# Patient Record
Sex: Male | Born: 1945
Health system: Southern US, Community
[De-identification: ages and names within clinical notes are randomized; demographics above are authoritative.]

## PROBLEM LIST (undated history)

## (undated) DIAGNOSIS — E119 Type 2 diabetes mellitus without complications: Secondary | ICD-10-CM

## (undated) DIAGNOSIS — I10 Essential (primary) hypertension: Secondary | ICD-10-CM

## (undated) DIAGNOSIS — E785 Hyperlipidemia, unspecified: Secondary | ICD-10-CM

## (undated) DIAGNOSIS — Z1211 Encounter for screening for malignant neoplasm of colon: Secondary | ICD-10-CM

## (undated) DIAGNOSIS — F329 Major depressive disorder, single episode, unspecified: Secondary | ICD-10-CM

## (undated) DIAGNOSIS — F32A Depression, unspecified: Secondary | ICD-10-CM

## (undated) DIAGNOSIS — E781 Pure hyperglyceridemia: Secondary | ICD-10-CM

## (undated) DIAGNOSIS — I4891 Unspecified atrial fibrillation: Secondary | ICD-10-CM

## (undated) DIAGNOSIS — Z8489 Family history of other specified conditions: Secondary | ICD-10-CM

## (undated) DIAGNOSIS — U071 COVID-19: Secondary | ICD-10-CM

## (undated) DIAGNOSIS — F419 Anxiety disorder, unspecified: Secondary | ICD-10-CM

## (undated) HISTORY — DX: Essential (primary) hypertension: I10

## (undated) HISTORY — DX: Major depressive disorder, single episode, unspecified: F32.9

## (undated) HISTORY — DX: Depression, unspecified: F32.A

## (undated) HISTORY — PX: APPENDECTOMY: SHX54

## (undated) HISTORY — DX: Type 2 diabetes mellitus without complications: E11.9

## (undated) HISTORY — DX: Pure hyperglyceridemia: E78.1

## (undated) HISTORY — DX: Anxiety disorder, unspecified: F41.9

---
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
## 2001-02-10 ENCOUNTER — Emergency Department (HOSPITAL_COMMUNITY): Admission: EM | Admit: 2001-02-10 | Discharge: 2001-02-10 | Payer: Self-pay | Admitting: Emergency Medicine

## 2004-01-11 ENCOUNTER — Ambulatory Visit (HOSPITAL_COMMUNITY): Admission: RE | Admit: 2004-01-11 | Discharge: 2004-01-11 | Payer: Self-pay | Admitting: Family Medicine

## 2004-01-14 HISTORY — PX: COLONOSCOPY: SHX174

## 2004-02-12 ENCOUNTER — Ambulatory Visit (HOSPITAL_COMMUNITY): Admission: RE | Admit: 2004-02-12 | Discharge: 2004-02-12 | Payer: Self-pay | Admitting: Internal Medicine

## 2004-02-15 ENCOUNTER — Ambulatory Visit (HOSPITAL_COMMUNITY): Admission: RE | Admit: 2004-02-15 | Discharge: 2004-02-15 | Payer: Self-pay | Admitting: Internal Medicine

## 2004-03-16 ENCOUNTER — Ambulatory Visit: Payer: Self-pay | Admitting: Gastroenterology

## 2013-07-09 ENCOUNTER — Other Ambulatory Visit: Payer: Self-pay

## 2013-07-09 ENCOUNTER — Telehealth: Payer: Self-pay

## 2013-07-09 DIAGNOSIS — Z1211 Encounter for screening for malignant neoplasm of colon: Secondary | ICD-10-CM

## 2013-07-09 NOTE — Telephone Encounter (Signed)
Pt will need an OV prior to colonoscopy due to his medications. I called and informed his wife and she will tell him.  His appt is scheduled for 07/25/2013 at 10:00 AM with Neil Crouch, PA.   I called and LMOM for Dorise Hiss at the [facility]  [phone]  X [phone]) that pt will require an office visit first.

## 2013-07-25 ENCOUNTER — Encounter: Payer: Self-pay | Admitting: Gastroenterology

## 2013-07-25 ENCOUNTER — Ambulatory Visit (INDEPENDENT_AMBULATORY_CARE_PROVIDER_SITE_OTHER): Payer: Medicare Other | Admitting: Gastroenterology

## 2013-07-25 VITALS — BP 147/71 | HR 67 | Temp 95.4°F | Ht 71.0 in | Wt 189.8 lb

## 2013-07-25 DIAGNOSIS — Z1211 Encounter for screening for malignant neoplasm of colon: Secondary | ICD-10-CM

## 2013-07-25 NOTE — Progress Notes (Signed)
Primary Care Physician:  Dr. Blane Ohara (through the [facility]) Primary Gastroenterologist:  Dr. Gala Romney   Chief Complaint  Patient presents with  . Colonoscopy    HPI:   Darrell Lang presents today to schedule routine screening colonoscopy. Last procedure in 2005 by Dr. Gala Romney without any polyps noted. Internal hemorrhoids appreciated. Patient has no lower or upper GI concerns. Denies abdominal pain, change in bowel habits, rectal bleeding, melena, dysphagia. Ready to proceed with routine screening colonoscopy. He is on multiple medications for history of anxiety and depression.   Past Medical History  Diagnosis Date  . Hypertension   . Hypertriglyceridemia   . Diabetes   . Anxiety and depression     Past Surgical History  Procedure Laterality Date  . Colonoscopy  Sept 2005    Dr. Gala Romney: internal hemorrhoids, otherwise normal rectum. Normal colon and normal TI  . Appendectomy      Current Outpatient Prescriptions  Medication Sig Dispense Refill  . aspirin 81 MG tablet Take 81 mg by mouth daily.      . benztropine (COGENTIN) 1 MG tablet Take 1 mg by mouth at bedtime.      . citalopram (CELEXA) 20 MG tablet Take 20 mg by mouth daily.      . clonazePAM (KLONOPIN) 0.5 MG tablet Take 0.5 mg by mouth daily. Pt takes 1/2 of a 0.5 mg in the evening      . cloNIDine (CATAPRES) 0.1 MG tablet Take 0.1 mg by mouth daily.      . haloperidol (HALDOL) 2 MG tablet Take 2 mg by mouth every 8 (eight) hours as needed for agitation. Just takes one tablet at bedtime      . hydrochlorothiazide (HYDRODIURIL) 25 MG tablet Take 25 mg by mouth daily.      Marland Kitchen losartan (COZAAR) 100 MG tablet Take 100 mg by mouth daily.      . metFORMIN (GLUMETZA) 1000 MG (MOD) 24 hr tablet Take 1,000 mg by mouth 2 (two) times daily with a meal.      . Multiple Vitamin (MULTIVITAMIN) tablet Take 1 tablet by mouth daily.      Marland Kitchen omeprazole (PRILOSEC) 20 MG capsule Take 20 mg by mouth daily.      . QUEtiapine (SEROQUEL)  200 MG tablet Take 200 mg by mouth at bedtime. Pt takes 1/2 tablet at bedtime      . simvastatin (ZOCOR) 80 MG tablet Take 80 mg by mouth daily. PT takes 1/2 tablet at bedtime       No current facility-administered medications for this visit.    Allergies as of 07/25/2013  . (No Known Allergies)    Family History  Problem Relation Age of Onset  . Colon cancer Neg Hx     History   Social History  . Marital Status: Married    Spouse Name: N/A    Number of Children: N/A  . Years of Education: N/A   Occupational History  . Not on file.   Social History Main Topics  . Smoking status: Former Research scientist (life sciences)  . Smokeless tobacco: Not on file     Comment: Quit smoking in 1987  . Alcohol Use: No     Comment: history of ETOH abuse in remote past, none currently.   . Drug Use: No  . Sexual Activity: Not on file   Other Topics Concern  . Not on file   Social History Narrative  . No narrative on file  Review of Systems: Gen: Denies any fever, chills, fatigue, weight loss, lack of appetite.  CV: Denies chest pain, heart palpitations, peripheral edema, syncope.  Resp: Denies shortness of breath at rest or with exertion. Denies wheezing or cough.  GI: see HPI GU : Denies urinary burning, urinary frequency, urinary hesitancy MS: elbow pain Derm: Denies rash, itching, dry skin Psych: +depression/anxiety Heme: Denies bruising, bleeding, and enlarged lymph nodes.  Physical Exam: BP 147/71  Pulse 67  Temp(Src) 95.4 F (35.2 C) (Oral)  Ht 5\' 11"  (1.803 m)  Wt 189 lb 12.8 oz (86.093 kg)  BMI 26.48 kg/m2 General:   Alert and oriented. Pleasant and cooperative. Well-nourished and well-developed.  Head:  Normocephalic and atraumatic. Eyes:  Without icterus, sclera clear and conjunctiva pink.  Ears:  Normal auditory acuity. Nose:  No deformity, discharge,  or lesions. Mouth:  No deformity or lesions, oral mucosa pink.  Neck:  Supple, without mass or thyromegaly. Lungs:  Clear to  auscultation bilaterally. No wheezes, rales, or rhonchi. No distress.  Heart:  S1, S2 present without murmurs appreciated.  Abdomen:  +BS, soft, non-tender and non-distended. No guarding or rebound. ?hepatomegaly. Difficult to tell due to larger AP diameter. Recent US of abdomen at outside facility: will request.  Rectal:  Deferred  Msk:  Symmetrical without gross deformities. Normal posture. Extremities:  Without clubbing or edema. Neurologic:  Alert and  oriented x4;  grossly normal neurologically. Skin:  Intact without significant lesions or rashes. Cervical Nodes:  No significant cervical adenopathy. Psych:  Alert and cooperative. Normal mood and affect.

## 2013-07-25 NOTE — Patient Instructions (Signed)
We have set you up for a colonoscopy with Dr. Rourk in the near future.  Further recommendations to follow.    

## 2013-07-28 ENCOUNTER — Other Ambulatory Visit: Payer: Self-pay | Admitting: Internal Medicine

## 2013-07-28 ENCOUNTER — Ambulatory Visit: Admit: 2013-07-28 | Payer: Self-pay | Admitting: Internal Medicine

## 2013-07-28 DIAGNOSIS — Z1211 Encounter for screening for malignant neoplasm of colon: Secondary | ICD-10-CM

## 2013-07-28 HISTORY — DX: Encounter for screening for malignant neoplasm of colon: Z12.11

## 2013-07-28 SURGERY — COLONOSCOPY
Anesthesia: Moderate Sedation

## 2013-07-28 NOTE — Assessment & Plan Note (Signed)
68 year old male presenting for routine screening colonoscopy; last in 2005 without evidence of polyps. No FH of colon cancer or concerning lower GI symptoms. Due to several psychotropic medications, it is thought best that he be seen for an office visit prior versus phone triage for procedure. He will likely do well with Phenergan prior to procedure. Due to age, will use 12.5 mg IV on call. As of note, ?hepatomegaly on exam, but it is difficult to assess due to body habitus. Will retrieve outside [place] of abdomen for our records.   Proceed with TCS with Dr. Gala Romney in near future: the risks, benefits, and alternatives have been discussed with the patient in detail. The patient states understanding and desires to proceed. Phenergan 12.5 mg IV on call Retrieve outside [place] of abdomen

## 2013-07-29 NOTE — Progress Notes (Signed)
NO PCP

## 2013-08-04 NOTE — Telephone Encounter (Signed)
Pt was seen in the office on 07/25/2013 for his ov. He is scheduled for his colonoscopy on 08/06/2012 with Dr. Gala Romney. I called Dorise Hiss at the [facility] and informed her of the date of his procedure as she had requested.

## 2013-08-06 ENCOUNTER — Ambulatory Visit (HOSPITAL_COMMUNITY)
Admission: RE | Admit: 2013-08-06 | Discharge: 2013-08-06 | Disposition: A | Discharge status: Home / Self Care | Payer: Non-veteran care | Source: Ambulatory Visit | Attending: Internal Medicine | Admitting: Internal Medicine

## 2013-08-06 ENCOUNTER — Encounter (HOSPITAL_COMMUNITY)
Admission: RE | Disposition: A | Discharge status: Home / Self Care | Payer: Self-pay | Source: Ambulatory Visit | Attending: Internal Medicine

## 2013-08-06 ENCOUNTER — Encounter (HOSPITAL_COMMUNITY): Payer: Self-pay | Admitting: *Deleted

## 2013-08-06 DIAGNOSIS — I10 Essential (primary) hypertension: Secondary | ICD-10-CM | POA: Diagnosis not present

## 2013-08-06 DIAGNOSIS — Z7982 Long term (current) use of aspirin: Secondary | ICD-10-CM | POA: Insufficient documentation

## 2013-08-06 DIAGNOSIS — F3289 Other specified depressive episodes: Secondary | ICD-10-CM | POA: Diagnosis not present

## 2013-08-06 DIAGNOSIS — F411 Generalized anxiety disorder: Secondary | ICD-10-CM | POA: Insufficient documentation

## 2013-08-06 DIAGNOSIS — E781 Pure hyperglyceridemia: Secondary | ICD-10-CM | POA: Diagnosis not present

## 2013-08-06 DIAGNOSIS — Z87891 Personal history of nicotine dependence: Secondary | ICD-10-CM | POA: Insufficient documentation

## 2013-08-06 DIAGNOSIS — Z79899 Other long term (current) drug therapy: Secondary | ICD-10-CM | POA: Insufficient documentation

## 2013-08-06 DIAGNOSIS — Z1211 Encounter for screening for malignant neoplasm of colon: Secondary | ICD-10-CM | POA: Insufficient documentation

## 2013-08-06 DIAGNOSIS — E119 Type 2 diabetes mellitus without complications: Secondary | ICD-10-CM | POA: Diagnosis not present

## 2013-08-06 DIAGNOSIS — F329 Major depressive disorder, single episode, unspecified: Secondary | ICD-10-CM | POA: Diagnosis not present

## 2013-08-06 HISTORY — PX: COLONOSCOPY: SHX5424

## 2013-08-06 LAB — GLUCOSE, CAPILLARY: GLUCOSE-CAPILLARY: 125 mg/dL — AB (ref 70–99)

## 2013-08-06 SURGERY — COLONOSCOPY
Anesthesia: Moderate Sedation

## 2013-08-06 MED ORDER — PROMETHAZINE HCL 25 MG/ML IJ SOLN
12.5000 mg | Freq: Once | INTRAMUSCULAR | Status: AC
  Administered 2013-08-06: 12.5 mg via INTRAVENOUS

## 2013-08-06 MED ORDER — PROMETHAZINE HCL 25 MG/ML IJ SOLN
INTRAMUSCULAR | Status: AC
  Filled 2013-08-06: qty 1

## 2013-08-06 MED ORDER — MEPERIDINE HCL 100 MG/ML IJ SOLN
INTRAMUSCULAR | Status: DC | PRN
  Administered 2013-08-06: 50 mg via INTRAVENOUS
  Administered 2013-08-06 (×2): 25 mg via INTRAVENOUS

## 2013-08-06 MED ORDER — MIDAZOLAM HCL 5 MG/5ML IJ SOLN
INTRAMUSCULAR | Status: AC
  Filled 2013-08-06: qty 10

## 2013-08-06 MED ORDER — MIDAZOLAM HCL 5 MG/5ML IJ SOLN
INTRAMUSCULAR | Status: DC | PRN
  Administered 2013-08-06 (×2): 2 mg via INTRAVENOUS
  Administered 2013-08-06: 1 mg via INTRAVENOUS

## 2013-08-06 MED ORDER — SODIUM CHLORIDE 0.9 % IV SOLN
INTRAVENOUS | Status: DC
  Administered 2013-08-06: 11:00:00 via INTRAVENOUS

## 2013-08-06 MED ORDER — STERILE WATER FOR IRRIGATION IR SOLN
Status: DC | PRN
  Administered 2013-08-06: 11:00:00

## 2013-08-06 MED ORDER — ONDANSETRON HCL 4 MG/2ML IJ SOLN
INTRAMUSCULAR | Status: DC | PRN
  Administered 2013-08-06: 4 mg via INTRAVENOUS

## 2013-08-06 MED ORDER — MEPERIDINE HCL 100 MG/ML IJ SOLN
INTRAMUSCULAR | Status: AC
  Filled 2013-08-06: qty 2

## 2013-08-06 MED ORDER — ONDANSETRON HCL 4 MG/2ML IJ SOLN
INTRAMUSCULAR | Status: DC
Start: 2013-08-06 — End: 2013-08-06
  Filled 2013-08-06: qty 2

## 2013-08-06 MED ORDER — SODIUM CHLORIDE 0.9 % IJ SOLN
INTRAMUSCULAR | Status: AC
  Filled 2013-08-06: qty 10

## 2013-08-06 NOTE — Discharge Instructions (Signed)
°  Colonoscopy Discharge Instructions  Read the instructions outlined below and refer to this sheet in the next few weeks. These discharge instructions provide you with general information on caring for yourself after you leave the hospital. Your doctor may also give you specific instructions. While your treatment has been planned according to the most current medical practices available, unavoidable complications occasionally occur. If you have any problems or questions after discharge, call Dr. Gala Romney at [phone]. ACTIVITY  You may resume your regular activity, but move at a slower pace for the next 24 hours.   Take frequent rest periods for the next 24 hours.   Walking will help get rid of the air and reduce the bloated feeling in your belly (abdomen).   No driving for 24 hours (because of the medicine (anesthesia) used during the test).    Do not sign any important legal documents or operate any machinery for 24 hours (because of the anesthesia used during the test).  NUTRITION  Drink plenty of fluids.   You may resume your normal diet as instructed by your doctor.   Begin with a light meal and progress to your normal diet. Heavy or fried foods are harder to digest and may make you feel sick to your stomach (nauseated).   Avoid alcoholic beverages for 24 hours or as instructed.  MEDICATIONS  You may resume your normal medications unless your doctor tells you otherwise.  WHAT YOU CAN EXPECT TODAY  Some feelings of bloating in the abdomen.   Passage of more gas than usual.   Spotting of blood in your stool or on the toilet paper.  IF YOU HAD POLYPS REMOVED DURING THE COLONOSCOPY:  No aspirin products for 7 days or as instructed.   No alcohol for 7 days or as instructed.   Eat a soft diet for the next 24 hours.  FINDING OUT THE RESULTS OF YOUR TEST Not all test results are available during your visit. If your test results are not back during the visit, make an appointment  with your caregiver to find out the results. Do not assume everything is normal if you have not heard from your caregiver or the medical facility. It is important for you to follow up on all of your test results.  SEEK IMMEDIATE MEDICAL ATTENTION IF:  You have more than a spotting of blood in your stool.   Your belly is swollen (abdominal distention).   You are nauseated or vomiting.   You have a temperature over 101.   You have abdominal pain or discomfort that is severe or gets worse throughout the day.     Get repeat screening colonoscopy in 10 years

## 2013-08-06 NOTE — Interval H&P Note (Signed)
History and Physical Interval Note:  08/06/2013 10:52 AM  Darrell Lang  has presented today for surgery, with the diagnosis of SCREENING COLONOSCOPY  The various methods of treatment have been discussed with the patient and family. After consideration of risks, benefits and other options for treatment, the patient has consented to  Procedure(s) with comments: COLONOSCOPY (N/A) - 10:15 as a surgical intervention .  The patient's history has been reviewed, patient examined, no change in status, stable for surgery.  I have reviewed the patient's chart and labs.  Questions were answered to the patient's satisfaction.     No change. Colonoscopy per plan.The risks, benefits, limitations, alternatives and imponderables have been reviewed with the patient. Questions have been answered. All parties are agreeable.   Manus Rudd

## 2013-08-06 NOTE — H&P (View-Only) (Signed)
Primary Care Physician:  Dr. Blane Ohara (through the [facility]) Primary Gastroenterologist:  Dr. Gala Romney   Chief Complaint  Patient presents with  . Colonoscopy    HPI:   Darrell Lang presents today to schedule routine screening colonoscopy. Last procedure in 2005 by Dr. Gala Romney without any polyps noted. Internal hemorrhoids appreciated. Patient has no lower or upper GI concerns. Denies abdominal pain, change in bowel habits, rectal bleeding, melena, dysphagia. Ready to proceed with routine screening colonoscopy. He is on multiple medications for history of anxiety and depression.   Past Medical History  Diagnosis Date  . Hypertension   . Hypertriglyceridemia   . Diabetes   . Anxiety and depression     Past Surgical History  Procedure Laterality Date  . Colonoscopy  Sept 2005    Dr. Gala Romney: internal hemorrhoids, otherwise normal rectum. Normal colon and normal TI  . Appendectomy      Current Outpatient Prescriptions  Medication Sig Dispense Refill  . aspirin 81 MG tablet Take 81 mg by mouth daily.      . benztropine (COGENTIN) 1 MG tablet Take 1 mg by mouth at bedtime.      . citalopram (CELEXA) 20 MG tablet Take 20 mg by mouth daily.      . clonazePAM (KLONOPIN) 0.5 MG tablet Take 0.5 mg by mouth daily. Pt takes 1/2 of a 0.5 mg in the evening      . cloNIDine (CATAPRES) 0.1 MG tablet Take 0.1 mg by mouth daily.      . haloperidol (HALDOL) 2 MG tablet Take 2 mg by mouth every 8 (eight) hours as needed for agitation. Just takes one tablet at bedtime      . hydrochlorothiazide (HYDRODIURIL) 25 MG tablet Take 25 mg by mouth daily.      Marland Kitchen losartan (COZAAR) 100 MG tablet Take 100 mg by mouth daily.      . metFORMIN (GLUMETZA) 1000 MG (MOD) 24 hr tablet Take 1,000 mg by mouth 2 (two) times daily with a meal.      . Multiple Vitamin (MULTIVITAMIN) tablet Take 1 tablet by mouth daily.      Marland Kitchen omeprazole (PRILOSEC) 20 MG capsule Take 20 mg by mouth daily.      . QUEtiapine (SEROQUEL)  200 MG tablet Take 200 mg by mouth at bedtime. Pt takes 1/2 tablet at bedtime      . simvastatin (ZOCOR) 80 MG tablet Take 80 mg by mouth daily. PT takes 1/2 tablet at bedtime       No current facility-administered medications for this visit.    Allergies as of 07/25/2013  . (No Known Allergies)    Family History  Problem Relation Age of Onset  . Colon cancer Neg Hx     History   Social History  . Marital Status: Married    Spouse Name: N/A    Number of Children: N/A  . Years of Education: N/A   Occupational History  . Not on file.   Social History Main Topics  . Smoking status: Former Research scientist (life sciences)  . Smokeless tobacco: Not on file     Comment: Quit smoking in 1987  . Alcohol Use: No     Comment: history of ETOH abuse in remote past, none currently.   . Drug Use: No  . Sexual Activity: Not on file   Other Topics Concern  . Not on file   Social History Narrative  . No narrative on file  Review of Systems: Gen: Denies any fever, chills, fatigue, weight loss, lack of appetite.  CV: Denies chest pain, heart palpitations, peripheral edema, syncope.  Resp: Denies shortness of breath at rest or with exertion. Denies wheezing or cough.  GI: see HPI GU : Denies urinary burning, urinary frequency, urinary hesitancy MS: elbow pain Derm: Denies rash, itching, dry skin Psych: +depression/anxiety Heme: Denies bruising, bleeding, and enlarged lymph nodes.  Physical Exam: BP 147/71  Pulse 67  Temp(Src) 95.4 F (35.2 C) (Oral)  Ht 5\' 11"  (1.803 m)  Wt 189 lb 12.8 oz (86.093 kg)  BMI 26.48 kg/m2 General:   Alert and oriented. Pleasant and cooperative. Well-nourished and well-developed.  Head:  Normocephalic and atraumatic. Eyes:  Without icterus, sclera clear and conjunctiva pink.  Ears:  Normal auditory acuity. Nose:  No deformity, discharge,  or lesions. Mouth:  No deformity or lesions, oral mucosa pink.  Neck:  Supple, without mass or thyromegaly. Lungs:  Clear to  auscultation bilaterally. No wheezes, rales, or rhonchi. No distress.  Heart:  S1, S2 present without murmurs appreciated.  Abdomen:  +BS, soft, non-tender and non-distended. No guarding or rebound. ?hepatomegaly. Difficult to tell due to larger AP diameter. Recent US of abdomen at outside facility: will request.  Rectal:  Deferred  Msk:  Symmetrical without gross deformities. Normal posture. Extremities:  Without clubbing or edema. Neurologic:  Alert and  oriented x4;  grossly normal neurologically. Skin:  Intact without significant lesions or rashes. Cervical Nodes:  No significant cervical adenopathy. Psych:  Alert and cooperative. Normal mood and affect.

## 2013-08-06 NOTE — Op Note (Signed)
[facility] [address] [place], [address]   COLONOSCOPY PROCEDURE REPORT  PATIENT: Darrell, Lang  MR#:         [id number] BIRTHDATE: August 31, 1945 , 20  yrs. old GENDER: Male ENDOSCOPIST: R.  Garfield Cornea, MD Quentin Ore REFERRED BY:     [facility] PROCEDURE DATE:  08/06/2013 PROCEDURE:     Screening colonoscopy  INDICATIONS: Average risk colorectal cancer screening examination  INFORMED CONSENT:  The risks, benefits, alternatives and imponderables including but not limited to bleeding, perforation as well as the possibility of a missed lesion have been reviewed.  The potential for biopsy, lesion removal, etc. have also been discussed.  Questions have been answered.  All parties agreeable. Please see the history and physical in the medical record for more information.  MEDICATIONS: Versed 5 mg IV and Demerol 100 mg IV in divided doses. Phenergan 12.5 mg IV. Zofran 4 mg IV.  DESCRIPTION OF PROCEDURE:  After a digital rectal exam was performed, the EC-[id number] ([id number])  colonoscope was advanced from the anus through the rectum and colon to the area of the cecum, ileocecal valve and appendiceal orifice.  The cecum was deeply intubated.  These structures were well-seen and photographed for the record.  From the level of the cecum and ileocecal valve, the scope was slowly and cautiously withdrawn.  The mucosal surfaces were carefully surveyed utilizing scope tip deflection to facilitate fold flattening as needed.  The scope was pulled down into the rectum where a thorough examination including retroflexion was performed.    FINDINGS:  Adequate preparation. Normal rectum (internal hemorrhoids); normal appearing colonic mucosa.  THERAPEUTIC / DIAGNOSTIC MANEUVERS PERFORMED:  None  COMPLICATIONS: None  CECAL WITHDRAWAL TIME:  7 minutes  IMPRESSION:  Normal colonoscopy  RECOMMENDATIONS: Repeat colonoscopy for screening  purposes in 10 years   _______________________________ eSigned:  R. Garfield Cornea, MD FACP [facility] 08/06/2013 11:39 AM   CC:

## 2013-08-12 ENCOUNTER — Encounter (HOSPITAL_COMMUNITY): Payer: Self-pay | Admitting: Internal Medicine

## 2013-08-26 NOTE — Progress Notes (Signed)
I received outside imaging for AAA dated July 21, 2013. However, this is only for AAA surveillance and not evaluation of liver. Can we set up an [place] of abdomen due to ? Hepatomegaly?

## 2013-08-27 ENCOUNTER — Other Ambulatory Visit: Payer: Self-pay | Admitting: Gastroenterology

## 2013-08-27 DIAGNOSIS — R160 Hepatomegaly, not elsewhere classified: Secondary | ICD-10-CM

## 2013-08-27 NOTE — Progress Notes (Signed)
I have him scheduled for Abd U/S Tuesday April 21 at 8:00 am NPO after midnight, but he wants to talk to you Darrell Lang before having it done

## 2013-08-27 NOTE — Progress Notes (Signed)
I called an cancelled U/S for now I have to have the [place] approve it before he can have it

## 2013-08-28 NOTE — Progress Notes (Signed)
Darrell Lang from the [facility] called and stated that the Abdominal U/S would NOT be approved because it could be preformed at the [place]

## 2013-09-02 ENCOUNTER — Other Ambulatory Visit (HOSPITAL_COMMUNITY): Payer: PRIVATE HEALTH INSURANCE

## 2013-09-18 NOTE — Progress Notes (Signed)
Noted  

## 2013-09-18 NOTE — Progress Notes (Signed)
If we can, let's have him proceed with [place] of abdomen at the [place].

## 2019-01-13 ENCOUNTER — Ambulatory Visit
Admission: EM | Admit: 2019-01-13 | Discharge: 2019-01-13 | Disposition: A | Discharge status: Home / Self Care | Payer: Non-veteran care

## 2019-01-13 ENCOUNTER — Other Ambulatory Visit: Payer: Self-pay

## 2019-01-13 DIAGNOSIS — L600 Ingrowing nail: Secondary | ICD-10-CM

## 2019-01-13 MED ORDER — DOXYCYCLINE HYCLATE 100 MG PO CAPS: 100.0000 mg | ORAL_CAPSULE | Freq: Two times a day (BID) | ORAL | 0 refills | Status: DC

## 2019-01-13 NOTE — ED Triage Notes (Signed)
Pt has toe pain in left great toe, pt has hammer toe  And after rubbing shoe has become infected

## 2019-01-13 NOTE — ED Provider Notes (Signed)
Darrell Lang   [id number] 01/13/19 Arrival Time: [id number]  CC: Left great toe  SUBJECTIVE: History from: patient. Darrell Lang is a 73 y.o. male hx significant for anxiety and depression, DM, HTN, and hypertriglyceridemia, complains of left great toe pain and redness that began 1 week ago.  Denies a precipitating event or specific injury.  Localizes the pain to the the left great toe.  Has tried soaking foot in epsom salt, with minimal relief.  Symptoms are made worse to the touch.  Denies similar symptoms in the past.  Complains of associated redness.  Denies fever, chills, nausea, vomiting, chest pain, SOB, swelling, numbness/ tingling.    Patient hx significant for DM.  Checked sugar today and was 200.    ROS: As per HPI.  All other pertinent ROS negative.     Past Medical History:  Diagnosis Date  . Anxiety and depression   . Diabetes ([place])   . Hypertension   . Hypertriglyceridemia    Past Surgical History:  Procedure Laterality Date  . APPENDECTOMY    . COLONOSCOPY  Sept 2005   Dr. Gala Romney: internal hemorrhoids, otherwise normal rectum. Normal colon and normal TI  . COLONOSCOPY N/A 08/06/2013   Procedure: COLONOSCOPY;  Surgeon: Daneil Dolin, MD;  Location: [facility];  Service: Endoscopy;  Laterality: N/A;  10:15   No Known Allergies No current facility-administered medications on file prior to encounter.    Current Outpatient Medications on File Prior to Encounter  Medication Sig Dispense Refill  . risperiDONE (RISPERDAL) 1 MG tablet Take 1 mg by mouth at bedtime.    Marland Kitchen aspirin 81 MG tablet Take 81 mg by mouth daily.    . benztropine (COGENTIN) 1 MG tablet Take 1 mg by mouth at bedtime.    . citalopram (CELEXA) 20 MG tablet Take 20 mg by mouth daily.    . clonazePAM (KLONOPIN) 0.5 MG tablet Take 0.5 mg by mouth daily. Pt takes 1/2 of a 0.5 mg in the evening    . cloNIDine (CATAPRES) 0.1 MG tablet Take 0.1 mg by mouth daily.    . hydrochlorothiazide  (HYDRODIURIL) 25 MG tablet Take 25 mg by mouth daily.    Marland Kitchen losartan (COZAAR) 100 MG tablet Take 100 mg by mouth daily.    . metFORMIN (GLUMETZA) 1000 MG (MOD) 24 hr tablet Take 1,000 mg by mouth 2 (two) times daily with a meal.    . Multiple Vitamin (MULTIVITAMIN) tablet Take 1 tablet by mouth daily.    Marland Kitchen omeprazole (PRILOSEC) 20 MG capsule Take 20 mg by mouth daily.    . QUEtiapine (SEROQUEL) 200 MG tablet Take 200 mg by mouth at bedtime. Pt takes 1/2 tablet at bedtime    . simvastatin (ZOCOR) 80 MG tablet Take 80 mg by mouth daily. PT takes 1/2 tablet at bedtime    . [DISCONTINUED] haloperidol (HALDOL) 2 MG tablet Take 2 mg by mouth every 8 (eight) hours as needed for agitation. Just takes one tablet at bedtime     Social History   Socioeconomic History  . Marital status: Married    Spouse name: Not on file  . Number of children: Not on file  . Years of education: Not on file  . Highest education level: Not on file  Occupational History  . Not on file  Social Needs  . Financial resource strain: Not on file  . Food insecurity    Worry: Not on file    Inability: Not on file  .  Transportation needs    Medical: Not on file    Non-medical: Not on file  Tobacco Use  . Smoking status: Former Smoker    Packs/day: 0.00    Years: 26.00    Pack years: 0.00    Types: Cigarettes  . Tobacco comment: Quit smoking in 1986  Substance and Sexual Activity  . Alcohol use: No    Comment: history of ETOH abuse in remote past, none currently.   . Drug use: No  . Sexual activity: Not on file  Lifestyle  . Physical activity    Days per week: Not on file    Minutes per session: Not on file  . Stress: Not on file  Relationships  . Social Herbalist on phone: Not on file    Gets together: Not on file    Attends religious service: Not on file    Active member of club or organization: Not on file    Attends meetings of clubs or organizations: Not on file    Relationship status: Not  on file  . Intimate partner violence    Fear of current or ex partner: Not on file    Emotionally abused: Not on file    Physically abused: Not on file    Forced sexual activity: Not on file  Other Topics Concern  . Not on file  Social History Narrative  . Not on file   Family History  Problem Relation Age of Onset  . Colon cancer Neg Hx     OBJECTIVE:  Vitals:   01/13/19 1722  BP: (!) 156/83  Pulse: 81  Resp: 20  Temp: 98 F (36.7 C)  SpO2: 94%    General appearance: ALERT; in no acute distress.  Head: NCAT Lungs: Normal respiratory effort CV: Posterior tibialis pulse 2+ and intact; cap refill <2 seconds Musculoskeletal: Left foot Inspection: Ingrown toenail present; erythematous over the medial and lateral aspect of the nailfold; no abscess present; no discharge or bleeding Palpation: TTP over distal left great toe ROM: FROM active and passive Strength: 5/5 dorsiflexion, 5/5 plantar flexion Skin: warm and dry Neurologic: Ambulates without difficulty; Sensation intact about the lower extremities Psychological: alert and cooperative; normal mood and affect  ASSESSMENT & PLAN:  1. Ingrown toenail of left foot with infection     Meds ordered this encounter  Medications  . doxycycline (VIBRAMYCIN) 100 MG capsule    Sig: Take 1 capsule (100 mg total) by mouth 2 (two) times daily.    Dispense:  20 capsule    Refill:  0    Order Specific Question:   Supervising Provider    Answer:   Raylene Everts [id number]   Will treat for possible infection Soak foot in warm, soapy water for 10-20 minutes three times a day for one to two weeks, pushing lateral nail fold away from nail plate You may also try placing a cotton wedging or dental floss underneath the lateral nail plate to separate the nail plate form the lateral nail fold Follow up with podiatry for further evaluation and management.  You may need to have your toenail removed.   Return or go to the ED if you have  any new or worsening symptoms such as fever, chills, nausea, vomiting, increased swelling, redness, pain, symptoms do not improve with antibiotics, etc...  Reviewed expectations re: course of current medical issues. Questions answered. Outlined signs and symptoms indicating need for more acute intervention. Patient verbalized understanding. After Visit Summary  given.    Lestine Box, PA-C 01/13/19 1945

## 2019-01-13 NOTE — Discharge Instructions (Addendum)
Will treat for possible infection Soak foot in warm, soapy water for 10-20 minutes three times a day for one to two weeks, pushing lateral nail fold away from nail plate You may also try placing a cotton wedging or dental floss underneath the lateral nail plate to separate the nail plate form the lateral nail fold Follow up with podiatry for further evaluation and management.  You may need to have your toenail removed.   Return or go to the ED if you have any new or worsening symptoms such as fever, chills, nausea, vomiting, increased swelling, redness, pain, symptoms do not improve with antibiotics, etc..Marland Kitchen

## 2019-02-06 ENCOUNTER — Other Ambulatory Visit: Payer: Self-pay | Admitting: *Deleted

## 2019-02-06 DIAGNOSIS — Z20822 Contact with and (suspected) exposure to covid-19: Secondary | ICD-10-CM

## 2019-02-07 LAB — NOVEL CORONAVIRUS, NAA: SARS-CoV-2, NAA: DETECTED — AB

## 2019-02-08 ENCOUNTER — Inpatient Hospital Stay (HOSPITAL_COMMUNITY): Payer: No Typology Code available for payment source

## 2019-02-08 ENCOUNTER — Other Ambulatory Visit: Payer: Self-pay

## 2019-02-08 ENCOUNTER — Inpatient Hospital Stay (HOSPITAL_COMMUNITY)
Admission: EM | Admit: 2019-02-08 | Discharge: 2019-02-13 | DRG: 871 | Disposition: A | Discharge status: Home / Self Care | Payer: No Typology Code available for payment source | Attending: Internal Medicine | Admitting: Internal Medicine

## 2019-02-08 ENCOUNTER — Emergency Department (HOSPITAL_COMMUNITY): Payer: No Typology Code available for payment source

## 2019-02-08 ENCOUNTER — Encounter (HOSPITAL_COMMUNITY): Payer: Self-pay | Admitting: Emergency Medicine

## 2019-02-08 DIAGNOSIS — J9601 Acute respiratory failure with hypoxia: Secondary | ICD-10-CM | POA: Diagnosis present

## 2019-02-08 DIAGNOSIS — T380X5A Adverse effect of glucocorticoids and synthetic analogues, initial encounter: Secondary | ICD-10-CM | POA: Diagnosis not present

## 2019-02-08 DIAGNOSIS — F329 Major depressive disorder, single episode, unspecified: Secondary | ICD-10-CM | POA: Diagnosis present

## 2019-02-08 DIAGNOSIS — Z7982 Long term (current) use of aspirin: Secondary | ICD-10-CM

## 2019-02-08 DIAGNOSIS — R0602 Shortness of breath: Secondary | ICD-10-CM

## 2019-02-08 DIAGNOSIS — D696 Thrombocytopenia, unspecified: Secondary | ICD-10-CM | POA: Diagnosis present

## 2019-02-08 DIAGNOSIS — E1165 Type 2 diabetes mellitus with hyperglycemia: Secondary | ICD-10-CM | POA: Diagnosis not present

## 2019-02-08 DIAGNOSIS — J1289 Other viral pneumonia: Secondary | ICD-10-CM | POA: Diagnosis present

## 2019-02-08 DIAGNOSIS — Z823 Family history of stroke: Secondary | ICD-10-CM | POA: Diagnosis not present

## 2019-02-08 DIAGNOSIS — U071 COVID-19: Secondary | ICD-10-CM | POA: Diagnosis present

## 2019-02-08 DIAGNOSIS — Z23 Encounter for immunization: Secondary | ICD-10-CM | POA: Diagnosis not present

## 2019-02-08 DIAGNOSIS — I10 Essential (primary) hypertension: Secondary | ICD-10-CM | POA: Diagnosis present

## 2019-02-08 DIAGNOSIS — Z87891 Personal history of nicotine dependence: Secondary | ICD-10-CM

## 2019-02-08 DIAGNOSIS — F419 Anxiety disorder, unspecified: Secondary | ICD-10-CM | POA: Diagnosis present

## 2019-02-08 DIAGNOSIS — G92 Toxic encephalopathy: Secondary | ICD-10-CM | POA: Diagnosis not present

## 2019-02-08 DIAGNOSIS — E781 Pure hyperglyceridemia: Secondary | ICD-10-CM | POA: Diagnosis present

## 2019-02-08 DIAGNOSIS — K219 Gastro-esophageal reflux disease without esophagitis: Secondary | ICD-10-CM | POA: Diagnosis present

## 2019-02-08 DIAGNOSIS — E119 Type 2 diabetes mellitus without complications: Secondary | ICD-10-CM

## 2019-02-08 DIAGNOSIS — Z7984 Long term (current) use of oral hypoglycemic drugs: Secondary | ICD-10-CM

## 2019-02-08 DIAGNOSIS — H9190 Unspecified hearing loss, unspecified ear: Secondary | ICD-10-CM | POA: Diagnosis present

## 2019-02-08 DIAGNOSIS — I4891 Unspecified atrial fibrillation: Secondary | ICD-10-CM | POA: Diagnosis present

## 2019-02-08 DIAGNOSIS — A4189 Other specified sepsis: Secondary | ICD-10-CM | POA: Diagnosis present

## 2019-02-08 DIAGNOSIS — E871 Hypo-osmolality and hyponatremia: Secondary | ICD-10-CM | POA: Diagnosis present

## 2019-02-08 DIAGNOSIS — E785 Hyperlipidemia, unspecified: Secondary | ICD-10-CM | POA: Diagnosis present

## 2019-02-08 HISTORY — DX: Unspecified atrial fibrillation: I48.91

## 2019-02-08 LAB — COMPREHENSIVE METABOLIC PANEL
ALT: 25 U/L (ref 0–44)
AST: 36 U/L (ref 15–41)
Albumin: 4 g/dL (ref 3.5–5.0)
Alkaline Phosphatase: 60 U/L (ref 38–126)
Anion gap: 12 (ref 5–15)
BUN: 12 mg/dL (ref 8–23)
CO2: 22 mmol/L (ref 22–32)
Calcium: 8.4 mg/dL — ABNORMAL LOW (ref 8.9–10.3)
Chloride: 99 mmol/L (ref 98–111)
Creatinine, Ser: 1.07 mg/dL (ref 0.61–1.24)
GFR calc Af Amer: >60 mL/min (ref >60)
GFR calc non Af Amer: >60 mL/min (ref >60)
Glucose, Bld: 139 mg/dL — ABNORMAL HIGH (ref 70–99)
Potassium: 3.9 mmol/L (ref 3.5–5.1)
Sodium: 133 mmol/L — ABNORMAL LOW (ref 135–145)
Total Bilirubin: 0.8 mg/dL (ref 0.3–1.2)
Total Protein: 7.5 g/dL (ref 6.5–8.1)

## 2019-02-08 LAB — CBC WITH DIFFERENTIAL/PLATELET
Abs Immature Granulocytes: 0.01 10*3/uL (ref 0.00–0.07)
Basophils Absolute: 0 10*3/uL (ref 0.0–0.1)
Basophils Relative: 0 %
Eosinophils Absolute: 0 10*3/uL (ref 0.0–0.5)
Eosinophils Relative: 0 %
HCT: 43.4 % (ref 39.0–52.0)
Hemoglobin: 14 g/dL (ref 13.0–17.0)
Immature Granulocytes: 0 %
Lymphocytes Relative: 17 %
Lymphs Abs: 0.8 10*3/uL (ref 0.7–4.0)
MCH: 28.5 pg (ref 26.0–34.0)
MCHC: 32.3 g/dL (ref 30.0–36.0)
MCV: 88.2 fL (ref 80.0–100.0)
Monocytes Absolute: 0.6 10*3/uL (ref 0.1–1.0)
Monocytes Relative: 12 %
Neutro Abs: 3.2 10*3/uL (ref 1.7–7.7)
Neutrophils Relative %: 71 %
Platelets: 145 10*3/uL — ABNORMAL LOW (ref 150–400)
RBC: 4.92 MIL/uL (ref 4.22–5.81)
RDW: 13.6 % (ref 11.5–15.5)
WBC: 4.5 10*3/uL (ref 4.0–10.5)
nRBC: 0 % (ref 0.0–0.2)

## 2019-02-08 LAB — CBG MONITORING, ED: Glucose-Capillary: 175 mg/dL — ABNORMAL HIGH (ref 70–99)

## 2019-02-08 LAB — TROPONIN I (HIGH SENSITIVITY)
Troponin I (High Sensitivity): 19 ng/L — ABNORMAL HIGH (ref <18)
Troponin I (High Sensitivity): 17 ng/L (ref <18)

## 2019-02-08 LAB — BRAIN NATRIURETIC PEPTIDE: B Natriuretic Peptide: 87 pg/mL (ref 0.0–100.0)

## 2019-02-08 LAB — D-DIMER, QUANTITATIVE: D-Dimer, Quant: 0.63 ug/mL-FEU — ABNORMAL HIGH (ref 0.00–0.50)

## 2019-02-08 MED ORDER — HEPARIN (PORCINE) 25000 UT/250ML-% IV SOLN
1300.0000 [IU]/h | INTRAVENOUS | Status: DC
  Administered 2019-02-09: 01:00:00 1300 [IU]/h via INTRAVENOUS
  Filled 2019-02-08: qty 250

## 2019-02-08 MED ORDER — METHYLPREDNISOLONE SODIUM SUCC 125 MG IJ SOLR
125.0000 mg | Freq: Once | INTRAMUSCULAR | Status: AC
  Administered 2019-02-08: 21:00:00 125 mg via INTRAVENOUS
  Filled 2019-02-08: qty 2

## 2019-02-08 MED ORDER — INSULIN ASPART 100 UNIT/ML ~~LOC~~ SOLN
0.0000 [IU] | SUBCUTANEOUS | Status: DC
  Administered 2019-02-08: 2 [IU] via SUBCUTANEOUS
  Administered 2019-02-09: 12:00:00 5 [IU] via SUBCUTANEOUS
  Administered 2019-02-09 (×2): 2 [IU] via SUBCUTANEOUS
  Filled 2019-02-08: qty 1

## 2019-02-08 MED ORDER — IOHEXOL 350 MG/ML SOLN
75.0000 mL | Freq: Once | INTRAVENOUS | Status: AC | PRN
  Administered 2019-02-08: 75 mL via INTRAVENOUS

## 2019-02-08 MED ORDER — HEPARIN BOLUS VIA INFUSION
4000.0000 [IU] | Freq: Once | INTRAVENOUS | Status: AC
  Administered 2019-02-09: 4000 [IU] via INTRAVENOUS

## 2019-02-08 MED ORDER — DEXTROSE 5 % IV SOLN
5.0000 mg/h | INTRAVENOUS | Status: DC
  Administered 2019-02-08: 5 mg/h via INTRAVENOUS
  Filled 2019-02-08: qty 100

## 2019-02-08 NOTE — ED Triage Notes (Signed)
Pt arrives to the ED c/o of increased SHOB. Pt recently dx with COVID earlier this week. Pt has no other complaints at this time.

## 2019-02-08 NOTE — H&P (Addendum)
TRH H&P    Patient Demographics:    Darrell Lang, is a 73 y.o. male  MRN: [id number]  DOB - 09-Dec-1945  Admit Date - 02/08/2019  Referring MD/NP/PA: Denton Meek    Outpatient Primary MD for the patient is System, Pcp Not In  Patient coming from:  home  Chief complaint-  dyspnea   HPI:    Darrell Lang  is a 73 y.o. male,  w hypertension, hyperlipidemia, Dm2, who presents w c/o fever since early last week. Pt notes slight dry cough. Pt has had increase in dyspnea.  Pt presented w his wife due to Covid-97.  Pt denies alteration in sense of taste or smell.  No diarrhea. Pt denies cp, palp, n/v, abd pain, constipation, brbpr, black stool, dysuria.  In ED,  T 98.9, P 105, R 12, R 23, Bp 154/67  Pox 95% on RA Wt 85.3kg  CTA chest IMPRESSION: 1. No pulmonary embolus. 2. Multifocal peripheral and basilar predominant ground-glass opacities in both lungs, pattern consistent with COVID-19 pneumonia. 3. Mild central bronchial thickening with retained mucus in the trachea and mainstem bronchi. 4. Questionable mild pulmonary edema at the bases septal thickening. 5. Mildly enlarged bilateral hilar lymph nodes are likely reactive, but nonspecific. Adenopathy in the setting of COVID-19 is unusual, and may suggest superimposed bacterial infection.  Wbc 4.5, Hgb 14.0, Plt 145 Na 133, K 3.9, Bun 12, Creatinine 1.07 Ast 36, Alt 25 Trop 19 D dimer 0.63 LDH 232 procalcitonin  <0.1  BNP 87  Pt will be admitted for Covid -19 infection.    Review of systems:    In addition to the HPI above,   No Headache, No changes with Vision or hearing, No problems swallowing food or Liquids, No Chest pain,  No Abdominal pain, No Nausea or Vomiting, bowel movements are regular, No Blood in stool or Urine, No dysuria, No new skin rashes or bruises, No new joints pains-aches,  No new weakness, tingling, numbness in  any extremity, No recent weight gain or loss, No polyuria, polydypsia or polyphagia, No significant Mental Stressors.  All other systems reviewed and are negative.    Past History of the following :    Past Medical History:  Diagnosis Date   Anxiety and depression    Diabetes ([place])    Hypertension    Hypertriglyceridemia       Past Surgical History:  Procedure Laterality Date   APPENDECTOMY     COLONOSCOPY  Sept 2005   Dr. Gala Romney: internal hemorrhoids, otherwise normal rectum. Normal colon and normal TI   COLONOSCOPY N/A 08/06/2013   Procedure: COLONOSCOPY;  Surgeon: Daneil Dolin, MD;  Location: [facility];  Service: Endoscopy;  Laterality: N/A;  10:15      Social History:      Social History   Tobacco Use   Smoking status: Former Smoker    Packs/day: 0.00    Years: 26.00    Pack years: 0.00    Types: Cigarettes   Smokeless tobacco: Never Used   Tobacco comment: Quit  smoking in 1986  Substance Use Topics   Alcohol use: No    Comment: history of ETOH abuse in remote past, none currently.        Family History :     Family History  Problem Relation Age of Onset   Stroke Father    Colon cancer Neg Hx        Home Medications:   Prior to Admission medications   Medication Sig Start Date End Date Taking? Authorizing Provider  aspirin 81 MG tablet Take 81 mg by mouth daily.   Yes [provider]  benztropine (COGENTIN) 1 MG tablet Take 1 mg by mouth at bedtime.   Yes [provider]  citalopram (CELEXA) 20 MG tablet Take 20 mg by mouth daily.   Yes [provider]  clonazePAM (KLONOPIN) 0.5 MG tablet Take 0.5 mg by mouth 2 (two) times daily.    Yes [provider]  cloNIDine (CATAPRES) 0.1 MG tablet Take 0.1 mg by mouth daily.   Yes [provider]  Cyanocobalamin (B-12 PO) Take 1 tablet by mouth daily.   Yes [provider]  hydrochlorothiazide (HYDRODIURIL) 25 MG tablet Take 25 mg by  mouth daily.   Yes [provider]  losartan (COZAAR) 100 MG tablet Take 100 mg by mouth daily.   Yes [provider]  metFORMIN (GLUCOPHAGE) 1000 MG tablet Take 1,000 mg by mouth 2 (two) times daily with a meal.   Yes [provider]  Multiple Vitamin (MULTIVITAMIN) tablet Take 1 tablet by mouth daily.   Yes [provider]  mupirocin ointment (BACTROBAN) 2 % Apply 1 application topically every evening. Applied to affected toe 01/30/19  Yes [provider]  omeprazole (PRILOSEC) 20 MG capsule Take 20 mg by mouth daily.   Yes [provider]  QUEtiapine (SEROQUEL) 200 MG tablet Take 100 mg by mouth at bedtime.    Yes [provider]  risperiDONE (RISPERDAL) 1 MG tablet Take 1 mg by mouth at bedtime.   Yes [provider]  simvastatin (ZOCOR) 80 MG tablet Take 40 mg by mouth at bedtime.    Yes [provider]  doxycycline (VIBRAMYCIN) 100 MG capsule Take 1 capsule (100 mg total) by mouth 2 (two) times daily. Patient not taking: Reported on 02/08/2019 01/13/19   [place], [place], PA-C  haloperidol (HALDOL) 2 MG tablet Take 2 mg by mouth every 8 (eight) hours as needed for agitation. Just takes one tablet at bedtime  01/13/19  [provider]     Allergies:    No Known Allergies   Physical Exam:   Vitals  Blood pressure (!) 144/64, pulse (!) 103, temperature 98.2 F (36.8 C), temperature source Oral, resp. rate (!) 24, height _0  (1.803 m), weight 85.3 kg, SpO2 94 %.  1.  General: axoxo3  2. Psychiatric: euthymic  3. Neurologic: cn2-12 intact, reflexes 2+ symmetric, diffuse with no clonus, motor 5/5 in all 4 ext  4. HEENMT:  Anicteric, pupils 1.58m symmetric, direct, consensual, near intact Neck: no jvd  5. Respiratory : Slight crackles right lung base, no wheezing  6. Cardiovascular : Irr, irr, s1, s2, no m/g/r  7. Gastrointestinal:  Abd: soft, nt, nd, +bs  8. Skin:  Ext: no  c/c/e  9.Musculoskeletal:  Good ROM    Data Review:    CBC Recent Labs  Lab 02/08/19 2053  WBC 4.5  HGB 14.0  HCT 43.4  PLT 145*  MCV 88.2  MCH 28.5  MCHC 32.3  RDW 13.6  LYMPHSABS 0.8  MONOABS 0.6  EOSABS 0.0  BASOSABS 0.0   ------------------------------------------------------------------------------------------------------------------  Results for orders placed or performed during the hospital encounter of 02/08/19 (from the past 48 hour(s))  Culture, blood (Routine X 2) w Reflex to ID Panel     Status: None (Preliminary result)   Collection Time: 02/08/19  8:27 PM   Specimen: Right Antecubital; Blood  Result Value Ref Range   Specimen Description RIGHT ANTECUBITAL    Special Requests      BOTTLES DRAWN AEROBIC AND ANAEROBIC Blood Culture adequate volume Performed at [facility], [address] [place], [place] [address]    Culture PENDING    Report Status PENDING   CBC with Differential/Platelet     Status: Abnormal   Collection Time: 02/08/19  8:53 PM  Result Value Ref Range   WBC 4.5 4.0 - 10.5 K/uL   RBC 4.92 4.22 - 5.81 MIL/uL   Hemoglobin 14.0 13.0 - 17.0 g/dL   HCT 43.4 39.0 - 52.0 %   MCV 88.2 80.0 - 100.0 fL   MCH 28.5 26.0 - 34.0 pg   MCHC 32.3 30.0 - 36.0 g/dL   RDW 13.6 11.5 - 15.5 %   Platelets 145 (L) 150 - 400 K/uL   nRBC 0.0 0.0 - 0.2 %   Neutrophils Relative % 71 %   Neutro Abs 3.2 1.7 - 7.7 K/uL   Lymphocytes Relative 17 %   Lymphs Abs 0.8 0.7 - 4.0 K/uL   Monocytes Relative 12 %   Monocytes Absolute 0.6 0.1 - 1.0 K/uL   Eosinophils Relative 0 %   Eosinophils Absolute 0.0 0.0 - 0.5 K/uL   Basophils Relative 0 %   Basophils Absolute 0.0 0.0 - 0.1 K/uL   Immature Granulocytes 0 %   Abs Immature Granulocytes 0.01 0.00 - 0.07 K/uL    Comment: Performed at [facility], [address] [place], [place] [address]  Comprehensive metabolic panel     Status: Abnormal   Collection Time: 02/08/19  8:53 PM  Result Value Ref Range   Sodium  133 (L) 135 - 145 mmol/L   Potassium 3.9 3.5 - 5.1 mmol/L   Chloride 99 98 - 111 mmol/L   CO2 22 22 - 32 mmol/L   Glucose, Bld 139 (H) 70 - 99 mg/dL   BUN 12 8 - 23 mg/dL   Creatinine, Ser 1.07 0.61 - 1.24 mg/dL   Calcium 8.4 (L) 8.9 - 10.3 mg/dL   Total Protein 7.5 6.5 - 8.1 g/dL   Albumin 4.0 3.5 - 5.0 g/dL   AST 36 15 - 41 U/L   ALT 25 0 - 44 U/L   Alkaline Phosphatase 60 38 - 126 U/L   Total Bilirubin 0.8 0.3 - 1.2 mg/dL   GFR calc non Af Amer >60 >60 mL/min   GFR calc Af Amer >60 >60 mL/min   Anion gap 12 5 - 15    Comment: Performed at [facility], [address], [place], [place] [address]  Troponin I (High Sensitivity)     Status: Abnormal   Collection Time: 02/08/19  8:53 PM  Result Value Ref Range   Troponin I (High Sensitivity) 19 (H) <18 ng/L    Comment: (NOTE) Elevated high sensitivity troponin I (hsTnI) values and significant  changes across serial measurements may suggest ACS but many other  chronic and acute conditions are known to elevate hsTnI results.  Refer to the "Links" section for chest pain  algorithms and additional  guidance. Performed at [facility], [address] [place], [place] [address]   D-dimer, quantitative (not at [facility])     Status: Abnormal   Collection Time: 02/08/19  8:53 PM  Result Value Ref Range   D-Dimer, Quant 0.63 (H) 0.00 - 0.50 ug/mL-FEU    Comment: (NOTE) At the manufacturer cut-off of 0.50 ug/mL FEU, this assay has been documented to exclude PE with a sensitivity and negative predictive value of 97 to 99%.  At this time, this assay has not been approved by the FDA to exclude DVT/VTE. Results should be correlated with clinical presentation. Performed at [facility], [address] [place], [place] [address]   Lactate dehydrogenase     Status: Abnormal   Collection Time: 02/08/19  8:53 PM  Result Value Ref Range   LDH 232 (H) 98 - 192 U/L    Comment: Performed at [facility], [address] [place], [place] [address]   Procalcitonin     Status: None   Collection Time: 02/08/19  8:53 PM  Result Value Ref Range   Procalcitonin <0.10 ng/mL    Comment:        Interpretation: PCT (Procalcitonin) <= 0.5 ng/mL: Systemic infection (sepsis) is not likely. Local bacterial infection is possible. (NOTE)       Sepsis PCT Algorithm           Lower Respiratory Tract                                      Infection PCT Algorithm    ----------------------------     ----------------------------         PCT < 0.25 ng/mL                PCT < 0.10 ng/mL         Strongly encourage             Strongly discourage   discontinuation of antibiotics    initiation of antibiotics    ----------------------------     -----------------------------       PCT 0.25 - 0.50 ng/mL            PCT 0.10 - 0.25 ng/mL               OR       >80% decrease in PCT            Discourage initiation of                                            antibiotics      Encourage discontinuation           of antibiotics    ----------------------------     -----------------------------         PCT >= 0.50 ng/mL              PCT 0.26 - 0.50 ng/mL               AND        <80% decrease in PCT             Encourage initiation of  antibiotics       Encourage continuation           of antibiotics    ----------------------------     -----------------------------        PCT >= 0.50 ng/mL                  PCT > 0.50 ng/mL               AND         increase in PCT                  Strongly encourage                                      initiation of antibiotics    Strongly encourage escalation           of antibiotics                                     -----------------------------                                           PCT <= 0.25 ng/mL                                                 OR                                        > 80% decrease in PCT                                     Discontinue / Do not  initiate                                             antibiotics Performed at [facility], [address] [place], [place] [address]   Brain natriuretic peptide     Status: None   Collection Time: 02/08/19  8:59 PM  Result Value Ref Range   B Natriuretic Peptide 87.0 0.0 - 100.0 pg/mL    Comment: Performed at [facility], [address], [place], [place] [address]  C-reactive protein     Status: Abnormal   Collection Time: 02/08/19  8:59 PM  Result Value Ref Range   CRP 6.9 (H) <1.0 mg/dL    Comment: Performed at [facility], [address], [place], [place] [address]  Ferritin     Status: None   Collection Time: 02/08/19  8:59 PM  Result Value Ref Range   Ferritin 283 24 - 336 ng/mL    Comment: Performed at [facility], [address], [place], [place] [address]  Troponin I (High Sensitivity)     Status: None   Collection Time: 02/08/19 10:51 PM  Result Value Ref Range  Troponin I (High Sensitivity) 17 <18 ng/L    Comment: (NOTE) Elevated high sensitivity troponin I (hsTnI) values and significant  changes across serial measurements may suggest ACS but many other  chronic and acute conditions are known to elevate hsTnI results.  Refer to the "Links" section for chest pain algorithms and additional  guidance. Performed at [facility], [address], [place], [place] [address]   CBG monitoring, ED     Status: Abnormal   Collection Time: 02/08/19 11:36 PM  Result Value Ref Range   Glucose-Capillary 175 (H) 70 - 99 mg/dL    Chemistries  Recent Labs  Lab 02/08/19 2053  NA 133*  K 3.9  CL 99  CO2 22  GLUCOSE 139*  BUN 12  CREATININE 1.07  CALCIUM 8.4*  AST 36  ALT 25  ALKPHOS 60  BILITOT 0.8   ------------------------------------------------------------------------------------------------------------------  ------------------------------------------------------------------------------------------------------------------ GFR: Estimated Creatinine Clearance: 67.4  mL/min (by C-G formula based on SCr of 1.07 mg/dL). Liver Function Tests: Recent Labs  Lab 02/08/19 2053  AST 36  ALT 25  ALKPHOS 60  BILITOT 0.8  PROT 7.5  ALBUMIN 4.0   No results for input(s): LIPASE, AMYLASE in the last 168 hours. No results for input(s): AMMONIA in the last 168 hours. Coagulation Profile: No results for input(s): INR, PROTIME in the last 168 hours. Cardiac Enzymes: No results for input(s): CKTOTAL, CKMB, CKMBINDEX, TROPONINI in the last 168 hours. BNP (last 3 results) No results for input(s): PROBNP in the last 8760 hours. HbA1C: No results for input(s): HGBA1C in the last 72 hours. CBG: Recent Labs  Lab 02/08/19 2336  GLUCAP 175*   Lipid Profile: No results for input(s): CHOL, HDL, LDLCALC, TRIG, CHOLHDL, LDLDIRECT in the last 72 hours. Thyroid Function Tests: No results for input(s): TSH, T4TOTAL, FREET4, T3FREE, THYROIDAB in the last 72 hours. Anemia Panel: Recent Labs    02/08/19 2059  FERRITIN 283    --------------------------------------------------------------------------------------------------------------- Urine analysis: No results found for: COLORURINE, APPEARANCEUR, LABSPEC, PHURINE, GLUCOSEU, HGBUR, BILIRUBINUR, KETONESUR, PROTEINUR, UROBILINOGEN, NITRITE, LEUKOCYTESUR    Imaging Results:    Ct Angio Chest Pe W Or Wo Contrast  Result Date: 02/08/2019 CLINICAL DATA:  Shortness of breath. Atrial fibrillation with RVR. COVID-19 positive. EXAM: CT ANGIOGRAPHY CHEST WITH CONTRAST TECHNIQUE: Multidetector CT imaging of the chest was performed using the standard protocol during bolus administration of intravenous contrast. Multiplanar CT image reconstructions and MIPs were obtained to evaluate the vascular anatomy. CONTRAST:  75m OMNIPAQUE IOHEXOL 350 MG/ML SOLN COMPARISON:  Radiograph earlier this day. FINDINGS: Cardiovascular: There are no filling defects within the pulmonary arteries to suggest pulmonary embolus. The thoracic aorta is  normal in caliber. No aortic dissection. Conventional branching pattern from the aortic arch. The heart is normal in size. No pericardial effusion. Few coronary artery calcifications. Mediastinum/Nodes: Increased number of mildly enlarged bilateral hilar lymph nodes, 11 mm greatest dimension. No mediastinal adenopathy. No visualized thyroid nodule. Esophagus slightly patulous with small hiatal hernia. Lungs/Pleura: Multifocal bilateral ground-glass opacities in the peripheral and basilar predominant distribution. Mild biapical pleuroparenchymal scarring. There is retained mucus in the dependent trachea and mainstem bronchi. Mild central bronchial thickening. Questionable mild pulmonary edema septal thickening at the bases. No confluent airspace disease. No pleural fluid. Upper Abdomen: No acute finding. Musculoskeletal: There are no acute or suspicious osseous abnormalities. Review of the MIP images confirms the above findings. IMPRESSION: 1. No pulmonary embolus. 2. Multifocal peripheral and basilar predominant ground-glass opacities in both lungs, pattern consistent with COVID-19 pneumonia.  3. Mild central bronchial thickening with retained mucus in the trachea and mainstem bronchi. 4. Questionable mild pulmonary edema at the bases septal thickening. 5. Mildly enlarged bilateral hilar lymph nodes are likely reactive, but nonspecific. Adenopathy in the setting of COVID-19 is unusual, and may suggest superimposed bacterial infection. Aortic Atherosclerosis (ICD10-I70.0). Electronically Signed   By: Keith Rake M.D.   On: 02/08/2019 23:32   Dg Chest Portable 1 View  Result Date: 02/08/2019 CLINICAL DATA:  Shortness of breath EXAM: PORTABLE CHEST 1 VIEW COMPARISON:  None. FINDINGS: No focal opacity or pleural effusion. Borderline cardiomegaly with central vascular congestion. No pneumothorax. IMPRESSION: Borderline cardiomegaly with central vascular congestion Electronically Signed   By: Donavan Foil M.D.    On: 02/08/2019 21:14   Afib at 110,     Assessment & Plan:    Principal Problem:   Atrial fibrillation with RVR ([place]) Active Problems:   COVID-19 virus infection   Essential hypertension   Hyperlipidemia   Diabetes (HCC)  Afib with RVR Tele Trop I Check tsh Check cardiac echo Cardizem gtt Heparin gtt  Covid-19 infection,  ? Infiltrate Check crp, esr, cpk, ferritin, d dimer, procalcitonin, IL-6 Blood culture x2 Urine strep antigen Urine legionella antigen Dexamethasone 68m iv qday  Elevated tropoinin Cycle cardiac marker Check cardiac echo as above  Hypertension Cont Losartan 1051mpo qday Cont Hydrochlorothiazide 2579mo qday Cont Clonidine 0.1mg27m qday  Hyperlipidemia,  Cont Simvastatin 40mg64mqhs  Dm2 Hold Metformin fsbs ac and qhs, ISS  Gerd Cont PPI  Anxiety/ Depression Cont Celexa 20mg 44mday Cotn Clonazepam 0.5mg po48md prn  Cont Seroquel 100mg po9m Cont Risperdal 1mg po q107m    DVT Prophylaxis-   Heparin gtt   AM Labs Ordered, also please review Full Orders  Family Communication: Admission, patients condition and plan of care including tests being ordered have been discussed with the patient  who indicate understanding and agree with the plan and Code Status.  Code Status:  FULL CODE per patient, wife is present in ED  Admission status: Inpatient: Based on patients clinical presentation and evaluation of above clinical data, I have made determination that patient meets Inpatient criteria at this time. Pt has Afib with RVR, requiring cardizem gtt, and also has covid-19 infection, and elevated troponin, pt has high risk of clincal deterioration. Pt will require > 2nites stay.   Time spent in minutes : 60 minutes critical care   Casper Pagliuca KimJani Gravel/27/2020 at 1:13 AM

## 2019-02-08 NOTE — ED Provider Notes (Signed)
[facility] EMERGENCY DEPARTMENT Provider Note   CSN: [id number] Arrival date & time: 02/08/19  1949     History   Chief Complaint Chief Complaint  Patient presents with  . Shortness of Breath    COVID positive    HPI Darrell Lang is a 73 y.o. male.     Patient complains of shortness of breath and fever and aches.  He was positive for COVID 2 days ago  The history is provided by the patient. No language interpreter was used.  Shortness of Breath Severity:  Moderate Onset quality:  Sudden Timing:  Constant Progression:  Worsening Chronicity:  New Context: activity   Relieved by:  Nothing Worsened by:  Nothing Ineffective treatments:  None tried Associated symptoms: no abdominal pain, no chest pain, no cough, no headaches and no rash     Past Medical History:  Diagnosis Date  . Anxiety and depression   . Diabetes ([place])   . Hypertension   . Hypertriglyceridemia     Patient Active Problem List   Diagnosis Date Noted  . Atrial fibrillation with RVR ([place]) 02/08/2019  . Encounter for screening colonoscopy 07/28/2013    Past Surgical History:  Procedure Laterality Date  . APPENDECTOMY    . COLONOSCOPY  Sept 2005   Dr. Gala Romney: internal hemorrhoids, otherwise normal rectum. Normal colon and normal TI  . COLONOSCOPY N/A 08/06/2013   Procedure: COLONOSCOPY;  Surgeon: Daneil Dolin, MD;  Location: [facility];  Service: Endoscopy;  Laterality: N/A;  10:15        Home Medications    Prior to Admission medications   Medication Sig Start Date End Date Taking? Authorizing Provider  aspirin 81 MG tablet Take 81 mg by mouth daily.   Yes [provider]  benztropine (COGENTIN) 1 MG tablet Take 1 mg by mouth at bedtime.   Yes [provider]  citalopram (CELEXA) 20 MG tablet Take 20 mg by mouth daily.   Yes [provider]  clonazePAM (KLONOPIN) 0.5 MG tablet Take 0.5 mg by mouth 2 (two) times daily.    Yes [provider]   cloNIDine (CATAPRES) 0.1 MG tablet Take 0.1 mg by mouth daily.   Yes [provider]  Cyanocobalamin (B-12 PO) Take 1 tablet by mouth daily.   Yes [provider]  hydrochlorothiazide (HYDRODIURIL) 25 MG tablet Take 25 mg by mouth daily.   Yes [provider]  losartan (COZAAR) 100 MG tablet Take 100 mg by mouth daily.   Yes [provider]  metFORMIN (GLUCOPHAGE) 1000 MG tablet Take 1,000 mg by mouth 2 (two) times daily with a meal.   Yes [provider]  Multiple Vitamin (MULTIVITAMIN) tablet Take 1 tablet by mouth daily.   Yes [provider]  mupirocin ointment (BACTROBAN) 2 % Apply 1 application topically every evening. Applied to affected toe 01/30/19  Yes [provider]  omeprazole (PRILOSEC) 20 MG capsule Take 20 mg by mouth daily.   Yes [provider]  QUEtiapine (SEROQUEL) 200 MG tablet Take 100 mg by mouth at bedtime.    Yes [provider]  risperiDONE (RISPERDAL) 1 MG tablet Take 1 mg by mouth at bedtime.   Yes [provider]  simvastatin (ZOCOR) 80 MG tablet Take 40 mg by mouth at bedtime.    Yes [provider]  doxycycline (VIBRAMYCIN) 100 MG capsule Take 1 capsule (100 mg total) by mouth 2 (two) times daily. Patient not taking: Reported on 02/08/2019 01/13/19  [place], [place], PA-C  haloperidol (HALDOL) 2 MG tablet Take 2 mg by mouth every 8 (eight) hours as needed for agitation. Just takes one tablet at bedtime  01/13/19  [provider]    Family History Family History  Problem Relation Age of Onset  . Colon cancer Neg Hx     Social History Social History   Tobacco Use  . Smoking status: Former Smoker    Packs/day: 0.00    Years: 26.00    Pack years: 0.00    Types: Cigarettes  . Smokeless tobacco: Never Used  . Tobacco comment: Quit smoking in 1986  Substance Use Topics  . Alcohol use: No    Comment: history of ETOH abuse in remote past, none currently.    . Drug use: No     Allergies   Patient has no known allergies.   Review of Systems Review of Systems  Constitutional: Negative for appetite change and fatigue.  HENT: Negative for congestion, ear discharge and sinus pressure.   Eyes: Negative for discharge.  Respiratory: Positive for shortness of breath. Negative for cough.   Cardiovascular: Negative for chest pain.  Gastrointestinal: Negative for abdominal pain and diarrhea.  Genitourinary: Negative for frequency and hematuria.  Musculoskeletal: Negative for back pain.  Skin: Negative for rash.  Neurological: Negative for seizures and headaches.  Psychiatric/Behavioral: Negative for hallucinations.     Physical Exam Updated Vital Signs BP (!) 157/75   Pulse 91   Temp 98.9 F (37.2 C) (Oral)   Resp 12   Ht 5\' 11"  (1.803 m)   Wt 85.3 kg   SpO2 96%   BMI 26.23 kg/m   Physical Exam Vitals signs and nursing note reviewed.  Constitutional:      Appearance: He is well-developed.  HENT:     Head: Normocephalic.     Nose: Nose normal.  Eyes:     General: No scleral icterus.    Conjunctiva/sclera: Conjunctivae normal.  Neck:     Musculoskeletal: Neck supple.     Thyroid: No thyromegaly.  Cardiovascular:     Heart sounds: No murmur. No friction rub. No gallop.      Comments: Rapid irregular rate Pulmonary:     Breath sounds: No stridor. No wheezing or rales.  Chest:     Chest wall: No tenderness.  Abdominal:     General: There is no distension.     Tenderness: There is no abdominal tenderness. There is no rebound.  Musculoskeletal: Normal range of motion.  Lymphadenopathy:     Cervical: No cervical adenopathy.  Skin:    Findings: No erythema or rash.  Neurological:     Mental Status: He is oriented to person, place, and time.     Motor: No abnormal muscle tone.     Coordination: Coordination normal.  Psychiatric:        Behavior: Behavior normal.      ED Treatments / Results  Labs (all labs ordered  are listed, but only abnormal results are displayed) Labs Reviewed  CBC WITH DIFFERENTIAL/PLATELET - Abnormal; Notable for the following components:      Result Value   Platelets 145 (*)    All other components within normal limits  COMPREHENSIVE METABOLIC PANEL - Abnormal; Notable for the following components:   Sodium 133 (*)    Glucose, Bld 139 (*)    Calcium 8.4 (*)    All other components within normal limits  TROPONIN I (HIGH SENSITIVITY) - Abnormal; Notable for the following components:  Troponin I (High Sensitivity) 19 (*)    All other components within normal limits  BRAIN NATRIURETIC PEPTIDE    EKG EKG Interpretation  Date/Time:  Saturday February 08 2019 20:18:06 EDT Ventricular Rate:  111 PR Interval:    QRS Duration: 85 QT Interval:  334 QTC Calculation: 442 R Axis:   68 Text Interpretation:  Atrial fibrillation Probable anteroseptal infarct, old Confirmed by Milton Ferguson [phone]) on 02/08/2019 8:52:04 PM   Radiology Dg Chest Portable 1 View  Result Date: 02/08/2019 CLINICAL DATA:  Shortness of breath EXAM: PORTABLE CHEST 1 VIEW COMPARISON:  None. FINDINGS: No focal opacity or pleural effusion. Borderline cardiomegaly with central vascular congestion. No pneumothorax. IMPRESSION: Borderline cardiomegaly with central vascular congestion Electronically Signed   By: Donavan Foil M.D.   On: 02/08/2019 21:14    Procedures Procedures (including critical care time)  Medications Ordered in ED Medications  diltiazem (CARDIZEM) 100 mg in dextrose 5 % 100 mL (1 mg/mL) infusion (5 mg/hr Intravenous New Bag/Given 02/08/19 2108)  insulin aspart (novoLOG) injection 0-9 Units (has no administration in time range)  methylPREDNISolone sodium succinate (SOLU-MEDROL) 125 mg/2 mL injection 125 mg (125 mg Intravenous Given 02/08/19 2108)     Initial Impression / Assessment and Plan / ED Course  I have reviewed the triage vital signs and the nursing notes.  Pertinent labs &  imaging results that were available during my care of the patient were reviewed by me and considered in my medical decision making (see chart for details).        CRITICAL CARE Performed by: Milton Ferguson Total critical care time: 40 minutes Critical care time was exclusive of separately billable procedures and treating other patients. Critical care was necessary to treat or prevent imminent or life-threatening deterioration. Critical care was time spent personally by me on the following activities: development of treatment plan with patient and/or surrogate as well as nursing, discussions with consultants, evaluation of patient's response to treatment, examination of patient, obtaining history from patient or surrogate, ordering and performing treatments and interventions, ordering and review of laboratory studies, ordering and review of radiographic studies, pulse oximetry and re-evaluation of patient's condition. Patient with coinfection and also rapid atrial fib new onset.  Patient placed on Cardizem drip.  He will be admitted to medicine Final Clinical Impressions(s) / ED Diagnoses   Final diagnoses:  SOB (shortness of breath)    ED Discharge Orders    None       Milton Ferguson, MD 02/08/19 2215

## 2019-02-08 NOTE — Progress Notes (Signed)
ANTICOAGULATION CONSULT NOTE - Preliminary  Pharmacy Consult for heparin Indication: atrial fibrillation  No Known Allergies  Patient Measurements: Height: 5\' 11"  (180.3 cm) Weight: 188 lb 0.8 oz (85.3 kg) IBW/kg (Calculated) : 75.3 HEPARIN DW (KG): 85.3   Vital Signs: Temp: 98.9 F (37.2 C) (09/26 2039) Temp Source: Oral (09/26 2039) BP: 130/63 (09/26 2330) Pulse Rate: 116 (09/26 2330)  Labs: Recent Labs    02/08/19 2053  HGB 14.0  HCT 43.4  PLT 145*  CREATININE 1.07   Estimated Creatinine Clearance: 67.4 mL/min (by C-G formula based on SCr of 1.07 mg/dL).  Medical History: Past Medical History:  Diagnosis Date  . Anxiety and depression   . Diabetes ([place])   . Hypertension   . Hypertriglyceridemia     Medications:  Anti-infectives (From admission, onward)   None      Assessment: 73 yo male with SOB, fever and aches.  Covid + 2 days ago.  Patient found to have rapid atrial fibrillation, for which pharmacy has been asked to dose heparin.   Goal of Therapy:  Heparin level 0.3-0.7 units/ml   Plan:  Give 4000 units bolus x 1 Start heparin infusion at 1300 units/hr Check anti-Xa level in 6 hours and daily while on heparin Continue to monitor H&H and platelets Preliminary review of pertinent patient information completed.  Forestine Na clinical pharmacist will complete review during morning rounds to assess the patient and finalize treatment regimen.  Nyra Capes, RPH 02/08/2019,11:53 PM

## 2019-02-09 ENCOUNTER — Inpatient Hospital Stay (HOSPITAL_COMMUNITY): Payer: No Typology Code available for payment source

## 2019-02-09 ENCOUNTER — Encounter (HOSPITAL_COMMUNITY): Payer: Self-pay | Admitting: Internal Medicine

## 2019-02-09 DIAGNOSIS — E119 Type 2 diabetes mellitus without complications: Secondary | ICD-10-CM

## 2019-02-09 DIAGNOSIS — U071 COVID-19: Secondary | ICD-10-CM | POA: Diagnosis present

## 2019-02-09 DIAGNOSIS — I10 Essential (primary) hypertension: Secondary | ICD-10-CM | POA: Diagnosis present

## 2019-02-09 DIAGNOSIS — J1289 Other viral pneumonia: Secondary | ICD-10-CM

## 2019-02-09 DIAGNOSIS — E785 Hyperlipidemia, unspecified: Secondary | ICD-10-CM

## 2019-02-09 DIAGNOSIS — I4891 Unspecified atrial fibrillation: Secondary | ICD-10-CM

## 2019-02-09 DIAGNOSIS — J9601 Acute respiratory failure with hypoxia: Secondary | ICD-10-CM

## 2019-02-09 DIAGNOSIS — E1165 Type 2 diabetes mellitus with hyperglycemia: Secondary | ICD-10-CM

## 2019-02-09 HISTORY — DX: COVID-19: U07.1

## 2019-02-09 HISTORY — DX: Hyperlipidemia, unspecified: E78.5

## 2019-02-09 HISTORY — DX: Essential (primary) hypertension: I10

## 2019-02-09 LAB — COMPREHENSIVE METABOLIC PANEL
ALT: 25 U/L (ref 0–44)
AST: 34 U/L (ref 15–41)
Albumin: 4 g/dL (ref 3.5–5.0)
Alkaline Phosphatase: 59 U/L (ref 38–126)
Anion gap: 12 (ref 5–15)
BUN: 12 mg/dL (ref 8–23)
CO2: 19 mmol/L — ABNORMAL LOW (ref 22–32)
Calcium: 8.2 mg/dL — ABNORMAL LOW (ref 8.9–10.3)
Chloride: 99 mmol/L (ref 98–111)
Creatinine, Ser: 0.96 mg/dL (ref 0.61–1.24)
GFR calc Af Amer: >60 mL/min (ref >60)
GFR calc non Af Amer: >60 mL/min (ref >60)
Glucose, Bld: 191 mg/dL — ABNORMAL HIGH (ref 70–99)
Potassium: 4.4 mmol/L (ref 3.5–5.1)
Sodium: 130 mmol/L — ABNORMAL LOW (ref 135–145)
Total Bilirubin: 1.3 mg/dL — ABNORMAL HIGH (ref 0.3–1.2)
Total Protein: 7.5 g/dL (ref 6.5–8.1)

## 2019-02-09 LAB — CBC WITH DIFFERENTIAL/PLATELET
Abs Immature Granulocytes: 0.02 10*3/uL (ref 0.00–0.07)
Basophils Absolute: 0 10*3/uL (ref 0.0–0.1)
Basophils Relative: 0 %
Eosinophils Absolute: 0 10*3/uL (ref 0.0–0.5)
Eosinophils Relative: 0 %
HCT: 42.8 % (ref 39.0–52.0)
Hemoglobin: 13.9 g/dL (ref 13.0–17.0)
Immature Granulocytes: 1 %
Lymphocytes Relative: 9 %
Lymphs Abs: 0.4 10*3/uL — ABNORMAL LOW (ref 0.7–4.0)
MCH: 28.8 pg (ref 26.0–34.0)
MCHC: 32.5 g/dL (ref 30.0–36.0)
MCV: 88.6 fL (ref 80.0–100.0)
Monocytes Absolute: 0.1 10*3/uL (ref 0.1–1.0)
Monocytes Relative: 3 %
Neutro Abs: 3.6 10*3/uL (ref 1.7–7.7)
Neutrophils Relative %: 87 %
Platelets: 131 10*3/uL — ABNORMAL LOW (ref 150–400)
RBC: 4.83 MIL/uL (ref 4.22–5.81)
RDW: 13.5 % (ref 11.5–15.5)
WBC: 4.1 10*3/uL (ref 4.0–10.5)
nRBC: 0 % (ref 0.0–0.2)

## 2019-02-09 LAB — ABO/RH: ABO/RH(D): O POS

## 2019-02-09 LAB — APTT: aPTT: 35 seconds (ref 24–36)

## 2019-02-09 LAB — PROTIME-INR
INR: 1 (ref 0.8–1.2)
Prothrombin Time: 13.2 seconds (ref 11.4–15.2)

## 2019-02-09 LAB — ECHOCARDIOGRAM COMPLETE
Height: 71 in
Weight: 3058.22 oz

## 2019-02-09 LAB — HEMOGLOBIN A1C
Hgb A1c MFr Bld: 8.6 % — ABNORMAL HIGH (ref 4.8–5.6)
Mean Plasma Glucose: 200.12 mg/dL

## 2019-02-09 LAB — HEPARIN LEVEL (UNFRACTIONATED)
Heparin Unfractionated: 1.07 IU/mL — ABNORMAL HIGH (ref 0.30–0.70)
Heparin Unfractionated: 0.65 IU/mL (ref 0.30–0.70)

## 2019-02-09 LAB — GLUCOSE, CAPILLARY
Glucose-Capillary: 199 mg/dL — ABNORMAL HIGH (ref 70–99)
Glucose-Capillary: 164 mg/dL — ABNORMAL HIGH (ref 70–99)
Glucose-Capillary: 290 mg/dL — ABNORMAL HIGH (ref 70–99)
Glucose-Capillary: 206 mg/dL — ABNORMAL HIGH (ref 70–99)
Glucose-Capillary: 264 mg/dL — ABNORMAL HIGH (ref 70–99)

## 2019-02-09 LAB — LACTATE DEHYDROGENASE: LDH: 232 U/L — ABNORMAL HIGH (ref 98–192)

## 2019-02-09 LAB — TSH: TSH: 0.299 u[IU]/mL — ABNORMAL LOW (ref 0.350–4.500)

## 2019-02-09 LAB — C-REACTIVE PROTEIN: CRP: 6.9 mg/dL — ABNORMAL HIGH (ref <1.0)

## 2019-02-09 LAB — PROCALCITONIN: Procalcitonin: <0.1 ng/mL

## 2019-02-09 LAB — MRSA PCR SCREENING: MRSA by PCR: NEGATIVE

## 2019-02-09 LAB — FERRITIN: Ferritin: 283 ng/mL (ref 24–336)

## 2019-02-09 MED ORDER — PANTOPRAZOLE SODIUM 40 MG PO TBEC
40.0000 mg | DELAYED_RELEASE_TABLET | Freq: Every day | ORAL | Status: DC
  Administered 2019-02-09 – 2019-02-13 (×5): 40 mg via ORAL
  Filled 2019-02-09 (×5): qty 1

## 2019-02-09 MED ORDER — ATORVASTATIN CALCIUM 40 MG PO TABS
40.0000 mg | ORAL_TABLET | Freq: Every day | ORAL | Status: DC
  Administered 2019-02-09 – 2019-02-12 (×4): 40 mg via ORAL
  Filled 2019-02-09 (×4): qty 1

## 2019-02-09 MED ORDER — HEPARIN (PORCINE) 25000 UT/250ML-% IV SOLN
1100.0000 [IU]/h | INTRAVENOUS | Status: DC
  Administered 2019-02-09 (×2): 1100 [IU]/h via INTRAVENOUS
  Filled 2019-02-09: qty 250

## 2019-02-09 MED ORDER — LOSARTAN POTASSIUM 25 MG PO TABS
100.0000 mg | ORAL_TABLET | Freq: Every day | ORAL | Status: DC
  Administered 2019-02-10 – 2019-02-13 (×4): 100 mg via ORAL
  Filled 2019-02-09 (×3): qty 4
  Filled 2019-02-09 (×2): qty 2

## 2019-02-09 MED ORDER — DEXAMETHASONE SODIUM PHOSPHATE 10 MG/ML IJ SOLN
6.0000 mg | INTRAMUSCULAR | Status: DC
  Administered 2019-02-09 – 2019-02-12 (×4): 6 mg via INTRAVENOUS
  Filled 2019-02-09: qty 0.6
  Filled 2019-02-09 (×3): qty 1
  Filled 2019-02-09: qty 0.6

## 2019-02-09 MED ORDER — SODIUM CHLORIDE 0.9 % IV SOLN
500.0000 mg | INTRAVENOUS | Status: DC
  Administered 2019-02-09: 500 mg via INTRAVENOUS
  Filled 2019-02-09 (×2): qty 500

## 2019-02-09 MED ORDER — ADULT MULTIVITAMIN W/MINERALS CH
1.0000 | ORAL_TABLET | Freq: Every day | ORAL | Status: DC
  Administered 2019-02-09 – 2019-02-13 (×5): 1 via ORAL
  Filled 2019-02-09 (×5): qty 1

## 2019-02-09 MED ORDER — CLONIDINE HCL 0.2 MG PO TABS
0.1000 mg | ORAL_TABLET | Freq: Every day | ORAL | Status: DC
  Administered 2019-02-09: 09:00:00 0.1 mg via ORAL
  Filled 2019-02-09: qty 1

## 2019-02-09 MED ORDER — SODIUM CHLORIDE 0.9 % IV SOLN
100.0000 mg | INTRAVENOUS | Status: AC
  Administered 2019-02-10 – 2019-02-13 (×4): 100 mg via INTRAVENOUS
  Filled 2019-02-09 (×4): qty 20

## 2019-02-09 MED ORDER — MUPIROCIN 2 % EX OINT
1.0000 "application " | TOPICAL_OINTMENT | Freq: Every evening | CUTANEOUS | Status: DC
  Administered 2019-02-10 – 2019-02-12 (×3): 1 via TOPICAL
  Filled 2019-02-09: qty 22

## 2019-02-09 MED ORDER — DILTIAZEM HCL-DEXTROSE 100-5 MG/100ML-% IV SOLN (PREMIX)
5.0000 mg/h | INTRAVENOUS | Status: DC
  Administered 2019-02-09: 5 mg/h via INTRAVENOUS
  Filled 2019-02-09 (×2): qty 100

## 2019-02-09 MED ORDER — CHLORHEXIDINE GLUCONATE 0.12 % MT SOLN
15.0000 mL | Freq: Two times a day (BID) | OROMUCOSAL | Status: DC
  Administered 2019-02-09 – 2019-02-13 (×8): 15 mL via OROMUCOSAL
  Filled 2019-02-09 (×8): qty 15

## 2019-02-09 MED ORDER — HYDROCHLOROTHIAZIDE 25 MG PO TABS
25.0000 mg | ORAL_TABLET | Freq: Every day | ORAL | Status: DC
  Filled 2019-02-09: qty 1

## 2019-02-09 MED ORDER — BENZTROPINE MESYLATE 0.5 MG PO TABS
1.0000 mg | ORAL_TABLET | Freq: Every day | ORAL | Status: DC
  Administered 2019-02-09 – 2019-02-12 (×4): 1 mg via ORAL
  Filled 2019-02-09 (×3): qty 1
  Filled 2019-02-09 (×3): qty 2

## 2019-02-09 MED ORDER — QUETIAPINE FUMARATE 25 MG PO TABS
100.0000 mg | ORAL_TABLET | Freq: Every day | ORAL | Status: DC
  Administered 2019-02-09 – 2019-02-10 (×2): 100 mg via ORAL
  Filled 2019-02-09: qty 4
  Filled 2019-02-09: qty 2

## 2019-02-09 MED ORDER — VITAMIN B-12 1000 MCG PO TABS
2000.0000 ug | ORAL_TABLET | Freq: Every day | ORAL | Status: DC
  Administered 2019-02-09 – 2019-02-13 (×5): 2000 ug via ORAL
  Filled 2019-02-09 (×5): qty 2

## 2019-02-09 MED ORDER — INSULIN ASPART 100 UNIT/ML ~~LOC~~ SOLN
0.0000 [IU] | Freq: Every day | SUBCUTANEOUS | Status: DC
  Administered 2019-02-09: 3 [IU] via SUBCUTANEOUS

## 2019-02-09 MED ORDER — CHLORHEXIDINE GLUCONATE CLOTH 2 % EX PADS
6.0000 | MEDICATED_PAD | Freq: Every day | CUTANEOUS | Status: DC
  Administered 2019-02-09 – 2019-02-12 (×3): 6 via TOPICAL

## 2019-02-09 MED ORDER — ORAL CARE MOUTH RINSE
15.0000 mL | Freq: Two times a day (BID) | OROMUCOSAL | Status: DC
  Administered 2019-02-10 – 2019-02-13 (×4): 15 mL via OROMUCOSAL

## 2019-02-09 MED ORDER — SODIUM CHLORIDE 0.9 % IV SOLN
200.0000 mg | Freq: Once | INTRAVENOUS | Status: AC
  Administered 2019-02-09: 200 mg via INTRAVENOUS
  Filled 2019-02-09: qty 40

## 2019-02-09 MED ORDER — CLONAZEPAM 0.5 MG PO TABS
0.5000 mg | ORAL_TABLET | Freq: Two times a day (BID) | ORAL | Status: DC
  Administered 2019-02-09 – 2019-02-11 (×5): 0.5 mg via ORAL
  Filled 2019-02-09 (×5): qty 1

## 2019-02-09 MED ORDER — CITALOPRAM HYDROBROMIDE 10 MG PO TABS
20.0000 mg | ORAL_TABLET | Freq: Every day | ORAL | Status: DC
  Administered 2019-02-09 – 2019-02-13 (×5): 20 mg via ORAL
  Filled 2019-02-09 (×2): qty 1
  Filled 2019-02-09 (×3): qty 2

## 2019-02-09 MED ORDER — RISPERIDONE 1 MG PO TABS
1.0000 mg | ORAL_TABLET | Freq: Every day | ORAL | Status: DC
  Administered 2019-02-09 – 2019-02-12 (×4): 1 mg via ORAL
  Filled 2019-02-09 (×6): qty 1

## 2019-02-09 MED ORDER — INSULIN ASPART 100 UNIT/ML ~~LOC~~ SOLN
0.0000 [IU] | Freq: Three times a day (TID) | SUBCUTANEOUS | Status: DC
  Administered 2019-02-09: 5 [IU] via SUBCUTANEOUS
  Administered 2019-02-10: 11 [IU] via SUBCUTANEOUS
  Administered 2019-02-10 (×2): 8 [IU] via SUBCUTANEOUS
  Administered 2019-02-11: 11 [IU] via SUBCUTANEOUS

## 2019-02-09 MED ORDER — DILTIAZEM HCL 30 MG PO TABS
30.0000 mg | ORAL_TABLET | Freq: Four times a day (QID) | ORAL | Status: DC
  Administered 2019-02-09 – 2019-02-10 (×3): 30 mg via ORAL
  Filled 2019-02-09 (×4): qty 1

## 2019-02-09 NOTE — Progress Notes (Signed)
ANTICOAGULATION CONSULT NOTE - Follow-Up  Pharmacy Consult for heparin Indication: atrial fibrillation  No Known Allergies  Patient Measurements: Height: 5\' 11"  (180.3 cm) Weight: 191 lb 2.2 oz (86.7 kg) IBW/kg (Calculated) : 75.3 HEPARIN DW (KG): 85.3   Vital Signs: Temp: 97.8 F (36.6 C) (09/27 1600) Temp Source: Axillary (09/27 1200) BP: 133/72 (09/27 1700) Pulse Rate: 75 (09/27 1700)  Labs: Recent Labs    02/08/19 2053 02/09/19 0059 02/09/19 0633 02/09/19 1710  HGB 14.0 13.9  --   --   HCT 43.4 42.8  --   --   PLT 145* 131*  --   --   APTT  --  35  --   --   LABPROT  --  13.2  --   --   INR  --  1.0  --   --   HEPARINUNFRC  --   --  1.07* 0.65  CREATININE 1.07 0.96  --   --    Estimated Creatinine Clearance: 75.2 mL/min (by C-G formula based on SCr of 0.96 mg/dL).   Medications:  Anti-infectives (From admission, onward)   Start     Dose/Rate Route Frequency Ordered Stop   02/10/19 1200  remdesivir 100 mg in sodium chloride 0.9 % 250 mL IVPB     100 mg 500 mL/hr over 30 Minutes Intravenous Every 24 hours 02/09/19 0820 02/14/19 1159   02/09/19 1200  remdesivir 200 mg in sodium chloride 0.9 % 250 mL IVPB     200 mg 500 mL/hr over 30 Minutes Intravenous Once 02/09/19 0820 02/09/19 1242   02/09/19 0145  azithromycin (ZITHROMAX) 500 mg in sodium chloride 0.9 % 250 mL IVPB  Status:  Discontinued     500 mg 250 mL/hr over 60 Minutes Intravenous Every 24 hours 02/09/19 0133 02/09/19 1628      Assessment: 73 yo male with SOB, fever and aches.  Covid + 2 days ago.  Patient found to have rapid atrial fibrillation, for which pharmacy has been asked to dose heparin.   Heparin level therapeutic on recheck after dose adjustment earlier. Given COVID-19 positive, will defer confirmatory level in 8hr and recheck with daily lab draw.  Goal of Therapy:  Heparin level 0.3-0.7 units/ml   Plan:  -Continue heparin 1100 units/hr -Daily heparin level and CBC   Arrie Senate, PharmD, BCPS Clinical Pharmacist [phone] Please check AMION for all [facility] Pharmacy numbers 02/09/2019

## 2019-02-09 NOTE — Progress Notes (Signed)
  Echocardiogram 2D Echocardiogram has been performed.  Bobbye Charleston 02/09/2019, 2:28 PM

## 2019-02-09 NOTE — Progress Notes (Signed)
Pharmacy: Remdesivir  73 [place] who presented on 9/26 with COVID PNA. CXR showed multifocal PNA, LFTs are wnl, and the patient is now requiring supplemental oxygen. The patient meets qualifications to start Remdesivir.  Plan - Start Remdesivir 200 mg x 1 followed by 100 mg/24h - Will monitor LFTs, clinical improvement, need for transfer to [facility]  Thank you for allowing pharmacy to be a part of this patient's care.  Alycia Rossetti, PharmD, BCPS Clinical Pharmacist Clinical phone for 02/09/2019: [phone] 02/09/2019 8:19 AM   **Pharmacist phone directory can now be found on [email or web address] ([email or web address]).  Listed under [place].

## 2019-02-09 NOTE — Progress Notes (Signed)
ANTICOAGULATION CONSULT NOTE - Follow-Up  Pharmacy Consult for heparin Indication: atrial fibrillation  No Known Allergies  Patient Measurements: Height: 5\' 11"  (180.3 cm) Weight: 191 lb 2.2 oz (86.7 kg) IBW/kg (Calculated) : 75.3 HEPARIN DW (KG): 85.3   Vital Signs: Temp: 97.9 F (36.6 C) (09/27 0737) Temp Source: Oral (09/27 0737) BP: 122/75 (09/27 0737) Pulse Rate: 86 (09/27 0700)  Labs: Recent Labs    02/08/19 2053 02/09/19 0059 02/09/19 0633  HGB 14.0 13.9  --   HCT 43.4 42.8  --   PLT 145* 131*  --   APTT  --  35  --   LABPROT  --  13.2  --   INR  --  1.0  --   HEPARINUNFRC  --   --  1.07*  CREATININE 1.07 0.96  --    Estimated Creatinine Clearance: 75.2 mL/min (by C-G formula based on SCr of 0.96 mg/dL).   Medications:  Anti-infectives (From admission, onward)   Start     Dose/Rate Route Frequency Ordered Stop   02/09/19 0145  azithromycin (ZITHROMAX) 500 mg in sodium chloride 0.9 % 250 mL IVPB     500 mg 250 mL/hr over 60 Minutes Intravenous Every 24 hours 02/09/19 0133        Assessment: 73 yo male with SOB, fever and aches.  Covid + 2 days ago.  Patient found to have rapid atrial fibrillation, for which pharmacy has been asked to dose heparin.   Heparin level this morning is SUPRAtherapeutic (HL 1.07, goal of 0.3-0.7). No bleeding noted. Will hold for 1 hour and resume at a lower rate.   Goal of Therapy:  Heparin level 0.3-0.7 units/ml   Plan:  - Hold Heparin drip for 1 hour (0800 - 0900) - Restart at a lower rate of 1100 units/hr (11 ml/hr) starting at 0900 - Will continue to monitor for any signs/symptoms of bleeding and will follow up with heparin level in 8 hours   Thank you for allowing pharmacy to be a part of this patient's care.  Alycia Rossetti, PharmD, BCPS Clinical Pharmacist Clinical phone for 02/09/2019: [phone] 02/09/2019 7:52 AM   **Pharmacist phone directory can now be found on [email or web address] ([email or web address]).  Listed under [place].

## 2019-02-09 NOTE — ED Notes (Signed)
Pts daughter Colletta [place] updated on plan of care. Daughter verbalizes understanding. Her contact information has been updated in the chart.

## 2019-02-09 NOTE — ED Notes (Signed)
Pt is now experiencing periods of confusion, pt also diaphoretic at this time as well.

## 2019-02-09 NOTE — Progress Notes (Signed)
PROGRESS NOTE   Darrell Lang  [id number]    DOB: 1945/09/18    DOA: 02/08/2019  PCP: System, Pcp Not In   I have briefly reviewed patients previous medical records in [facility].  Chief Complaint  Patient presents with  . Shortness of Breath    COVID positive    Brief Narrative:  73 year old male with PMH of HTN, HLD, DM 2, anxiety and depression presented with complaints of fever, dry cough and dyspnea.  Now diagnosed with COVID-19 pneumonia, hypoxia, new onset A. fib.  Started on IV Cardizem and heparin drip.  Rate controlled.  Transition to oral Cardizem, wean off IV Cardizem.   Assessment & Plan:   Principal Problem:   Atrial fibrillation with RVR ([place]) Active Problems:   COVID-19 virus infection   Essential hypertension   Hyperlipidemia   Diabetes (HCC)   A. fib with RVR  Appears new onset.  TTE 9/27: LVEF 55-60%.  Troponin x2: Unremarkable.  Follow TSH  Placed on IV Cardizem drip with good rate control.  Transition to oral Cardizem 30 mg every 6 hourly and wean off of IV Cardizem.  Continue IV heparin drip for today and transition to possible Eliquis in a.m.  CHA2DS2-VASc score: 3 (age, HTN and DM)  COVID-19 pneumonia  SARS coronavirus 2 testing positive on 9/24.  D-dimer positive.  CTA chest: No pulmonary embolism.  Multifocal peripheral and basilar predominant groundglass opacities in both lungs consistent with COVID-19 pneumonia.  LDH: 232, ferritin 283, CRP 6.9, procalcitonin negative and d-dimer 0.63.  Continue IV Decadron 6 mg every 24 hours.  Added Remdesivir per pharmacy.  Continue airborne and contact precautions.  Acute hypoxic respiratory failure  Had oxygen saturations of 89% on room air and saturating in the low 90s on oxygen 3 L/min.  Secondary to COVID pneumonia.  Titrate oxygen as needed to maintain saturations greater than 92%.  Essential hypertension  Blood pressure soft as noted below.  Has already received  today's dose of losartan, HCTZ and clonidine.  Since starting Cardizem, to avoid hypotension, will discontinue HCTZ and clonidine and continue losartan with close BP monitoring.  Hyperlipidemia  Continue statins.  Type II DM  Hold metformin.  Worsening hyperglycemia due to steroids, change SSI to moderate sensitive with bedtime scale.  May consider adding low-dose Lantus if worsens  GERD  Continue PPI.  Anxiety and depression  Appears stable.  Continue PTA polypharmacy including Celexa 20 mg daily, clonazepam 0.5 mg twice daily as needed, Seroquel 100 mg at bedtime, Risperdal 1 mg at bedtime and benztropine 1 mg at bedtime.  EKG 02/08/2019 with QTC of 442 ms.  Hyponatremia  Likely related to HCTZ, DC and follow.  Thrombocytopenia  Baseline not available.  Could be related to COVID infection.  Follow CBC closely.    DVT prophylaxis: Currently on IV heparin infusion. Code Status: Full Family Communication: None at bedside Disposition: DC home pending clinical improvement   Consultants:  None  Procedures:  EEG 02/09/2019:  IMPRESSIONS    1. Left ventricular ejection fraction, by visual estimation, is 55 to 60%. The left ventricle has normal function. Normal left ventricular size. There is mild left ventricular hypertrophy.  2. Global right ventricle has normal systolic function.The right ventricular size is normal. No increase in right ventricular wall thickness.  3. Left atrial size was normal.  4. Right atrial size was normal.  5. The mitral valve is normal in structure. No evidence of mitral valve regurgitation.  6. The tricuspid  valve is grossly normal. Tricuspid valve regurgitation is trivial.  7. The aortic valve is normal in structure. Aortic valve regurgitation was not visualized by color flow Doppler.  8. The pulmonic valve was not well visualized. Pulmonic valve regurgitation is not visualized by color flow Doppler.  9. TR signal is inadequate for  assessing pulmonary artery systolic pressure. 10. The inferior vena cava is dilated in size with >50% respiratory variability, suggesting right atrial pressure of 8 mmHg.   Antimicrobials:  None   Subjective: Patient reports feeling better.  Denies complaints.  Interviewed with nursing in room.  Denies dyspnea, chest pain, palpitations, dizziness or lightheadedness.  Mild intermittent dry cough.  Objective:  Vitals:   02/09/19 1000 02/09/19 1100 02/09/19 1200 02/09/19 1300  BP: (!) 90/54 94/61 110/67 (!) 110/57  Pulse: 78 74 85 71  Resp: (!) 21 (!) 22 17 20   Temp:   97.6 F (36.4 C)   TempSrc:   Axillary   SpO2: 91% 91% 93% 91%  Weight:      Height:        Examination:  General exam: Pleasant elderly male, moderately built and nourished sitting up comfortably in bed without distress. Respiratory system: Slightly harsh breath sounds in the bases with occasional basal crackles but otherwise clear to auscultation without wheezing or rhonchi.  No increased work of breathing. Cardiovascular system: S1 & S2 heard, irregularly irregular. No JVD, murmurs, rubs, gallops or clicks. No pedal edema.  Telemetry personally reviewed: A. fib with controlled ventricular rate. Gastrointestinal system: Abdomen is nondistended, soft and nontender. No organomegaly or masses felt. Normal bowel sounds heard. Central nervous system: Alert and oriented. No focal neurological deficits. Extremities: Symmetric 5 x 5 power. Skin: No rashes, lesions or ulcers Psychiatry: Judgement and insight appear normal. Mood & affect appropriate.     Data Reviewed: I have personally reviewed following labs and imaging studies  CBC: Recent Labs  Lab 02/08/19 2053 02/09/19 0059  WBC 4.5 4.1  NEUTROABS 3.2 3.6  HGB 14.0 13.9  HCT 43.4 42.8  MCV 88.2 88.6  PLT 145* [id number]*   Basic Metabolic Panel: Recent Labs  Lab 02/08/19 2053 02/09/19 0059  NA 133* 130*  K 3.9 4.4  CL 99 99  CO2 22 19*  GLUCOSE 139*  191*  BUN 12 12  CREATININE 1.07 0.96  CALCIUM 8.4* 8.2*   Liver Function Tests: Recent Labs  Lab 02/08/19 2053 02/09/19 0059  AST 36 34  ALT 25 25  ALKPHOS 60 59  BILITOT 0.8 1.3*  PROT 7.5 7.5  ALBUMIN 4.0 4.0    Cardiac Enzymes: No results for input(s): CKTOTAL, CKMB, CKMBINDEX, TROPONINI in the last 168 hours.  CBG: Recent Labs  Lab 02/08/19 2336 02/09/19 0402 02/09/19 0733 02/09/19 1106  GLUCAP 175* 199* 164* 290*    Recent Results (from the past 240 hour(s))  Novel Coronavirus, NAA ([facility])     Status: Abnormal   Collection Time: 02/06/19 12:00 AM   Specimen: Oropharyngeal(OP) collection in vial transport medium   OROPHARYNGEA  TESTING  Result Value Ref Range Status   SARS-CoV-2, NAA Detected (A) Not Detected Final    Comment: Testing was performed using the cobas(R) SARS-CoV-2 test. This nucleic acid amplification test was developed and its performance characteristics determined by [employer]. Nucleic acid amplification tests include PCR and TMA. This test has not been FDA cleared or approved. This test has been authorized by FDA under an Emergency Use Authorization (EUA). This test is only authorized for  the duration of time the declaration that circumstances exist justifying the authorization of the emergency use of in vitro diagnostic tests for detection of SARS-CoV-2 virus and/or diagnosis of COVID-19 infection under section 564(b)(1) of the Act, 21 U.S.C. [id number]) (1), unless the authorization is terminated or revoked sooner. When diagnostic testing is negative, the possibility of a false negative result should be considered in the context of a patient's recent exposures and the presence of clinical signs and symptoms consistent with COVID-19. An individual without symptoms  of COVID-19 and who is not shedding SARS-CoV-2 virus would expect to have a negative (not detected) result in this assay.   Culture, blood (Routine X 2) w Reflex  to ID Panel     Status: None (Preliminary result)   Collection Time: 02/08/19  8:27 PM   Specimen: Right Antecubital; Blood  Result Value Ref Range Status   Specimen Description RIGHT ANTECUBITAL  Final   Special Requests   Final    BOTTLES DRAWN AEROBIC AND ANAEROBIC Blood Culture adequate volume   Culture   Final    NO GROWTH < 12 HOURS Performed at [facility], [address], [place], [place] [address]    Report Status PENDING  Incomplete  Culture, blood (Routine X 2) w Reflex to ID Panel     Status: None (Preliminary result)   Collection Time: 02/09/19 12:59 AM   Specimen: BLOOD RIGHT FOREARM  Result Value Ref Range Status   Specimen Description BLOOD RIGHT FOREARM  Final   Special Requests   Final    BOTTLES DRAWN AEROBIC AND ANAEROBIC Blood Culture adequate volume   Culture   Final    NO GROWTH < 12 HOURS Performed at [facility], [address] [place], [place] [address]    Report Status PENDING  Incomplete  MRSA PCR Screening     Status: None   Collection Time: 02/09/19  4:04 AM   Specimen: Nasopharyngeal  Result Value Ref Range Status   MRSA by PCR NEGATIVE NEGATIVE Final    Comment:        The GeneXpert MRSA Assay (FDA approved for NASAL specimens only), is one component of a comprehensive MRSA colonization surveillance program. It is not intended to diagnose MRSA infection nor to guide or monitor treatment for MRSA infections. Performed at [facility], [place] [address] [place], [place] [address]          Radiology Studies: Ct Angio Chest Pe W Or Wo Contrast  Result Date: 02/08/2019 CLINICAL DATA:  Shortness of breath. Atrial fibrillation with RVR. COVID-19 positive. EXAM: CT ANGIOGRAPHY CHEST WITH CONTRAST TECHNIQUE: Multidetector CT imaging of the chest was performed using the standard protocol during bolus administration of intravenous contrast. Multiplanar CT image reconstructions and MIPs were obtained to evaluate the vascular anatomy.  CONTRAST:  51mL OMNIPAQUE IOHEXOL 350 MG/ML SOLN COMPARISON:  Radiograph earlier this day. FINDINGS: Cardiovascular: There are no filling defects within the pulmonary arteries to suggest pulmonary embolus. The thoracic aorta is normal in caliber. No aortic dissection. Conventional branching pattern from the aortic arch. The heart is normal in size. No pericardial effusion. Few coronary artery calcifications. Mediastinum/Nodes: Increased number of mildly enlarged bilateral hilar lymph nodes, 11 mm greatest dimension. No mediastinal adenopathy. No visualized thyroid nodule. Esophagus slightly patulous with small hiatal hernia. Lungs/Pleura: Multifocal bilateral ground-glass opacities in the peripheral and basilar predominant distribution. Mild biapical pleuroparenchymal scarring. There is retained mucus in the dependent trachea and mainstem bronchi. Mild central bronchial thickening. Questionable mild  pulmonary edema septal thickening at the bases. No confluent airspace disease. No pleural fluid. Upper Abdomen: No acute finding. Musculoskeletal: There are no acute or suspicious osseous abnormalities. Review of the MIP images confirms the above findings. IMPRESSION: 1. No pulmonary embolus. 2. Multifocal peripheral and basilar predominant ground-glass opacities in both lungs, pattern consistent with COVID-19 pneumonia. 3. Mild central bronchial thickening with retained mucus in the trachea and mainstem bronchi. 4. Questionable mild pulmonary edema at the bases septal thickening. 5. Mildly enlarged bilateral hilar lymph nodes are likely reactive, but nonspecific. Adenopathy in the setting of COVID-19 is unusual, and may suggest superimposed bacterial infection. Aortic Atherosclerosis (ICD10-I70.0). Electronically Signed   By: Keith Rake M.D.   On: 02/08/2019 23:32   Dg Chest Portable 1 View  Result Date: 02/08/2019 CLINICAL DATA:  Shortness of breath EXAM: PORTABLE CHEST 1 VIEW COMPARISON:  None. FINDINGS: No  focal opacity or pleural effusion. Borderline cardiomegaly with central vascular congestion. No pneumothorax. IMPRESSION: Borderline cardiomegaly with central vascular congestion Electronically Signed   By: Donavan Foil M.D.   On: 02/08/2019 21:14        Scheduled Meds: . atorvastatin  40 mg Oral q1800  . benztropine  1 mg Oral QHS  . Chlorhexidine Gluconate Cloth  6 each Topical Daily  . citalopram  20 mg Oral Daily  . clonazePAM  0.5 mg Oral BID  . cloNIDine  0.1 mg Oral Daily  . dexamethasone (DECADRON) injection  6 mg Intravenous Q24H  . hydrochlorothiazide  25 mg Oral Daily  . insulin aspart  0-9 Units Subcutaneous Q4H  . losartan  100 mg Oral Daily  . multivitamin with minerals  1 tablet Oral Daily  . mupirocin ointment  1 application Topical QPM  . pantoprazole  40 mg Oral Daily  . QUEtiapine  100 mg Oral QHS  . risperiDONE  1 mg Oral QHS  . vitamin B-12  2,000 mcg Oral Daily   Continuous Infusions: . azithromycin Stopped (02/09/19 0309)  . diltiazem (CARDIZEM) infusion 5 mg/hr (02/09/19 1212)  . heparin 1,100 Units/hr (02/09/19 1212)  . [START ON 02/10/2019] remdesivir 100 mg in NS 250 mL       LOS: 1 day     Vernell Leep, MD, [place], [facility]. [facility]  To contact the attending provider between 7A-7P or the covering provider during after hours 7P-7A, please log into the web site [email or web address] and access using universal  password for that web site. If you do not have the password, please call the hospital operator.  02/09/2019, 4:02 PM

## 2019-02-10 LAB — CBC WITH DIFFERENTIAL/PLATELET
Abs Immature Granulocytes: 0.04 10*3/uL (ref 0.00–0.07)
Basophils Absolute: 0 10*3/uL (ref 0.0–0.1)
Basophils Relative: 0 %
Eosinophils Absolute: 0 10*3/uL (ref 0.0–0.5)
Eosinophils Relative: 0 %
HCT: 40.2 % (ref 39.0–52.0)
Hemoglobin: 13.8 g/dL (ref 13.0–17.0)
Immature Granulocytes: 1 %
Lymphocytes Relative: 7 %
Lymphs Abs: 0.5 10*3/uL — ABNORMAL LOW (ref 0.7–4.0)
MCH: 29.6 pg (ref 26.0–34.0)
MCHC: 34.3 g/dL (ref 30.0–36.0)
MCV: 86.3 fL (ref 80.0–100.0)
Monocytes Absolute: 0.7 10*3/uL (ref 0.1–1.0)
Monocytes Relative: 9 %
Neutro Abs: 5.8 10*3/uL (ref 1.7–7.7)
Neutrophils Relative %: 83 %
Platelets: 155 10*3/uL (ref 150–400)
RBC: 4.66 MIL/uL (ref 4.22–5.81)
RDW: 13.3 % (ref 11.5–15.5)
WBC: 7 10*3/uL (ref 4.0–10.5)
nRBC: 0 % (ref 0.0–0.2)

## 2019-02-10 LAB — BRAIN NATRIURETIC PEPTIDE: B Natriuretic Peptide: 62.4 pg/mL (ref 0.0–100.0)

## 2019-02-10 LAB — BASIC METABOLIC PANEL
Anion gap: 15 (ref 5–15)
BUN: 24 mg/dL — ABNORMAL HIGH (ref 8–23)
CO2: 18 mmol/L — ABNORMAL LOW (ref 22–32)
Calcium: 8.4 mg/dL — ABNORMAL LOW (ref 8.9–10.3)
Chloride: 102 mmol/L (ref 98–111)
Creatinine, Ser: 1.19 mg/dL (ref 0.61–1.24)
GFR calc Af Amer: >60 mL/min (ref >60)
GFR calc non Af Amer: >60 mL/min (ref >60)
Glucose, Bld: 270 mg/dL — ABNORMAL HIGH (ref 70–99)
Potassium: 4.7 mmol/L (ref 3.5–5.1)
Sodium: 135 mmol/L (ref 135–145)

## 2019-02-10 LAB — HEPARIN LEVEL (UNFRACTIONATED): Heparin Unfractionated: 0.52 IU/mL (ref 0.30–0.70)

## 2019-02-10 LAB — GLUCOSE, CAPILLARY
Glucose-Capillary: 284 mg/dL — ABNORMAL HIGH (ref 70–99)
Glucose-Capillary: 341 mg/dL — ABNORMAL HIGH (ref 70–99)
Glucose-Capillary: 278 mg/dL — ABNORMAL HIGH (ref 70–99)
Glucose-Capillary: 138 mg/dL — ABNORMAL HIGH (ref 70–99)

## 2019-02-10 LAB — C-REACTIVE PROTEIN: CRP: 5.2 mg/dL — ABNORMAL HIGH (ref <1.0)

## 2019-02-10 LAB — FERRITIN: Ferritin: 418 ng/mL — ABNORMAL HIGH (ref 24–336)

## 2019-02-10 LAB — T4, FREE: Free T4: 1.53 ng/dL — ABNORMAL HIGH (ref 0.61–1.12)

## 2019-02-10 LAB — LACTATE DEHYDROGENASE: LDH: 229 U/L — ABNORMAL HIGH (ref 98–192)

## 2019-02-10 MED ORDER — SENNA 8.6 MG PO TABS
2.0000 | ORAL_TABLET | Freq: Every day | ORAL | Status: DC
  Administered 2019-02-10 – 2019-02-13 (×4): 17.2 mg via ORAL
  Filled 2019-02-10 (×4): qty 2

## 2019-02-10 MED ORDER — INSULIN GLARGINE 100 UNIT/ML ~~LOC~~ SOLN
5.0000 [IU] | Freq: Every day | SUBCUTANEOUS | Status: DC
  Administered 2019-02-10: 5 [IU] via SUBCUTANEOUS
  Filled 2019-02-10: qty 0.05

## 2019-02-10 MED ORDER — DILTIAZEM HCL ER COATED BEADS 120 MG PO CP24
120.0000 mg | ORAL_CAPSULE | Freq: Every day | ORAL | Status: DC
  Administered 2019-02-10 – 2019-02-11 (×2): 120 mg via ORAL
  Filled 2019-02-10 (×2): qty 1

## 2019-02-10 MED ORDER — INSULIN ASPART 100 UNIT/ML ~~LOC~~ SOLN
5.0000 [IU] | Freq: Three times a day (TID) | SUBCUTANEOUS | Status: DC
  Administered 2019-02-10 – 2019-02-13 (×8): 5 [IU] via SUBCUTANEOUS

## 2019-02-10 MED ORDER — APIXABAN 5 MG PO TABS
5.0000 mg | ORAL_TABLET | Freq: Two times a day (BID) | ORAL | Status: DC
  Administered 2019-02-10 – 2019-02-13 (×7): 5 mg via ORAL
  Filled 2019-02-10 (×7): qty 1

## 2019-02-10 MED ORDER — BISACODYL 10 MG RE SUPP: 10.0000 mg | Freq: Every day | RECTAL | Status: DC | PRN

## 2019-02-10 MED ORDER — POLYETHYLENE GLYCOL 3350 17 G PO PACK
17.0000 g | PACK | Freq: Every day | ORAL | Status: DC
  Administered 2019-02-10 – 2019-02-13 (×4): 17 g via ORAL
  Filled 2019-02-10 (×4): qty 1

## 2019-02-10 MED ORDER — INSULIN GLARGINE 100 UNIT/ML ~~LOC~~ SOLN
8.0000 [IU] | Freq: Two times a day (BID) | SUBCUTANEOUS | Status: DC
  Administered 2019-02-10 – 2019-02-11 (×2): 8 [IU] via SUBCUTANEOUS
  Filled 2019-02-10 (×3): qty 0.08

## 2019-02-10 NOTE — Care Management (Signed)
Pt has PCP with [facility] clinic.  CM spoke with RN transfer coordinator Talbert Nan at [phone] ext [phone] - made agency aware of admit - no transfer paper work requested from [place]

## 2019-02-10 NOTE — Progress Notes (Signed)
Report called to [facility]

## 2019-02-10 NOTE — Progress Notes (Signed)
ANTICOAGULATION CONSULT NOTE - Follow-Up  Pharmacy Consult for heparin Indication: atrial fibrillation  No Known Allergies  Patient Measurements: Height: 5\' 11"  (180.3 cm) Weight: 191 lb 2.2 oz (86.7 kg) IBW/kg (Calculated) : 75.3 HEPARIN DW (KG): 85.3   Vital Signs: Temp: 98.2 F (36.8 C) (09/28 0747) Temp Source: Oral (09/28 0747) BP: 137/67 (09/28 0800) Pulse Rate: 108 (09/28 0800)  Labs: Recent Labs    02/08/19 2053 02/09/19 0059 02/09/19 0633 02/09/19 1710 02/10/19 0552  HGB 14.0 13.9  --   --  13.8  HCT 43.4 42.8  --   --  40.2  PLT 145* 131*  --   --  155  APTT  --  35  --   --   --   LABPROT  --  13.2  --   --   --   INR  --  1.0  --   --   --   HEPARINUNFRC  --   --  1.07* 0.65 0.52  CREATININE 1.07 0.96  --   --  1.19   Estimated Creatinine Clearance: 60.6 mL/min (by C-G formula based on SCr of 1.19 mg/dL).   Assessment: 73 yo male with SOB, fever and aches.  Covid + 2 days ago.  Patient found to have rapid atrial fibrillation  Hep lvl within goal  CBC stable  Goal of Therapy:  Heparin level 0.3-0.7 units/ml   Plan:  -Continue heparin 1100 units/hr -Daily heparin level and CBC  Levester Fresh, PharmD, BCPS, BCCCP Clinical Pharmacist [phone]  Please check AMION for all [facility] numbers  02/10/2019 8:29 AM

## 2019-02-10 NOTE — Progress Notes (Signed)
Wife came to pts bed side to speak with pt, no call needed, wife knows of pts care, will keep monitoring pt

## 2019-02-10 NOTE — Progress Notes (Signed)
Called daughter and informed her that pt was going to transfer to [facility].

## 2019-02-10 NOTE — Progress Notes (Signed)
PROGRESS NOTE   Darrell Lang  [id number]    DOB: 1945-10-26    DOA: 02/08/2019  PCP: System, Pcp Not In   I have briefly reviewed patients previous medical records in [facility].  Chief Complaint  Patient presents with   Shortness of Breath    COVID positive    Brief Narrative:  73 year old male with PMH of HTN, HLD, DM 2, anxiety and depression presented with complaints of fever, dry cough and dyspnea.  Now diagnosed with COVID-19 pneumonia, hypoxia, new onset A. fib.  Started on IV Cardizem and heparin drip.  Rate controlled.  Transitioned to oral Cardizem and consolidated to Cardizem CD.  Initiated Eliquis.  Transferring to [place] 9/28.   Assessment & Plan:   Principal Problem:   Atrial fibrillation with RVR ([place]) Active Problems:   COVID-19 virus infection   Essential hypertension   Hyperlipidemia   Diabetes (HCC)   A. fib with RVR  Appears new onset.  TTE 9/27: LVEF 55-60%.  Troponin x2: Unremarkable.  TSH: 0.299 (low).  Checking free T3 and free T4.  Placed on IV Cardizem drip with good rate control.  Transitioned to oral short-acting Cardizem 30 mg every 6 hourly.  Rate remains controlled.  Consolidated to Cardizem CD 120 mg daily.  Initially treated with IV heparin drip.  Discussed in detail regarding anticoagulation with patient and then with his daughter via phone, provided various options including Coumadin, Xarelto and Eliquis and Pradaxa.  They opted for Eliquis which was initiated.  CHA2DS2-VASc score: 3 (age, HTN and DM)  Rate controlled and stable.  Recommend outpatient follow-up with cardiology.  COVID-19 pneumonia  SARS coronavirus 2 testing positive on 9/24.  D-dimer positive.  CTA chest: No pulmonary embolism.  Multifocal peripheral and basilar predominant groundglass opacities in both lungs consistent with COVID-19 pneumonia.  LDH: 232, ferritin 283, CRP 6.9, procalcitonin negative and d-dimer 0.63.  Continue  IV Decadron 6 mg every 24 hours.  Added Remdesivir per pharmacy.  Continue airborne and contact precautions.  Mildly hypoxic on room air.  Otherwise stable.  Acute hypoxic respiratory failure  Desaturates to 88% on room air.  Continue oxygen support.  Secondary to COVID pneumonia.  Titrate oxygen as needed to maintain saturations greater than 92%.  Essential hypertension  Controlled  Discontinued HCTZ and clonidine this admission to avoid hypotension from polypharmacy especially since starting new Cardizem.  Continue ARB and Cardizem.  Hyperlipidemia  Continue statins.  Type II DM  Hold metformin.  A1c 8.6 on 9/27 suggesting poor outpatient control.  Now worsened by ongoing IV steroids.  Up to 341 this morning.  DM coordinator input appreciated.  Increased Lantus to 8 units twice daily, continue NovoLog moderate sensitivity sliding scale and added mealtime NovoLog 5 units.  GERD  Continue PPI.  Anxiety and depression  Appears stable.  Continue PTA polypharmacy including Celexa 20 mg daily, clonazepam 0.5 mg twice daily, Seroquel 100 mg at bedtime, Risperdal 1 mg at bedtime and benztropine 1 mg at bedtime.  EKG 02/08/2019 with QTC of 442 ms.  Monitor EKG for QTC periodically.  Hyponatremia  Likely related to HCTZ, DC and follow.  Resolved.  Thrombocytopenia  Baseline not available.  Could be related to COVID infection.  Resolved.  Hard of hearing  Abnormal TSH/low  Clinically euthyroid.  Follow-up free T3 and free T4.    DVT prophylaxis: Eliquis Code Status: Full Family Communication: Discussed in detail with patient's daughter via phone, updated care and answered questions.  Patient spouse is hospitalized at the [facility] campus.  Advised her that patient will also be transferred to [facility] today. Disposition: Patient will be transferred from [facility] to [facility] hospital 9/27.  I discussed with MD there and bed is available.   Consultants:  None  Procedures:   EEG 02/09/2019:  IMPRESSIONS    1. Left ventricular ejection fraction, by visual estimation, is 55 to 60%. The left ventricle has normal function. Normal left ventricular size. There is mild left ventricular hypertrophy.  2. Global right ventricle has normal systolic function.The right ventricular size is normal. No increase in right ventricular wall thickness.  3. Left atrial size was normal.  4. Right atrial size was normal.  5. The mitral valve is normal in structure. No evidence of mitral valve regurgitation.  6. The tricuspid valve is grossly normal. Tricuspid valve regurgitation is trivial.  7. The aortic valve is normal in structure. Aortic valve regurgitation was not visualized by color flow Doppler.  8. The pulmonic valve was not well visualized. Pulmonic valve regurgitation is not visualized by color flow Doppler.  9. TR signal is inadequate for assessing pulmonary artery systolic pressure. 10. The inferior vena cava is dilated in size with >50% respiratory variability, suggesting right atrial pressure of 8 mmHg.   Antimicrobials:  None   Subjective: Mild cough but denies dyspnea or chest pain.  No palpitations, dizziness or lightheadedness.  Eating well.  Denies any other complaints.  States that he lives at his home with his wife, independent, no history of falls or bleeding issues.  Objective:  Vitals:   02/10/19 1000 02/10/19 1100 02/10/19 1200 02/10/19 1205  BP: 135/68 124/66  133/63  Pulse: 98 98 (!) 105 (!) 103  Resp: (!) 28 (!) 21 (!) 26 (!) 23  Temp:      TempSrc:      SpO2: 90% 93% (!) 89% 90%  Weight:      Height:        Examination:  General exam: Pleasant elderly male, moderately built and nourished sitting up comfortably in bed without distress. Respiratory system: Occasional basal crackles but otherwise clear to auscultation.  No increased work of breathing. Cardiovascular system: S1 and S2 heard, irregularly irregular.  No JVD, murmurs or pedal  edema.  Telemetry personally reviewed: A. fib with controlled ventricular rate. Gastrointestinal system: Abdomen is nondistended, soft and nontender. No organomegaly or masses felt. Normal bowel sounds heard. Central nervous system: Alert and oriented. No focal neurological deficits.  Hard of hearing. Extremities: Symmetric 5 x 5 power. Skin: No rashes, lesions or ulcers Psychiatry: Judgement and insight appear normal. Mood & affect appropriate.     Data Reviewed: I have personally reviewed following labs and imaging studies  CBC: Recent Labs  Lab 02/08/19 2053 02/09/19 0059 02/10/19 0552  WBC 4.5 4.1 7.0  NEUTROABS 3.2 3.6 5.8  HGB 14.0 13.9 13.8  HCT 43.4 42.8 40.2  MCV 88.2 88.6 86.3  PLT 145* 131* [id number]   Basic Metabolic Panel: Recent Labs  Lab 02/08/19 2053 02/09/19 0059 02/10/19 0552  NA 133* 130* 135  K 3.9 4.4 4.7  CL 99 99 102  CO2 22 19* 18*  GLUCOSE 139* 191* 270*  BUN 12 12 24*  CREATININE 1.07 0.96 1.19  CALCIUM 8.4* 8.2* 8.4*   Liver Function Tests: Recent Labs  Lab 02/08/19 2053 02/09/19 0059  AST 36 34  ALT 25 25  ALKPHOS 60 59  BILITOT 0.8 1.3*  PROT 7.5 7.5  ALBUMIN 4.0  4.0    Cardiac Enzymes: No results for input(s): CKTOTAL, CKMB, CKMBINDEX, TROPONINI in the last 168 hours.  CBG: Recent Labs  Lab 02/09/19 1106 02/09/19 1724 02/09/19 2203 02/10/19 0738 02/10/19 1154  GLUCAP 290* 206* 264* 284* 341*    Recent Results (from the past 240 hour(s))  Novel Coronavirus, NAA ([facility])     Status: Abnormal   Collection Time: 02/06/19 12:00 AM   Specimen: Oropharyngeal(OP) collection in vial transport medium   OROPHARYNGEA  TESTING  Result Value Ref Range Status   SARS-CoV-2, NAA Detected (A) Not Detected Final    Comment: Testing was performed using the cobas(R) SARS-CoV-2 test. This nucleic acid amplification test was developed and its performance characteristics determined by [employer]. Nucleic acid amplification tests  include PCR and TMA. This test has not been FDA cleared or approved. This test has been authorized by FDA under an Emergency Use Authorization (EUA). This test is only authorized for the duration of time the declaration that circumstances exist justifying the authorization of the emergency use of in vitro diagnostic tests for detection of SARS-CoV-2 virus and/or diagnosis of COVID-19 infection under section 564(b)(1) of the Act, 21 U.S.C. [id number]) (1), unless the authorization is terminated or revoked sooner. When diagnostic testing is negative, the possibility of a false negative result should be considered in the context of a patient's recent exposures and the presence of clinical signs and symptoms consistent with COVID-19. An individual without symptoms  of COVID-19 and who is not shedding SARS-CoV-2 virus would expect to have a negative (not detected) result in this assay.   Culture, blood (Routine X 2) w Reflex to ID Panel     Status: None (Preliminary result)   Collection Time: 02/08/19  8:27 PM   Specimen: Right Antecubital; Blood  Result Value Ref Range Status   Specimen Description RIGHT ANTECUBITAL  Final   Special Requests   Final    BOTTLES DRAWN AEROBIC AND ANAEROBIC Blood Culture adequate volume   Culture   Final    NO GROWTH 2 DAYS Performed at [facility], [address] [place], [place] [address]    Report Status PENDING  Incomplete  Culture, blood (Routine X 2) w Reflex to ID Panel     Status: None (Preliminary result)   Collection Time: 02/09/19 12:59 AM   Specimen: BLOOD RIGHT FOREARM  Result Value Ref Range Status   Specimen Description BLOOD RIGHT FOREARM  Final   Special Requests   Final    BOTTLES DRAWN AEROBIC AND ANAEROBIC Blood Culture adequate volume   Culture   Final    NO GROWTH 1 DAY Performed at [facility], [address] [place], [place] [address]    Report Status PENDING  Incomplete  MRSA PCR Screening     Status: None   Collection  Time: 02/09/19  4:04 AM   Specimen: Nasopharyngeal  Result Value Ref Range Status   MRSA by PCR NEGATIVE NEGATIVE Final    Comment:        The GeneXpert MRSA Assay (FDA approved for NASAL specimens only), is one component of a comprehensive MRSA colonization surveillance program. It is not intended to diagnose MRSA infection nor to guide or monitor treatment for MRSA infections. Performed at [facility], [place] [address] [place], [place] [address]          Radiology Studies: Ct Angio Chest Pe W Or Wo Contrast  Result Date: 02/08/2019 CLINICAL DATA:  Shortness of breath. Atrial fibrillation with RVR.  COVID-19 positive. EXAM: CT ANGIOGRAPHY CHEST WITH CONTRAST TECHNIQUE: Multidetector CT imaging of the chest was performed using the standard protocol during bolus administration of intravenous contrast. Multiplanar CT image reconstructions and MIPs were obtained to evaluate the vascular anatomy. CONTRAST:  56mL OMNIPAQUE IOHEXOL 350 MG/ML SOLN COMPARISON:  Radiograph earlier this day. FINDINGS: Cardiovascular: There are no filling defects within the pulmonary arteries to suggest pulmonary embolus. The thoracic aorta is normal in caliber. No aortic dissection. Conventional branching pattern from the aortic arch. The heart is normal in size. No pericardial effusion. Few coronary artery calcifications. Mediastinum/Nodes: Increased number of mildly enlarged bilateral hilar lymph nodes, 11 mm greatest dimension. No mediastinal adenopathy. No visualized thyroid nodule. Esophagus slightly patulous with small hiatal hernia. Lungs/Pleura: Multifocal bilateral ground-glass opacities in the peripheral and basilar predominant distribution. Mild biapical pleuroparenchymal scarring. There is retained mucus in the dependent trachea and mainstem bronchi. Mild central bronchial thickening. Questionable mild pulmonary edema septal thickening at the bases. No confluent airspace disease. No pleural fluid.  Upper Abdomen: No acute finding. Musculoskeletal: There are no acute or suspicious osseous abnormalities. Review of the MIP images confirms the above findings. IMPRESSION: 1. No pulmonary embolus. 2. Multifocal peripheral and basilar predominant ground-glass opacities in both lungs, pattern consistent with COVID-19 pneumonia. 3. Mild central bronchial thickening with retained mucus in the trachea and mainstem bronchi. 4. Questionable mild pulmonary edema at the bases septal thickening. 5. Mildly enlarged bilateral hilar lymph nodes are likely reactive, but nonspecific. Adenopathy in the setting of COVID-19 is unusual, and may suggest superimposed bacterial infection. Aortic Atherosclerosis (ICD10-I70.0). Electronically Signed   By: Keith Rake M.D.   On: 02/08/2019 23:32   Dg Chest Portable 1 View  Result Date: 02/08/2019 CLINICAL DATA:  Shortness of breath EXAM: PORTABLE CHEST 1 VIEW COMPARISON:  None. FINDINGS: No focal opacity or pleural effusion. Borderline cardiomegaly with central vascular congestion. No pneumothorax. IMPRESSION: Borderline cardiomegaly with central vascular congestion Electronically Signed   By: Donavan Foil M.D.   On: 02/08/2019 21:14        Scheduled Meds:  apixaban  5 mg Oral BID   atorvastatin  40 mg Oral q1800   benztropine  1 mg Oral QHS   chlorhexidine  15 mL Mouth Rinse BID   Chlorhexidine Gluconate Cloth  6 each Topical Daily   citalopram  20 mg Oral Daily   clonazePAM  0.5 mg Oral BID   dexamethasone (DECADRON) injection  6 mg Intravenous Q24H   diltiazem  120 mg Oral Daily   insulin aspart  0-15 Units Subcutaneous TID WC   insulin aspart  0-5 Units Subcutaneous QHS   insulin aspart  5 Units Subcutaneous TID WC   insulin glargine  8 Units Subcutaneous BID   losartan  100 mg Oral Daily   mouth rinse  15 mL Mouth Rinse q12n4p   multivitamin with minerals  1 tablet Oral Daily   mupirocin ointment  1 application Topical QPM    pantoprazole  40 mg Oral Daily   polyethylene glycol  17 g Oral Daily   QUEtiapine  100 mg Oral QHS   risperiDONE  1 mg Oral QHS   senna  2 tablet Oral Daily   vitamin B-12  2,000 mcg Oral Daily   Continuous Infusions:  remdesivir 100 mg in NS 250 mL 100 mg (02/10/19 1211)     LOS: 2 days     Vernell Leep, MD, FACP, [facility]. [facility]  To contact the attending provider between 7A-7P  or the covering provider during after hours 7P-7A, please log into the web site [email or web address] and access using universal [place] password for that web site. If you do not have the password, please call the hospital operator.  02/10/2019, 1:04 PM

## 2019-02-10 NOTE — TOC Benefit Eligibility Note (Signed)
Transition of Care [facility]) Benefit Eligibility Note    Patient Details  Name: Darrell Lang MRN: [id number] Date of Birth: 1945/07/12   Medication/Dose: Arne Cleveland  2.5 MG BID   ELIQUIS  5MG  BID        Prescription Coverage Preferred Pharmacy: [facility]              Additional Notes: PATIENT HAS [facility] BENEFITS    Memory Argue Phone Number: 02/10/2019, 3:08 PM

## 2019-02-10 NOTE — Progress Notes (Signed)
Patient desaturates to 88 without oxygen .Remains on 3 L n/c.

## 2019-02-10 NOTE — Progress Notes (Signed)
Inpatient Diabetes Program Recommendations  AACE/ADA: New Consensus Statement on Inpatient Glycemic Control (2015)  Target Ranges:  Prepandial:   less than 140 mg/dL      Peak postprandial:   less than 180 mg/dL (1-2 hours)      Critically ill patients:  140 - 180 mg/dL     Review of Glycemic Control Results for RHYETT, PEETE (MRN [id number]) as of 02/10/2019 12:14  Ref. Range 02/09/2019 07:33 02/09/2019 11:06 02/09/2019 17:24 02/09/2019 22:03 02/10/2019 07:38 02/10/2019 11:54  Glucose-Capillary Latest Ref Range: 70 - 99 mg/dL 164 (H) 290 (H) 206 (H) 264 (H) 284 (H) 341 (H)   Diabetes history: DM 2 Outpatient Diabetes medications: Metformin 1000 mg bid Current orders for Inpatient glycemic control:  Lantus 5 units Daily Novolog 0-15 units tid Novolog 0-5 units qhs  Decadron 6 mg Daily A1c 8.6% on 9/27  Inpatient Diabetes Program Recommendations:    Noted Lantus 5 units started this am. COVID + on Decadron 6 mg Daily.   May consider Lantus 8 units bid.  May also need Novolog 5 units tid meal coverage if postprandial increase.  Thanks,  Tama Headings RN, MSN, BC-ADM Inpatient Diabetes Coordinator Team Pager [phone] (8a-5p)

## 2019-02-10 NOTE — Progress Notes (Signed)
Patient transferred to [facility] via [place] . I have informed his daughter of the transfer and room number

## 2019-02-11 DIAGNOSIS — I4891 Unspecified atrial fibrillation: Secondary | ICD-10-CM

## 2019-02-11 LAB — CBC WITH DIFFERENTIAL/PLATELET
Abs Immature Granulocytes: 0.03 10*3/uL (ref 0.00–0.07)
Basophils Absolute: 0 10*3/uL (ref 0.0–0.1)
Basophils Relative: 0 %
Eosinophils Absolute: 0 10*3/uL (ref 0.0–0.5)
Eosinophils Relative: 0 %
HCT: 40 % (ref 39.0–52.0)
Hemoglobin: 13 g/dL (ref 13.0–17.0)
Immature Granulocytes: 0 %
Lymphocytes Relative: 7 %
Lymphs Abs: 0.5 10*3/uL — ABNORMAL LOW (ref 0.7–4.0)
MCH: 28.8 pg (ref 26.0–34.0)
MCHC: 32.5 g/dL (ref 30.0–36.0)
MCV: 88.7 fL (ref 80.0–100.0)
Monocytes Absolute: 0.8 10*3/uL (ref 0.1–1.0)
Monocytes Relative: 12 %
Neutro Abs: 5.4 10*3/uL (ref 1.7–7.7)
Neutrophils Relative %: 81 %
Platelets: 201 10*3/uL (ref 150–400)
RBC: 4.51 MIL/uL (ref 4.22–5.81)
RDW: 13.5 % (ref 11.5–15.5)
WBC: 6.7 10*3/uL (ref 4.0–10.5)
nRBC: 0 % (ref 0.0–0.2)

## 2019-02-11 LAB — FERRITIN: Ferritin: 326 ng/mL (ref 24–336)

## 2019-02-11 LAB — GLUCOSE, CAPILLARY
Glucose-Capillary: 326 mg/dL — ABNORMAL HIGH (ref 70–99)
Glucose-Capillary: 281 mg/dL — ABNORMAL HIGH (ref 70–99)
Glucose-Capillary: 192 mg/dL — ABNORMAL HIGH (ref 70–99)
Glucose-Capillary: 179 mg/dL — ABNORMAL HIGH (ref 70–99)

## 2019-02-11 LAB — MAGNESIUM: Magnesium: 2.7 mg/dL — ABNORMAL HIGH (ref 1.7–2.4)

## 2019-02-11 LAB — COMPREHENSIVE METABOLIC PANEL
ALT: 16 U/L (ref 0–44)
AST: 18 U/L (ref 15–41)
Albumin: 3.5 g/dL (ref 3.5–5.0)
Alkaline Phosphatase: 49 U/L (ref 38–126)
Anion gap: 12 (ref 5–15)
BUN: 26 mg/dL — ABNORMAL HIGH (ref 8–23)
CO2: 21 mmol/L — ABNORMAL LOW (ref 22–32)
Calcium: 8.4 mg/dL — ABNORMAL LOW (ref 8.9–10.3)
Chloride: 104 mmol/L (ref 98–111)
Creatinine, Ser: 0.92 mg/dL (ref 0.61–1.24)
GFR calc Af Amer: >60 mL/min (ref >60)
GFR calc non Af Amer: >60 mL/min (ref >60)
Glucose, Bld: 153 mg/dL — ABNORMAL HIGH (ref 70–99)
Potassium: 4.2 mmol/L (ref 3.5–5.1)
Sodium: 137 mmol/L (ref 135–145)
Total Bilirubin: 0.7 mg/dL (ref 0.3–1.2)
Total Protein: 6.6 g/dL (ref 6.5–8.1)

## 2019-02-11 LAB — LACTATE DEHYDROGENASE: LDH: 222 U/L — ABNORMAL HIGH (ref 98–192)

## 2019-02-11 LAB — D-DIMER, QUANTITATIVE: D-Dimer, Quant: 0.38 ug/mL-FEU (ref 0.00–0.50)

## 2019-02-11 LAB — TSH: TSH: 0.207 u[IU]/mL — ABNORMAL LOW (ref 0.350–4.500)

## 2019-02-11 LAB — TYPE AND SCREEN
ABO/RH(D): O POS
Antibody Screen: NEGATIVE

## 2019-02-11 LAB — T3, FREE: T3, Free: 2.3 pg/mL (ref 2.0–4.4)

## 2019-02-11 LAB — T4, FREE: Free T4: 1.6 ng/dL — ABNORMAL HIGH (ref 0.61–1.12)

## 2019-02-11 LAB — C-REACTIVE PROTEIN: CRP: 3.1 mg/dL — ABNORMAL HIGH (ref <1.0)

## 2019-02-11 LAB — BRAIN NATRIURETIC PEPTIDE: B Natriuretic Peptide: 49.5 pg/mL (ref 0.0–100.0)

## 2019-02-11 MED ORDER — INSULIN GLARGINE 100 UNIT/ML ~~LOC~~ SOLN
30.0000 [IU] | Freq: Every day | SUBCUTANEOUS | Status: DC
  Administered 2019-02-11 – 2019-02-13 (×3): 30 [IU] via SUBCUTANEOUS
  Filled 2019-02-11 (×3): qty 0.3

## 2019-02-11 MED ORDER — HALOPERIDOL LACTATE 5 MG/ML IJ SOLN
2.0000 mg | Freq: Four times a day (QID) | INTRAMUSCULAR | Status: DC | PRN
  Administered 2019-02-12: 2 mg via INTRAVENOUS
  Filled 2019-02-11: qty 1

## 2019-02-11 MED ORDER — INSULIN ASPART 100 UNIT/ML ~~LOC~~ SOLN
0.0000 [IU] | Freq: Three times a day (TID) | SUBCUTANEOUS | Status: DC
  Administered 2019-02-11: 17:00:00 4 [IU] via SUBCUTANEOUS
  Administered 2019-02-11: 11 [IU] via SUBCUTANEOUS
  Administered 2019-02-12 – 2019-02-13 (×2): 7 [IU] via SUBCUTANEOUS
  Administered 2019-02-13: 09:00:00 3 [IU] via SUBCUTANEOUS

## 2019-02-11 MED ORDER — QUETIAPINE FUMARATE 25 MG PO TABS: 25.0000 mg | ORAL_TABLET | Freq: Every day | ORAL | Status: DC

## 2019-02-11 MED ORDER — DILTIAZEM HCL ER COATED BEADS 120 MG PO CP24
120.0000 mg | ORAL_CAPSULE | Freq: Once | ORAL | Status: AC
  Administered 2019-02-11: 120 mg via ORAL
  Filled 2019-02-11: qty 1

## 2019-02-11 MED ORDER — INSULIN ASPART 100 UNIT/ML ~~LOC~~ SOLN
0.0000 [IU] | Freq: Every day | SUBCUTANEOUS | Status: DC
  Administered 2019-02-12: 21:00:00 5 [IU] via SUBCUTANEOUS

## 2019-02-11 MED ORDER — DILTIAZEM HCL ER COATED BEADS 120 MG PO CP24
240.0000 mg | ORAL_CAPSULE | Freq: Every day | ORAL | Status: DC
  Administered 2019-02-12: 240 mg via ORAL
  Filled 2019-02-11: qty 2

## 2019-02-11 MED ORDER — QUETIAPINE FUMARATE 25 MG PO TABS: 50.0000 mg | ORAL_TABLET | Freq: Every day | ORAL | Status: DC

## 2019-02-11 MED ORDER — QUETIAPINE FUMARATE 25 MG PO TABS
50.0000 mg | ORAL_TABLET | Freq: Every day | ORAL | Status: DC
  Administered 2019-02-11 – 2019-02-13 (×3): 50 mg via ORAL
  Filled 2019-02-11 (×3): qty 2

## 2019-02-11 MED ORDER — QUETIAPINE FUMARATE 25 MG PO TABS
100.0000 mg | ORAL_TABLET | Freq: Every day | ORAL | Status: DC
  Administered 2019-02-11 – 2019-02-12 (×2): 100 mg via ORAL
  Filled 2019-02-11 (×2): qty 4

## 2019-02-11 NOTE — Progress Notes (Signed)
This nurse attempted to call patients wife [name] but no answer at this time. Message left on voicemail with call back number if wife so chooses to call back.

## 2019-02-11 NOTE — Progress Notes (Signed)
PROGRESS NOTE                                                                                                                                                                                                             Patient Demographics:    Darrell Lang, is a 73 y.o. male, DOB - November 04, 1945, ASN:[id number]  Outpatient Primary MD for the patient is System, Pcp Not In    LOS - 3  Admit date - 02/08/2019    Chief Complaint  Patient presents with  . Shortness of Breath    COVID positive       Brief Narrative - 73 year old male with PMH of HTN, HLD, DM 2, anxiety and depression presented with complaints of fever, dry cough and dyspnea.  Now diagnosed with COVID-19 pneumonia, hypoxia, new onset A. fib.  Started on IV Cardizem and heparin drip.  Rate controlled.  Transitioned to oral Cardizem and consolidated to Cardizem CD.  Initiated Eliquis.  Transferring to [place] [facility] 9/28.   Subjective:    Darrell Lang today has, No headache, No chest pain, No abdominal pain - No Nausea, No new weakness tingling or numbness, No Cough - SOB.  He is confused today and overall is unreliable historian.   Assessment  & Plan :     1. Acute Hypoxic Resp. Failure due to Acute Covid 19 Viral Pneumonitis during the ongoing 2020 Covid 19 Pandemic - he has been treated with steroids along with IV Remdisvir starting on 02/09/2019.  Seems to have responded the treatment well, inflammatory markers are stable and he is currently stable on room air.  Continue supportive care.   [place]    02/08/19 2053 02/08/19 2059 02/10/19 1105 02/11/19 0255  DDIMER 0.63*  --   --  0.38  FERRITIN  --  283 418* 326  LDH 232*  --  229* 222*  CRP  --  6.9* 5.2* 3.1*    Lab Results  Component Value Date   SARSCOV2NAA Detected (A) 02/06/2019     Hepatic Function Latest Ref Rng & Units 02/11/2019 02/09/2019 02/08/2019  Total  Protein 6.5 - 8.1 g/dL 6.6 7.5 7.5  Albumin 3.5 - 5.0 g/dL 3.5 4.0 4.0  AST 15 - 41 U/L 18 34 36  ALT 0 - 44 U/L _0 Alk  Phosphatase 38 - 126 U/L 49 59 60  Total Bilirubin 0.3 - 1.2 mg/dL 0.7 1.3(H) 0.8        Component Value Date/Time   BNP 49.5 02/11/2019 0255      2.  New onset A. fib with RVR [place] vas 2 score of at least 3.  Echocardiogram noted with preserved EF, currently on Cardizem for rate control, dose adjusted on 02/11/2019 for better control, Eliquis for anticoagulation.  Noted his TSH to be slightly low.  Will repeat TSH along with free T4 and T3.  3.  Essential hypertension.  Currently on combination of ARB and Cardizem.  Will monitor and adjust.  4.  Dyslipidemia.  Stable on statin.  5.  Mildly low TSH.  Could be sick euthyroid, repeat TSH along with free T4 and T3.   6.  GERD.  On PPI continue.  8.  Severe toxic encephalopathy/hospital-acquired delirium.  Placed on Seroquel, dose adjusted, PRN Haldol added.  Minimize narcotics and benzodiazepines.  Sitter for now.  9.  Anxiety and depression.  Continue Celexa.  10.  DM type II.  Poor outpatient control due to hyperglycemia A1c was 8.6.  Currently on combination of Lantus and sliding scale.  Lantus dose increased and pre-meal NovoLog added on 02/11/2019 for better control.  Lab Results  Component Value Date   HGBA1C 8.6 (H) 02/09/2019   CBG (last 3)  Recent Labs    02/10/19 1645 02/10/19 2108 02/11/19 0740  GLUCAP 278* 138* 326*    Condition -   Guarded  Family Communication  : None  Code Status :  Full  Diet :    Diet Order            DIET SOFT Room service appropriate? Yes; Fluid consistency: Thin  Diet effective now               Disposition Plan  :  Inpt  Consults  :    Procedures  :    TTE 02/09/19 -   1. Left ventricular ejection fraction, by visual estimation, is 55 to 60%. The left ventricle has normal function. Normal left ventricular size. There is mild left ventricular  hypertrophy.  2. Global right ventricle has normal systolic function.The right ventricular size is normal. No increase in right ventricular wall thickness.  3. Left atrial size was normal.  4. Right atrial size was normal.  5. The mitral valve is normal in structure. No evidence of mitral valve regurgitation.  6. The tricuspid valve is grossly normal. Tricuspid valve regurgitation is trivial.  7. The aortic valve is normal in structure. Aortic valve regurgitation was not visualized by color flow Doppler.  8. The pulmonic valve was not well visualized. Pulmonic valve regurgitation is not visualized by color flow Doppler.  9. TR signal is inadequate for assessing pulmonary artery systolic pressure. 10. The inferior vena cava is dilated in size with >50% respiratory variability, suggesting right atrial pressure of 8 mmHg.  CTA - 1. No pulmonary embolus. 2. Multifocal peripheral and basilar predominant ground-glass opacities in both lungs, pattern consistent with COVID-19 pneumonia. 3. Mild central bronchial thickening with retained mucus in the trachea and mainstem bronchi. 4. Questionable mild pulmonary edema at the bases septal thickening. 5. Mildly enlarged bilateral hilar lymph nodes are likely reactive, but nonspecific. Adenopathy in the setting of COVID-19 is unusual, and may suggest superimposed bacterial infection. Aortic Atherosclerosis (ICD10-I70.0).  PUD Prophylaxis :  PPI  DVT Prophylaxis  :  Eliquis  Lab Results  Component Value Date   PLT 201 02/11/2019    Inpatient Medications  Scheduled Meds: . apixaban  5 mg Oral BID  . atorvastatin  40 mg Oral q1800  . benztropine  1 mg Oral QHS  . chlorhexidine  15 mL Mouth Rinse BID  . Chlorhexidine Gluconate Cloth  6 each Topical Daily  . citalopram  20 mg Oral Daily  . dexamethasone (DECADRON) injection  6 mg Intravenous Q24H  . diltiazem  120 mg Oral Once  . [START ON 02/12/2019] diltiazem  240 mg Oral Daily  . insulin aspart   0-15 Units Subcutaneous TID WC  . insulin aspart  0-5 Units Subcutaneous QHS  . insulin aspart  5 Units Subcutaneous TID WC  . insulin glargine  8 Units Subcutaneous BID  . losartan  100 mg Oral Daily  . mouth rinse  15 mL Mouth Rinse q12n4p  . multivitamin with minerals  1 tablet Oral Daily  . mupirocin ointment  1 application Topical QPM  . pantoprazole  40 mg Oral Daily  . polyethylene glycol  17 g Oral Daily  . QUEtiapine  100 mg Oral QHS  . QUEtiapine  50 mg Oral Daily  . risperiDONE  1 mg Oral QHS  . senna  2 tablet Oral Daily  . vitamin B-12  2,000 mcg Oral Daily   Continuous Infusions: . remdesivir 100 mg in NS 250 mL Stopped (02/10/19 1243)   PRN Meds:.bisacodyl, haloperidol lactate  Antibiotics  :    Anti-infectives (From admission, onward)   Start     Dose/Rate Route Frequency Ordered Stop   02/10/19 1200  remdesivir 100 mg in sodium chloride 0.9 % 250 mL IVPB     100 mg 500 mL/hr over 30 Minutes Intravenous Every 24 hours 02/09/19 0820 02/14/19 1159   02/09/19 1200  remdesivir 200 mg in sodium chloride 0.9 % 250 mL IVPB     200 mg 500 mL/hr over 30 Minutes Intravenous Once 02/09/19 0820 02/09/19 1242   02/09/19 0145  azithromycin (ZITHROMAX) 500 mg in sodium chloride 0.9 % 250 mL IVPB  Status:  Discontinued     500 mg 250 mL/hr over 60 Minutes Intravenous Every 24 hours 02/09/19 0133 02/09/19 1628       Time Spent in minutes  30   Lala Lund M.D on 02/11/2019 at 11:33 AM  To page go to [email or web address] - password [facility]  [facility] -  Office  [phone]   See all Orders from today for further details    Objective:   Vitals:   02/10/19 2300 02/10/19 2314 02/11/19 0343 02/11/19 0723  BP:  (!) 149/85 106/62 133/73  Pulse: 89 92 (!) 101 82  Resp: 13  (!) 23 (!) 21  Temp:   98.3 F (36.8 C) 97.8 F (36.6 C)  TempSrc:   Oral Oral  SpO2: 93% 95% 95% 94%  Weight:      Height:        Wt Readings from Last 3 Encounters:  02/09/19 86.7 kg   08/06/13 85.3 kg  07/25/13 86.1 kg     Intake/Output Summary (Last 24 hours) at 02/11/2019 1133 Last data filed at 02/11/2019 0655 Gross per 24 hour  Intake 625.64 ml  Output 1390 ml  Net -764.36 ml     Physical Exam  Awake but confused, No new F.N deficits,   [place].AT,PERRAL Supple Neck,No JVD, No cervical lymphadenopathy appriciated.  Symmetrical Chest wall movement, Good air movement bilaterally, CTAB iRRR,No Gallops,Rubs or new Murmurs,  No Parasternal Heave +ve B.Sounds, Abd Soft, No tenderness, No organomegaly appriciated, No rebound - guarding or rigidity. No Cyanosis, Clubbing or edema, No new Rash or bruise      Data Review:    CBC Recent Labs  Lab 02/08/19 2053 02/09/19 0059 02/10/19 0552 02/11/19 0255  WBC 4.5 4.1 7.0 6.7  HGB 14.0 13.9 13.8 13.0  HCT 43.4 42.8 40.2 40.0  PLT 145* 131* 155 201  MCV 88.2 88.6 86.3 88.7  MCH 28.5 28.8 29.6 28.8  MCHC 32.3 32.5 34.3 32.5  RDW 13.6 13.5 13.3 13.5  LYMPHSABS 0.8 0.4* 0.5* 0.5*  MONOABS 0.6 0.1 0.7 0.8  EOSABS 0.0 0.0 0.0 0.0  BASOSABS 0.0 0.0 0.0 0.0    Chemistries  Recent Labs  Lab 02/08/19 2053 02/09/19 0059 02/10/19 0552 02/11/19 0255  NA 133* 130* 135 137  K 3.9 4.4 4.7 4.2  CL 99 99 102 104  CO2 22 19* 18* 21*  GLUCOSE 139* 191* 270* 153*  BUN 12 12 24* 26*  CREATININE 1.07 0.96 1.19 0.92  CALCIUM 8.4* 8.2* 8.4* 8.4*  MG  --   --   --  2.7*  AST 36 34  --  18  ALT 25 25  --  16  ALKPHOS 60 59  --  49  BILITOT 0.8 1.3*  --  0.7   ------------------------------------------------------------------------------------------------------------------ No results for input(s): CHOL, HDL, LDLCALC, TRIG, CHOLHDL, LDLDIRECT in the last 72 hours.  Lab Results  Component Value Date   HGBA1C 8.6 (H) 02/09/2019   ------------------------------------------------------------------------------------------------------------------ Recent Labs    02/09/19 1710 02/10/19 1105  TSH 0.299*  --   T3FREE  --   2.3    Cardiac Enzymes No results for input(s): CKMB, TROPONINI, MYOGLOBIN in the last 168 hours.  Invalid input(s): CK ------------------------------------------------------------------------------------------------------------------    Component Value Date/Time   BNP 49.5 02/11/2019 0255    Micro Results Recent Results (from the past 240 hour(s))  Novel Coronavirus, NAA ([facility])     Status: Abnormal   Collection Time: 02/06/19 12:00 AM   Specimen: Oropharyngeal(OP) collection in vial transport medium   OROPHARYNGEA  TESTING  Result Value Ref Range Status   SARS-CoV-2, NAA Detected (A) Not Detected Final    Comment: Testing was performed using the cobas(R) SARS-CoV-2 test. This nucleic acid amplification test was developed and its performance characteristics determined by [employer]. Nucleic acid amplification tests include PCR and TMA. This test has not been FDA cleared or approved. This test has been authorized by FDA under an Emergency Use Authorization (EUA). This test is only authorized for the duration of time the declaration that circumstances exist justifying the authorization of the emergency use of in vitro diagnostic tests for detection of SARS-CoV-2 virus and/or diagnosis of COVID-19 infection under section 564(b)(1) of the Act, 21 U.S.C. 315QMG-8(Q) (1), unless the authorization is terminated or revoked sooner. When diagnostic testing is negative, the possibility of a false negative result should be considered in the context of a patient's recent exposures and the presence of clinical signs and symptoms consistent with COVID-19. An individual without symptoms  of COVID-19 and who is not shedding SARS-CoV-2 virus would expect to have a negative (not detected) result in this assay.   Culture, blood (Routine X 2) w Reflex to ID Panel     Status: None (Preliminary result)   Collection Time: 02/08/19  8:27 PM   Specimen: Right Antecubital; Blood   Result Value Ref Range Status   Specimen Description RIGHT ANTECUBITAL  Final   Special Requests   Final    BOTTLES DRAWN AEROBIC AND ANAEROBIC Blood Culture adequate volume   Culture   Final    NO GROWTH 3 DAYS Performed at [facility], [address] [place], [place] [address]    Report Status PENDING  Incomplete  Culture, blood (Routine X 2) w Reflex to ID Panel     Status: None (Preliminary result)   Collection Time: 02/09/19 12:59 AM   Specimen: BLOOD RIGHT FOREARM  Result Value Ref Range Status   Specimen Description BLOOD RIGHT FOREARM  Final   Special Requests   Final    BOTTLES DRAWN AEROBIC AND ANAEROBIC Blood Culture adequate volume   Culture   Final    NO GROWTH 2 DAYS Performed at [facility], [address] [place], [place] [address]    Report Status PENDING  Incomplete  MRSA PCR Screening     Status: None   Collection Time: 02/09/19  4:04 AM   Specimen: Nasopharyngeal  Result Value Ref Range Status   MRSA by PCR NEGATIVE NEGATIVE Final    Comment:        The GeneXpert MRSA Assay (FDA approved for NASAL specimens only), is one component of a comprehensive MRSA colonization surveillance program. It is not intended to diagnose MRSA infection nor to guide or monitor treatment for MRSA infections. Performed at [facility], [place] [address] [place], [place] [address]     Radiology Reports Ct Angio Chest Pe W Or Wo Contrast  Result Date: 02/08/2019 CLINICAL DATA:  Shortness of breath. Atrial fibrillation with RVR. COVID-19 positive. EXAM: CT ANGIOGRAPHY CHEST WITH CONTRAST TECHNIQUE: Multidetector CT imaging of the chest was performed using the standard protocol during bolus administration of intravenous contrast. Multiplanar CT image reconstructions and MIPs were obtained to evaluate the vascular anatomy. CONTRAST:  94m OMNIPAQUE IOHEXOL 350 MG/ML SOLN COMPARISON:  Radiograph earlier this day. FINDINGS: Cardiovascular: There are no filling defects within  the pulmonary arteries to suggest pulmonary embolus. The thoracic aorta is normal in caliber. No aortic dissection. Conventional branching pattern from the aortic arch. The heart is normal in size. No pericardial effusion. Few coronary artery calcifications. Mediastinum/Nodes: Increased number of mildly enlarged bilateral hilar lymph nodes, 11 mm greatest dimension. No mediastinal adenopathy. No visualized thyroid nodule. Esophagus slightly patulous with small hiatal hernia. Lungs/Pleura: Multifocal bilateral ground-glass opacities in the peripheral and basilar predominant distribution. Mild biapical pleuroparenchymal scarring. There is retained mucus in the dependent trachea and mainstem bronchi. Mild central bronchial thickening. Questionable mild pulmonary edema septal thickening at the bases. No confluent airspace disease. No pleural fluid. Upper Abdomen: No acute finding. Musculoskeletal: There are no acute or suspicious osseous abnormalities. Review of the MIP images confirms the above findings. IMPRESSION: 1. No pulmonary embolus. 2. Multifocal peripheral and basilar predominant ground-glass opacities in both lungs, pattern consistent with COVID-19 pneumonia. 3. Mild central bronchial thickening with retained mucus in the trachea and mainstem bronchi. 4. Questionable mild pulmonary edema at the bases septal thickening. 5. Mildly enlarged bilateral hilar lymph nodes are likely reactive, but nonspecific. Adenopathy in the setting of COVID-19 is unusual, and may suggest superimposed bacterial infection. Aortic Atherosclerosis (ICD10-I70.0). Electronically Signed   By: MKeith RakeM.D.   On: 02/08/2019 23:32   Dg Chest Portable 1 View  Result Date: 02/08/2019 CLINICAL DATA:  Shortness of breath EXAM: PORTABLE CHEST 1 VIEW COMPARISON:  None. FINDINGS: No focal opacity or pleural effusion. Borderline cardiomegaly with central vascular congestion. No  pneumothorax. IMPRESSION: Borderline cardiomegaly with  central vascular congestion Electronically Signed   By: Donavan Foil M.D.   On: 02/08/2019 21:14

## 2019-02-11 NOTE — Progress Notes (Signed)
Pt resting in bed, NADN,  VSS, Bad alarm on, will keep monitoring pt for changes

## 2019-02-11 NOTE — Evaluation (Signed)
Physical Therapy Evaluation Patient Details Name: Darrell Lang MRN: [id number] DOB: [birth year] Today's Date: 02/11/2019   History of Present Illness  73 year old male with PMH of HTN, HLD, DM 2, anxiety and depression presented with complaints of fever, dry cough and dyspnea.  Now diagnosed with COVID-19 pneumonia, hypoxia, new onset A. fib w/ RVR. Transferring to [place] 9/28.  Clinical Impression   Pt admitted with COVID dx, spouse is also in hospital. Pt seems to be processing things at slower rate than normal and takes increased time to complete most tasks given. He is able to ambulate upwards of 3102ft with HHA and 1 seated rest break, takes very slow and small steps. Pt states he and spouse may be d/c home today, spouse reports daughter will be home with both at d/c.    Follow Up Recommendations No PT follow up    Equipment Recommendations  None recommended by PT    Recommendations for Other Services       Precautions / Restrictions Precautions Precautions: Fall(a-fib) Restrictions Weight Bearing Restrictions: No      Mobility  Bed Mobility Overal bed mobility: Modified Independent                Transfers Overall transfer level: Modified independent Equipment used: 1 person hand held assist                Ambulation/Gait Ambulation/Gait assistance: Min guard Gait Distance (Feet): 300 Feet Assistive device: 1 person hand held assist Gait Pattern/deviations: Step-through pattern(very small steps)        Stairs            Wheelchair Mobility    Modified Rankin (Stroke Patients Only)       Balance Overall balance assessment: Needs assistance Sitting-balance support: Feet supported Sitting balance-Leahy Scale: Good     Standing balance support: Single extremity supported Standing balance-Leahy Scale: Fair                               Pertinent Vitals/Pain Pain Assessment: No/denies pain    Home Living  Family/patient expects to be discharged to:: Private residence Living Arrangements: Spouse/significant other Available Help at Discharge: Family Type of Home: House Home Access: Stairs to enter Entrance Stairs-Rails: Can reach both Entrance Stairs-Number of Steps: 2 Home Layout: One level Home Equipment: None      Prior Function Level of Independence: Independent               Hand Dominance        Extremity/Trunk Assessment   Upper Extremity Assessment Upper Extremity Assessment: Overall WFL for tasks assessed    Lower Extremity Assessment Lower Extremity Assessment: Overall WFL for tasks assessed    Cervical / Trunk Assessment Cervical / Trunk Assessment: Normal  Communication   Communication: No difficulties  Cognition Arousal/Alertness: Awake/alert Behavior During Therapy: Restless Overall Cognitive Status: Impaired/Different from baseline                                 General Comments: Pt has sitter in room      General Comments General comments (skin integrity, edema, etc.): Pt does well with mobility, seems to be processing very slowly, takes increased time to complete most tasks and also answer questions. Spouse is also in hospital.    Exercises     Assessment/Plan    PT Assessment  Patient needs continued PT services  PT Problem List Decreased activity tolerance;Decreased coordination;Decreased strength       PT Treatment Interventions Gait training;Therapeutic activities;Therapeutic exercise;Functional mobility training;Balance training;Neuromuscular re-education;Patient/family education    PT Goals (Current goals can be found in the Care Plan section)  Acute Rehab PT Goals Patient Stated Goal: go home with spouse PT Goal Formulation: With patient/family Time For Goal Achievement: 02/25/19 Potential to Achieve Goals: Good    Frequency Min 3X/week   Barriers to discharge   spouse is also hospitalized at this time     Co-evaluation               AM-PAC PT "6 Clicks" Mobility  Outcome Measure Help needed turning from your back to your side while in a flat bed without using bedrails?: None Help needed moving from lying on your back to sitting on the side of a flat bed without using bedrails?: A Little Help needed moving to and from a bed to a chair (including a wheelchair)?: A Little Help needed standing up from a chair using your arms (e.g., wheelchair or bedside chair)?: A Little Help needed to walk in hospital room?: A Little Help needed climbing 3-5 steps with a railing? : A Little 6 Click Score: 19    End of Session   Activity Tolerance: Treatment limited secondary to medical complications (Comment) Patient left: in chair;with nursing/sitter in room Nurse Communication: Mobility status;Precautions PT Visit Diagnosis: Other abnormalities of gait and mobility (R26.89);Muscle weakness (generalized) (M62.81)    Time: [id number] PT Time Calculation (min) (ACUTE ONLY): 35 min   Charges:   PT Evaluation $PT Eval Moderate Complexity: 1 Mod PT Treatments $Gait Training: 23-37 mins        Horald Chestnut, PT   Delford Field 02/11/2019, 10:15 AM

## 2019-02-12 LAB — CBC WITH DIFFERENTIAL/PLATELET
Abs Immature Granulocytes: 0.05 10*3/uL (ref 0.00–0.07)
Basophils Absolute: 0 10*3/uL (ref 0.0–0.1)
Basophils Relative: 0 %
Eosinophils Absolute: 0 10*3/uL (ref 0.0–0.5)
Eosinophils Relative: 0 %
HCT: 41.8 % (ref 39.0–52.0)
Hemoglobin: 13.7 g/dL (ref 13.0–17.0)
Immature Granulocytes: 1 %
Lymphocytes Relative: 7 %
Lymphs Abs: 0.4 10*3/uL — ABNORMAL LOW (ref 0.7–4.0)
MCH: 29 pg (ref 26.0–34.0)
MCHC: 32.8 g/dL (ref 30.0–36.0)
MCV: 88.4 fL (ref 80.0–100.0)
Monocytes Absolute: 0.6 10*3/uL (ref 0.1–1.0)
Monocytes Relative: 10 %
Neutro Abs: 4.6 10*3/uL (ref 1.7–7.7)
Neutrophils Relative %: 82 %
Platelets: 197 10*3/uL (ref 150–400)
RBC: 4.73 MIL/uL (ref 4.22–5.81)
RDW: 13.4 % (ref 11.5–15.5)
WBC: 5.7 10*3/uL (ref 4.0–10.5)
nRBC: 0 % (ref 0.0–0.2)

## 2019-02-12 LAB — GLUCOSE, CAPILLARY
Glucose-Capillary: 247 mg/dL — ABNORMAL HIGH (ref 70–99)
Glucose-Capillary: 450 mg/dL — ABNORMAL HIGH (ref 70–99)
Glucose-Capillary: 410 mg/dL — ABNORMAL HIGH (ref 70–99)
Glucose-Capillary: 358 mg/dL — ABNORMAL HIGH (ref 70–99)
Glucose-Capillary: 251 mg/dL — ABNORMAL HIGH (ref 70–99)

## 2019-02-12 LAB — COMPREHENSIVE METABOLIC PANEL
ALT: 16 U/L (ref 0–44)
AST: 16 U/L (ref 15–41)
Albumin: 3.5 g/dL (ref 3.5–5.0)
Alkaline Phosphatase: 53 U/L (ref 38–126)
Anion gap: 7 (ref 5–15)
BUN: 23 mg/dL (ref 8–23)
CO2: 24 mmol/L (ref 22–32)
Calcium: 8.4 mg/dL — ABNORMAL LOW (ref 8.9–10.3)
Chloride: 108 mmol/L (ref 98–111)
Creatinine, Ser: 0.78 mg/dL (ref 0.61–1.24)
GFR calc Af Amer: >60 mL/min (ref >60)
GFR calc non Af Amer: >60 mL/min (ref >60)
Glucose, Bld: 229 mg/dL — ABNORMAL HIGH (ref 70–99)
Potassium: 4.2 mmol/L (ref 3.5–5.1)
Sodium: 139 mmol/L (ref 135–145)
Total Bilirubin: 0.5 mg/dL (ref 0.3–1.2)
Total Protein: 6.5 g/dL (ref 6.5–8.1)

## 2019-02-12 LAB — LACTATE DEHYDROGENASE: LDH: 201 U/L — ABNORMAL HIGH (ref 98–192)

## 2019-02-12 LAB — T3: T3, Total: 84 ng/dL (ref 71–180)

## 2019-02-12 LAB — BRAIN NATRIURETIC PEPTIDE: B Natriuretic Peptide: 45.6 pg/mL (ref 0.0–100.0)

## 2019-02-12 LAB — T4, FREE: Free T4: 1.71 ng/dL — ABNORMAL HIGH (ref 0.61–1.12)

## 2019-02-12 LAB — FERRITIN: Ferritin: 226 ng/mL (ref 24–336)

## 2019-02-12 LAB — MAGNESIUM: Magnesium: 2.9 mg/dL — ABNORMAL HIGH (ref 1.7–2.4)

## 2019-02-12 LAB — ABO/RH: ABO/RH(D): O POS

## 2019-02-12 LAB — C-REACTIVE PROTEIN: CRP: 1.6 mg/dL — ABNORMAL HIGH (ref <1.0)

## 2019-02-12 LAB — D-DIMER, QUANTITATIVE: D-Dimer, Quant: <0.27 ug/mL-FEU (ref 0.00–0.50)

## 2019-02-12 MED ORDER — INSULIN GLARGINE 100 UNIT/ML ~~LOC~~ SOLN
10.0000 [IU] | Freq: Once | SUBCUTANEOUS | Status: AC
  Administered 2019-02-12: 10 [IU] via SUBCUTANEOUS
  Filled 2019-02-12: qty 0.1

## 2019-02-12 MED ORDER — TAMSULOSIN HCL 0.4 MG PO CAPS
0.4000 mg | ORAL_CAPSULE | Freq: Every day | ORAL | Status: DC
  Administered 2019-02-12 – 2019-02-13 (×2): 0.4 mg via ORAL
  Filled 2019-02-12 (×2): qty 1

## 2019-02-12 MED ORDER — INSULIN ASPART 100 UNIT/ML ~~LOC~~ SOLN
30.0000 [IU] | Freq: Once | SUBCUTANEOUS | Status: AC
  Administered 2019-02-12: 30 [IU] via SUBCUTANEOUS

## 2019-02-12 MED ORDER — DEXAMETHASONE SODIUM PHOSPHATE 4 MG/ML IJ SOLN: 4.0000 mg | INTRAMUSCULAR | Status: DC

## 2019-02-12 MED ORDER — DILTIAZEM HCL ER COATED BEADS 180 MG PO CP24
300.0000 mg | ORAL_CAPSULE | Freq: Every day | ORAL | Status: DC
  Administered 2019-02-13: 09:00:00 300 mg via ORAL
  Filled 2019-02-12: qty 1

## 2019-02-12 MED ORDER — DEXAMETHASONE SODIUM PHOSPHATE 4 MG/ML IJ SOLN
2.0000 mg | INTRAMUSCULAR | Status: DC
  Filled 2019-02-12: qty 1

## 2019-02-12 MED ORDER — DILTIAZEM HCL ER COATED BEADS 120 MG PO CP24
120.0000 mg | ORAL_CAPSULE | Freq: Once | ORAL | Status: AC
  Administered 2019-02-12: 120 mg via ORAL
  Filled 2019-02-12: qty 1

## 2019-02-12 MED ORDER — PNEUMOCOCCAL VAC POLYVALENT 25 MCG/0.5ML IJ INJ
0.5000 mL | INJECTION | INTRAMUSCULAR | Status: AC
  Administered 2019-02-13: 09:00:00 0.5 mL via INTRAMUSCULAR
  Filled 2019-02-12: qty 0.5

## 2019-02-12 MED ORDER — INFLUENZA VAC A&B SA ADJ QUAD 0.5 ML IM PRSY
0.5000 mL | PREFILLED_SYRINGE | INTRAMUSCULAR | Status: AC
  Administered 2019-02-13: 0.5 mL via INTRAMUSCULAR
  Filled 2019-02-12: qty 0.5

## 2019-02-12 NOTE — Progress Notes (Signed)
CRITICAL VALUE ALERT  Critical Value:  POC blood glucose 410  Date & Time Notied:  02/12/2019 & 17:10  Provider Notified: Dr. Candiss Norse  Orders Received/Actions taken: See orders-- Administer Novolog 35 units subq, now, no further insulin tonight.  Have night nurse check a CBG at 11pm and cover only if CBG >200

## 2019-02-12 NOTE — Progress Notes (Signed)
PROGRESS NOTE                                                                                                                                                                                                             Patient Demographics:    Darrell Lang, is a 73 y.o. male, DOB - 01-02-1946, MPN:[id number]  Outpatient Primary MD for the patient is System, Pcp Not In    LOS - 4  Admit date - 02/08/2019    Chief Complaint  Patient presents with   Shortness of Breath    COVID positive       Brief Narrative - 73 year old male with PMH of HTN, HLD, DM 2, anxiety and depression presented with complaints of fever, dry cough and dyspnea.  Now diagnosed with COVID-19 pneumonia, hypoxia, new onset A. fib.  Started on IV Cardizem and heparin drip.  Rate controlled.  Transitioned to oral Cardizem and consolidated to Cardizem CD.  Initiated Eliquis.  Transferring to [place] 9/28.   Subjective:   Patient mildly confused but comfortable in bed denies any headache chest pain or shortness of breath, appears to be not in any distress.   Assessment  & Plan :     1. Acute Hypoxic Resp. Failure due to Acute Covid 19 Viral Pneumonitis during the ongoing 2020 Covid 19 Pandemic - he has been treated with steroids along with IV Remdisvir starting on 02/09/2019.  Seems to have responded the treatment well, inflammatory markers are stable and he is currently stable on room air.  Continue supportive care.   [place]    02/10/19 1105 02/11/19 0255 02/12/19 0400  DDIMER  --  0.38 <0.27  FERRITIN 418* 326 226  LDH 229* 222* 201*  CRP 5.2* 3.1* 1.6*    Lab Results  Component Value Date   SARSCOV2NAA Detected (A) 02/06/2019     Hepatic Function Latest Ref Rng & Units 02/12/2019 02/11/2019 02/09/2019  Total Protein 6.5 - 8.1 g/dL 6.5 6.6 7.5  Albumin 3.5 - 5.0 g/dL 3.5 3.5 4.0  AST 15 - 41 U/L 16 18 34   ALT 0 - 44 U/L '16 16 25  '$ Alk Phosphatase 38 - 126 U/L 53 49 59  Total Bilirubin 0.3 - 1.2 mg/dL 0.5 0.7 1.3(H)        Component Value  Date/Time   BNP 45.6 02/12/2019 0400      2.  New onset A. fib with RVR [place] vas 2 score of at least 3.  Echocardiogram noted with preserved EF, currently on Cardizem for rate control, dose adjusted again on 02/12/2019 for better control, Eliquis for anticoagulation.  Noted his TSH to be slightly low.  Will repeat TSH along with free T4 and T3.  3.  Essential hypertension.  Currently on combination of ARB and Cardizem.  Blood pressure still high will go up on Cardizem further on 02/12/2019.  4.  Dyslipidemia.  Stable on statin.  5.  Mildly low TSH.  Could be sick euthyroid versus true hyperthyroidism, repeat TSH remains suppressed awaiting new free T4 and T3 results.   6.  GERD.  On PPI continue.  8.  Severe toxic encephalopathy/hospital-acquired delirium.  Placed on Seroquel, dose adjusted, PRN Haldol added.  Minimize narcotics and benzodiazepines.  Sitter for now.  9.  Anxiety and depression.  Continue Celexa.  10.  DM type II.  Poor outpatient control due to hyperglycemia A1c was 8.6.  Currently on combination of Lantus and sliding scale.  Lantus dose increased and pre-meal NovoLog added on 02/11/2019 for better control.  Lab Results  Component Value Date   HGBA1C 8.6 (H) 02/09/2019   CBG (last 3)  Recent Labs    02/11/19 1702 02/11/19 2055 02/12/19 0747  GLUCAP 192* 179* 247*    Condition -   Guarded  Family Communication  : None  Code Status :  Full  Diet :    Diet Order            DIET SOFT Room service appropriate? Yes; Fluid consistency: Thin  Diet effective now               Disposition Plan  :  Inpt  Consults  :    Procedures  :    TTE 02/09/19 -   1. Left ventricular ejection fraction, by visual estimation, is 55 to 60%. The left ventricle has normal function. Normal left ventricular size. There is mild left  ventricular hypertrophy.  2. Global right ventricle has normal systolic function.The right ventricular size is normal. No increase in right ventricular wall thickness.  3. Left atrial size was normal.  4. Right atrial size was normal.  5. The mitral valve is normal in structure. No evidence of mitral valve regurgitation.  6. The tricuspid valve is grossly normal. Tricuspid valve regurgitation is trivial.  7. The aortic valve is normal in structure. Aortic valve regurgitation was not visualized by color flow Doppler.  8. The pulmonic valve was not well visualized. Pulmonic valve regurgitation is not visualized by color flow Doppler.  9. TR signal is inadequate for assessing pulmonary artery systolic pressure. 10. The inferior vena cava is dilated in size with >50% respiratory variability, suggesting right atrial pressure of 8 mmHg.  CTA - 1. No pulmonary embolus. 2. Multifocal peripheral and basilar predominant ground-glass opacities in both lungs, pattern consistent with COVID-19 pneumonia. 3. Mild central bronchial thickening with retained mucus in the trachea and mainstem bronchi. 4. Questionable mild pulmonary edema at the bases septal thickening. 5. Mildly enlarged bilateral hilar lymph nodes are likely reactive, but nonspecific. Adenopathy in the setting of COVID-19 is unusual, and may suggest superimposed bacterial infection. Aortic Atherosclerosis (ICD10-I70.0).  PUD Prophylaxis :  PPI  DVT Prophylaxis  :  Eliquis  Lab Results  Component Value Date   PLT 197 02/12/2019  Inpatient Medications  Scheduled Meds:  apixaban  5 mg Oral BID   atorvastatin  40 mg Oral q1800   benztropine  1 mg Oral QHS   chlorhexidine  15 mL Mouth Rinse BID   citalopram  20 mg Oral Daily   [START ON 02/13/2019] dexamethasone (DECADRON) injection  4 mg Intravenous Q24H   diltiazem  240 mg Oral Daily   [START ON 02/13/2019] influenza vaccine adjuvanted  0.5 mL Intramuscular Tomorrow-1000    insulin aspart  0-20 Units Subcutaneous TID WC   insulin aspart  0-5 Units Subcutaneous QHS   insulin aspart  5 Units Subcutaneous TID WC   insulin glargine  30 Units Subcutaneous Daily   losartan  100 mg Oral Daily   mouth rinse  15 mL Mouth Rinse q12n4p   multivitamin with minerals  1 tablet Oral Daily   mupirocin ointment  1 application Topical QPM   pantoprazole  40 mg Oral Daily   [START ON 02/13/2019] pneumococcal 23 valent vaccine  0.5 mL Intramuscular Tomorrow-1000   polyethylene glycol  17 g Oral Daily   QUEtiapine  100 mg Oral QHS   QUEtiapine  50 mg Oral Daily   risperiDONE  1 mg Oral QHS   senna  2 tablet Oral Daily   vitamin B-12  2,000 mcg Oral Daily   Continuous Infusions:  remdesivir 100 mg in NS 250 mL Stopped (02/12/19 1115)   PRN Meds:.bisacodyl, haloperidol lactate  Antibiotics  :    Anti-infectives (From admission, onward)   Start     Dose/Rate Route Frequency Ordered Stop   02/10/19 1200  remdesivir 100 mg in sodium chloride 0.9 % 250 mL IVPB     100 mg 500 mL/hr over 30 Minutes Intravenous Every 24 hours 02/09/19 0820 02/14/19 0959   02/09/19 1200  remdesivir 200 mg in sodium chloride 0.9 % 250 mL IVPB     200 mg 500 mL/hr over 30 Minutes Intravenous Once 02/09/19 0820 02/09/19 1242   02/09/19 0145  azithromycin (ZITHROMAX) 500 mg in sodium chloride 0.9 % 250 mL IVPB  Status:  Discontinued     500 mg 250 mL/hr over 60 Minutes Intravenous Every 24 hours 02/09/19 0133 02/09/19 1628       Time Spent in minutes  30   Lala Lund M.D on 02/12/2019 at 11:58 AM  To page go to [email or web address] - password [facility]  [facility] -  Office  [phone]   See all Orders from today for further details    Objective:   Vitals:   02/11/19 1545 02/11/19 1952 02/12/19 0442 02/12/19 0745  BP: (!) 141/68 129/79 (!) 148/75 (!) 149/82  Pulse: 65 98  (!) 102  Resp: 16 (!) 24  20  Temp: 97.6 F (36.4 C) 99.2 F (37.3 C) 97.7 F (36.5 C) 97.9  F (36.6 C)  TempSrc:  Oral  Oral  SpO2: 95% 96%  94%  Weight:      Height:        Wt Readings from Last 3 Encounters:  02/09/19 86.7 kg  08/06/13 85.3 kg  07/25/13 86.1 kg     Intake/Output Summary (Last 24 hours) at 02/12/2019 1158 Last data filed at 02/12/2019 1115 Gross per 24 hour  Intake 1587.97 ml  Output 400 ml  Net 1187.97 ml     Physical Exam  Awake but remains confused, no focal deficits [place].AT,PERRAL Supple Neck,No JVD, No cervical lymphadenopathy appriciated.  Symmetrical Chest wall movement, Good air movement bilaterally, CTAB iRRR,No Gallops,  Rubs or new Murmurs, No Parasternal Heave +ve B.Sounds, Abd Soft, No tenderness, No organomegaly appriciated, No rebound - guarding or rigidity. No Cyanosis, Clubbing or edema, No new Rash or bruise     Data Review:    CBC Recent Labs  Lab 02/08/19 2053 02/09/19 0059 02/10/19 0552 02/11/19 0255 02/12/19 0400  WBC 4.5 4.1 7.0 6.7 5.7  HGB 14.0 13.9 13.8 13.0 13.7  HCT 43.4 42.8 40.2 40.0 41.8  PLT 145* 131* 155 201 197  MCV 88.2 88.6 86.3 88.7 88.4  MCH 28.5 28.8 29.6 28.8 29.0  MCHC 32.3 32.5 34.3 32.5 32.8  RDW 13.6 13.5 13.3 13.5 13.4  LYMPHSABS 0.8 0.4* 0.5* 0.5* 0.4*  MONOABS 0.6 0.1 0.7 0.8 0.6  EOSABS 0.0 0.0 0.0 0.0 0.0  BASOSABS 0.0 0.0 0.0 0.0 0.0    Chemistries  Recent Labs  Lab 02/08/19 2053 02/09/19 0059 02/10/19 0552 02/11/19 0255 02/12/19 0400  NA 133* 130* 135 137 139  K 3.9 4.4 4.7 4.2 4.2  CL 99 99 102 104 108  CO2 22 19* 18* 21* 24  GLUCOSE 139* 191* 270* 153* 229*  BUN 12 12 24* 26* 23  CREATININE 1.07 0.96 1.19 0.92 0.78  CALCIUM 8.4* 8.2* 8.4* 8.4* 8.4*  MG  --   --   --  2.7* 2.9*  AST 36 34  --  18 16  ALT 25 25  --  16 16  ALKPHOS 60 59  --  49 53  BILITOT 0.8 1.3*  --  0.7 0.5   ------------------------------------------------------------------------------------------------------------------ No results for input(s): CHOL, HDL, LDLCALC, TRIG, CHOLHDL,  LDLDIRECT in the last 72 hours.  Lab Results  Component Value Date   HGBA1C 8.6 (H) 02/09/2019   ------------------------------------------------------------------------------------------------------------------ Recent Labs    02/10/19 1105 02/11/19 0255  TSH  --  0.207*  T3FREE 2.3  --     Cardiac Enzymes No results for input(s): CKMB, TROPONINI, MYOGLOBIN in the last 168 hours.  Invalid input(s): CK ------------------------------------------------------------------------------------------------------------------    Component Value Date/Time   BNP 45.6 02/12/2019 0400    Micro Results Recent Results (from the past 240 hour(s))  Novel Coronavirus, NAA ([facility])     Status: Abnormal   Collection Time: 02/06/19 12:00 AM   Specimen: Oropharyngeal(OP) collection in vial transport medium   OROPHARYNGEA  TESTING  Result Value Ref Range Status   SARS-CoV-2, NAA Detected (A) Not Detected Final    Comment: Testing was performed using the cobas(R) SARS-CoV-2 test. This nucleic acid amplification test was developed and its performance characteristics determined by [employer]. Nucleic acid amplification tests include PCR and TMA. This test has not been FDA cleared or approved. This test has been authorized by FDA under an Emergency Use Authorization (EUA). This test is only authorized for the duration of time the declaration that circumstances exist justifying the authorization of the emergency use of in vitro diagnostic tests for detection of SARS-CoV-2 virus and/or diagnosis of COVID-19 infection under section 564(b)(1) of the Act, 21 U.S.C. 588FOY-7(X) (1), unless the authorization is terminated or revoked sooner. When diagnostic testing is negative, the possibility of a false negative result should be considered in the context of a patient's recent exposures and the presence of clinical signs and symptoms consistent with COVID-19. An individual without symptoms  of  COVID-19 and who is not shedding SARS-CoV-2 virus would expect to have a negative (not detected) result in this assay.   Culture, blood (Routine X 2) w Reflex to ID Panel  Status: None (Preliminary result)   Collection Time: 02/08/19  8:27 PM   Specimen: Right Antecubital; Blood  Result Value Ref Range Status   Specimen Description RIGHT ANTECUBITAL  Final   Special Requests   Final    BOTTLES DRAWN AEROBIC AND ANAEROBIC Blood Culture results may not be optimal due to an excessive volume of blood received in culture bottles   Culture   Final    NO GROWTH 4 DAYS Performed at [facility], [address], [place], [place] [address]    Report Status PENDING  Incomplete  Culture, blood (Routine X 2) w Reflex to ID Panel     Status: None (Preliminary result)   Collection Time: 02/09/19 12:59 AM   Specimen: BLOOD RIGHT FOREARM  Result Value Ref Range Status   Specimen Description BLOOD RIGHT FOREARM  Final   Special Requests   Final    BOTTLES DRAWN AEROBIC AND ANAEROBIC Blood Culture adequate volume   Culture   Final    NO GROWTH 3 DAYS Performed at [facility], [address] [place], [place] [address]    Report Status PENDING  Incomplete  MRSA PCR Screening     Status: None   Collection Time: 02/09/19  4:04 AM   Specimen: Nasopharyngeal  Result Value Ref Range Status   MRSA by PCR NEGATIVE NEGATIVE Final    Comment:        The GeneXpert MRSA Assay (FDA approved for NASAL specimens only), is one component of a comprehensive MRSA colonization surveillance program. It is not intended to diagnose MRSA infection nor to guide or monitor treatment for MRSA infections. Performed at [facility], [place] [address], [place], [place] [address]     Radiology Reports Ct Angio Chest Pe W Or Wo Contrast  Result Date: 02/08/2019 CLINICAL DATA:  Shortness of breath. Atrial fibrillation with RVR. COVID-19 positive. EXAM: CT ANGIOGRAPHY CHEST WITH CONTRAST TECHNIQUE: Multidetector  CT imaging of the chest was performed using the standard protocol during bolus administration of intravenous contrast. Multiplanar CT image reconstructions and MIPs were obtained to evaluate the vascular anatomy. CONTRAST:  68m OMNIPAQUE IOHEXOL 350 MG/ML SOLN COMPARISON:  Radiograph earlier this day. FINDINGS: Cardiovascular: There are no filling defects within the pulmonary arteries to suggest pulmonary embolus. The thoracic aorta is normal in caliber. No aortic dissection. Conventional branching pattern from the aortic arch. The heart is normal in size. No pericardial effusion. Few coronary artery calcifications. Mediastinum/Nodes: Increased number of mildly enlarged bilateral hilar lymph nodes, 11 mm greatest dimension. No mediastinal adenopathy. No visualized thyroid nodule. Esophagus slightly patulous with small hiatal hernia. Lungs/Pleura: Multifocal bilateral ground-glass opacities in the peripheral and basilar predominant distribution. Mild biapical pleuroparenchymal scarring. There is retained mucus in the dependent trachea and mainstem bronchi. Mild central bronchial thickening. Questionable mild pulmonary edema septal thickening at the bases. No confluent airspace disease. No pleural fluid. Upper Abdomen: No acute finding. Musculoskeletal: There are no acute or suspicious osseous abnormalities. Review of the MIP images confirms the above findings. IMPRESSION: 1. No pulmonary embolus. 2. Multifocal peripheral and basilar predominant ground-glass opacities in both lungs, pattern consistent with COVID-19 pneumonia. 3. Mild central bronchial thickening with retained mucus in the trachea and mainstem bronchi. 4. Questionable mild pulmonary edema at the bases septal thickening. 5. Mildly enlarged bilateral hilar lymph nodes are likely reactive, but nonspecific. Adenopathy in the setting of COVID-19 is unusual, and may suggest superimposed bacterial infection. Aortic Atherosclerosis (ICD10-I70.0).  Electronically Signed   By: MThreasa Beards  [place] M.D.   On: 02/08/2019 23:32   Dg Chest Portable 1 View  Result Date: 02/08/2019 CLINICAL DATA:  Shortness of breath EXAM: PORTABLE CHEST 1 VIEW COMPARISON:  None. FINDINGS: No focal opacity or pleural effusion. Borderline cardiomegaly with central vascular congestion. No pneumothorax. IMPRESSION: Borderline cardiomegaly with central vascular congestion Electronically Signed   By: Donavan Foil M.D.   On: 02/08/2019 21:14

## 2019-02-12 NOTE — Progress Notes (Signed)
CRITICAL VALUE ALERT  Critical Value:  POC blood glucose 450  Date & Time Notied:  02/12/2019 & 12:20  Provider Notified: Dr. Candiss Norse  Orders Received/Actions taken: See orders.

## 2019-02-13 DIAGNOSIS — A4189 Other specified sepsis: Secondary | ICD-10-CM | POA: Diagnosis not present

## 2019-02-13 LAB — COMPREHENSIVE METABOLIC PANEL
ALT: 16 U/L (ref 0–44)
AST: 14 U/L — ABNORMAL LOW (ref 15–41)
Albumin: 3.3 g/dL — ABNORMAL LOW (ref 3.5–5.0)
Alkaline Phosphatase: 55 U/L (ref 38–126)
Anion gap: 8 (ref 5–15)
BUN: 23 mg/dL (ref 8–23)
CO2: 26 mmol/L (ref 22–32)
Calcium: 8.7 mg/dL — ABNORMAL LOW (ref 8.9–10.3)
Chloride: 108 mmol/L (ref 98–111)
Creatinine, Ser: 0.87 mg/dL (ref 0.61–1.24)
GFR calc Af Amer: >60 mL/min (ref >60)
GFR calc non Af Amer: >60 mL/min (ref >60)
Glucose, Bld: 98 mg/dL (ref 70–99)
Potassium: 3.9 mmol/L (ref 3.5–5.1)
Sodium: 142 mmol/L (ref 135–145)
Total Bilirubin: 0.4 mg/dL (ref 0.3–1.2)
Total Protein: 6.2 g/dL — ABNORMAL LOW (ref 6.5–8.1)

## 2019-02-13 LAB — BRAIN NATRIURETIC PEPTIDE: B Natriuretic Peptide: 215 pg/mL — ABNORMAL HIGH (ref 0.0–100.0)

## 2019-02-13 LAB — CBC WITH DIFFERENTIAL/PLATELET
Abs Immature Granulocytes: 0.07 10*3/uL (ref 0.00–0.07)
Basophils Absolute: 0 10*3/uL (ref 0.0–0.1)
Basophils Relative: 0 %
Eosinophils Absolute: 0 10*3/uL (ref 0.0–0.5)
Eosinophils Relative: 0 %
HCT: 40.7 % (ref 39.0–52.0)
Hemoglobin: 13.2 g/dL (ref 13.0–17.0)
Immature Granulocytes: 1 %
Lymphocytes Relative: 7 %
Lymphs Abs: 0.7 10*3/uL (ref 0.7–4.0)
MCH: 28.9 pg (ref 26.0–34.0)
MCHC: 32.4 g/dL (ref 30.0–36.0)
MCV: 89.1 fL (ref 80.0–100.0)
Monocytes Absolute: 1.2 10*3/uL — ABNORMAL HIGH (ref 0.1–1.0)
Monocytes Relative: 11 %
Neutro Abs: 9.1 10*3/uL — ABNORMAL HIGH (ref 1.7–7.7)
Neutrophils Relative %: 81 %
Platelets: 239 10*3/uL (ref 150–400)
RBC: 4.57 MIL/uL (ref 4.22–5.81)
RDW: 13.2 % (ref 11.5–15.5)
WBC: 11.1 10*3/uL — ABNORMAL HIGH (ref 4.0–10.5)
nRBC: 0 % (ref 0.0–0.2)

## 2019-02-13 LAB — C-REACTIVE PROTEIN: CRP: 0.9 mg/dL (ref <1.0)

## 2019-02-13 LAB — T3: T3, Total: 66 ng/dL — ABNORMAL LOW (ref 71–180)

## 2019-02-13 LAB — GLUCOSE, CAPILLARY
Glucose-Capillary: 139 mg/dL — ABNORMAL HIGH (ref 70–99)
Glucose-Capillary: 131 mg/dL — ABNORMAL HIGH (ref 70–99)
Glucose-Capillary: 228 mg/dL — ABNORMAL HIGH (ref 70–99)

## 2019-02-13 LAB — D-DIMER, QUANTITATIVE: D-Dimer, Quant: 0.33 ug/mL-FEU (ref 0.00–0.50)

## 2019-02-13 LAB — FERRITIN: Ferritin: 155 ng/mL (ref 24–336)

## 2019-02-13 LAB — CULTURE, BLOOD (ROUTINE X 2): Culture: NO GROWTH

## 2019-02-13 LAB — LACTATE DEHYDROGENASE: LDH: 186 U/L (ref 98–192)

## 2019-02-13 LAB — MAGNESIUM: Magnesium: 2.5 mg/dL — ABNORMAL HIGH (ref 1.7–2.4)

## 2019-02-13 MED ORDER — METHIMAZOLE 5 MG PO TABS: 5.0000 mg | ORAL_TABLET | Freq: Every day | ORAL | 0 refills | Status: DC

## 2019-02-13 MED ORDER — APIXABAN 5 MG PO TABS: 5.0000 mg | ORAL_TABLET | Freq: Two times a day (BID) | ORAL | 0 refills | Status: DC

## 2019-02-13 MED ORDER — DILTIAZEM HCL ER COATED BEADS 300 MG PO CP24: 300.0000 mg | ORAL_CAPSULE | Freq: Every day | ORAL | 0 refills | Status: DC

## 2019-02-13 MED ORDER — METHIMAZOLE 5 MG PO TABS
5.0000 mg | ORAL_TABLET | Freq: Every day | ORAL | Status: DC
  Administered 2019-02-13: 12:00:00 5 mg via ORAL
  Filled 2019-02-13 (×2): qty 1

## 2019-02-13 MED ORDER — TAMSULOSIN HCL 0.4 MG PO CAPS: 0.4000 mg | ORAL_CAPSULE | Freq: Every day | ORAL | 0 refills | Status: DC

## 2019-02-13 NOTE — TOC Transition Note (Signed)
Transition of Care [facility]) - CM/SW Discharge Note   Patient Details  Name: Darrell Lang MRN: [id number] Date of Birth: [birth year]  Transition [facility]) CM/SW Contact:  Ninfa Meeker, RN Phone Number:  [phone] (working remotely) 02/13/2019, 11:57 AM   Clinical Narrative:  73 yr old male admitted and treated for COVID 19. Thankfully patient is improving and will discharge home with Home Health.  Case manager spoke with patient's wife concerning discharge needs. She is also a patient at [facility]. Referral was called to Adela Lank, [facility] liaison. Alvis Lemmings will reach out to [facility] concerning orders. [facility] and needs.    Final next level of care: [place] Barriers to Discharge: No Barriers Identified   Patient Goals and CMS Choice Patient states their goals for this hospitalization and ongoing recovery are:: continue getting better   Choice offered to / list presented to : Patient  Discharge Placement                       Discharge Plan and Services   Discharge Planning Services: CM Consult Post Acute Care Choice: Home Health          DME Arranged: N/A DME Agency: NA       HH Arranged: RN, PT HH Agency: [place] Date [place]: 02/13/19 Time [place]: [id number] Representative spoke with at [place]: Adela Lank  Social Determinants of Health (SDOH) Interventions     Readmission Risk Interventions No flowsheet data found.

## 2019-02-13 NOTE — Discharge Instructions (Signed)
Follow with Primary MD in 7 days   Get CBC, CMP, TSH, free T4, 2 view Chest X ray -  checked next visit within 1 week by Primary MD    Activity: As tolerated with Full fall precautions use walker/cane & assistance as needed  Disposition Home   Diet: Soft, low carbohydrate heart Healthy with feeding assistance and aspiration precautions.     Special Instructions: If you have smoked or chewed Tobacco  in the last 2 yrs please stop smoking, stop any regular Alcohol  and or any Recreational drug use.  On your next visit with your primary care physician please Get Medicines reviewed and adjusted.  Please request your Prim.MD to go over all Hospital Tests and Procedure/Radiological results at the follow up, please get all Hospital records sent to your Prim MD by signing hospital release before you go home.  If you experience worsening of your admission symptoms, develop shortness of breath, life threatening emergency, suicidal or homicidal thoughts you must seek medical attention immediately by calling 911 or calling your MD immediately  if symptoms less severe.  You Must read complete instructions/literature along with all the possible adverse reactions/side effects for all the Medicines you take and that have been prescribed to you. Take any new Medicines after you have completely understood and accpet all the possible adverse reactions/side effects.       Person Under Monitoring Name: Darrell Lang  Location: [address] [place] [address]   Infection Prevention Recommendations for Individuals Confirmed to have, or Being Evaluated for, 2019 Novel Coronavirus (COVID-19) Infection Who Receive Care at Home  Individuals who are confirmed to have, or are being evaluated for, COVID-19 should follow the prevention steps below until a healthcare provider or local or state health department says they can return to normal activities.  Stay home except to get medical care You should  restrict activities outside your home, except for getting medical care. Do not go to work, school, or public areas, and do not use public transportation or taxis.  Call ahead before visiting your doctor Before your medical appointment, call the healthcare provider and tell them that you have, or are being evaluated for, COVID-19 infection. This will help the healthcare providers office take steps to keep other people from getting infected. Ask your healthcare provider to call the local or state health department.  Monitor your symptoms Seek prompt medical attention if your illness is worsening (e.g., difficulty breathing). Before going to your medical appointment, call the healthcare provider and tell them that you have, or are being evaluated for, COVID-19 infection. Ask your healthcare provider to call the local or state health department.  Wear a facemask You should wear a facemask that covers your nose and mouth when you are in the same room with other people and when you visit a healthcare provider. People who live with or visit you should also wear a facemask while they are in the same room with you.  Separate yourself from other people in your home As much as possible, you should stay in a different room from other people in your home. Also, you should use a separate bathroom, if available.  Avoid sharing household items You should not share dishes, drinking glasses, cups, eating utensils, towels, bedding, or other items with other people in your home. After using these items, you should wash them thoroughly with soap and water.  Cover your coughs and sneezes Cover your mouth and nose with a tissue  when you cough or sneeze, or you can cough or sneeze into your sleeve. Throw used tissues in a lined trash can, and immediately wash your hands with soap and water for at least 20 seconds or use an alcohol-based hand rub.  Wash your [facility] your hands often and thoroughly with  soap and water for at least 20 seconds. You can use an alcohol-based hand sanitizer if soap and water are not available and if your hands are not visibly dirty. Avoid touching your eyes, nose, and mouth with unwashed hands.   Prevention Steps for Caregivers and Household Members of Individuals Confirmed to have, or Being Evaluated for, COVID-19 Infection Being Cared for in the Home  If you live with, or provide care at home for, a person confirmed to have, or being evaluated for, COVID-19 infection please follow these guidelines to prevent infection:  Follow healthcare providers instructions Make sure that you understand and can help the patient follow any healthcare provider instructions for all care.  Provide for the patients basic needs You should help the patient with basic needs in the home and provide support for getting groceries, prescriptions, and other personal needs.  Monitor the patients symptoms If they are getting sicker, call his or her medical provider and tell them that the patient has, or is being evaluated for, COVID-19 infection. This will help the healthcare providers office take steps to keep other people from getting infected. Ask the healthcare provider to call the local or state health department.  Limit the number of people who have contact with the patient  If possible, have only one caregiver for the patient.  Other household members should stay in another home or place of residence. If this is not possible, they should stay  in another room, or be separated from the patient as much as possible. Use a separate bathroom, if available.  Restrict visitors who do not have an essential need to be in the home.  Keep older adults, very young children, and other sick people away from the patient Keep older adults, very young children, and those who have compromised immune systems or chronic health conditions away from the patient. This includes people with  chronic heart, lung, or kidney conditions, diabetes, and cancer.  Ensure good ventilation Make sure that shared spaces in the home have good air flow, such as from an air conditioner or an opened window, weather permitting.  Wash your hands often  Wash your hands often and thoroughly with soap and water for at least 20 seconds. You can use an alcohol based hand sanitizer if soap and water are not available and if your hands are not visibly dirty.  Avoid touching your eyes, nose, and mouth with unwashed hands.  Use disposable paper towels to dry your hands. If not available, use dedicated cloth towels and replace them when they become wet.  Wear a facemask and gloves  Wear a disposable facemask at all times in the room and gloves when you touch or have contact with the patients blood, body fluids, and/or secretions or excretions, such as sweat, saliva, sputum, nasal mucus, vomit, urine, or feces.  Ensure the mask fits over your nose and mouth tightly, and do not touch it during use.  Throw out disposable facemasks and gloves after using them. Do not reuse.  Wash your hands immediately after removing your facemask and gloves.  If your personal clothing becomes contaminated, carefully remove clothing and launder. Wash your hands after handling contaminated clothing.  Place all used disposable facemasks, gloves, and other waste in a lined container before disposing them with other household waste.  Remove gloves and wash your hands immediately after handling these items.  Do not share dishes, glasses, or other household items with the patient  Avoid sharing household items. You should not share dishes, drinking glasses, cups, eating utensils, towels, bedding, or other items with a patient who is confirmed to have, or being evaluated for, COVID-19 infection.  After the person uses these items, you should wash them thoroughly with soap and water.  Wash laundry thoroughly  Immediately  remove and wash clothes or bedding that have blood, body fluids, and/or secretions or excretions, such as sweat, saliva, sputum, nasal mucus, vomit, urine, or feces, on them.  Wear gloves when handling laundry from the patient.  Read and follow directions on labels of laundry or clothing items and detergent. In general, wash and dry with the warmest temperatures recommended on the label.  Clean all areas the individual has used often  Clean all touchable surfaces, such as counters, tabletops, doorknobs, bathroom fixtures, toilets, phones, keyboards, tablets, and bedside tables, every day. Also, clean any surfaces that may have blood, body fluids, and/or secretions or excretions on them.  Wear gloves when cleaning surfaces the patient has come in contact with.  Use a diluted bleach solution (e.g., dilute bleach with 1 part bleach and 10 parts water) or a household disinfectant with a label that says EPA-registered for coronaviruses. To make a bleach solution at home, add 1 tablespoon of bleach to 1 quart (4 cups) of water. For a larger supply, add  cup of bleach to 1 gallon (16 cups) of water.  Read labels of cleaning products and follow recommendations provided on product labels. Labels contain instructions for safe and effective use of the cleaning product including precautions you should take when applying the product, such as wearing gloves or eye protection and making sure you have good ventilation during use of the product.  Remove gloves and wash hands immediately after cleaning.  Monitor yourself for signs and symptoms of illness Caregivers and household members are considered close contacts, should monitor their health, and will be asked to limit movement outside of the home to the extent possible. Follow the monitoring steps for close contacts listed on the symptom monitoring form.   ? If you have additional questions, contact your local health department or call the epidemiologist  on call at [phone] (available 24/7). ? This guidance is subject to change. For the most up-to-date guidance from [facility], please refer to their website: YouBlogs.pl   Information on my medicine - ELIQUIS (apixaban)  This medication education was reviewed with me or my healthcare representative as part of my discharge preparation.  The pharmacist that spoke with me during my hospital stay was:  Lavenia Atlas, [facility]  Why was Eliquis prescribed for you? Eliquis was prescribed for you to reduce the risk of a blood clot forming that can cause a stroke if you have a medical condition called atrial fibrillation (a type of irregular heartbeat).  What do You need to know about Eliquis ? Take your Eliquis TWICE DAILY - one tablet in the morning and one tablet in the evening with or without food. If you have difficulty swallowing the tablet whole please discuss with your pharmacist how to take the medication safely.  Take Eliquis exactly as prescribed by your doctor and DO NOT stop taking Eliquis without talking to the doctor who prescribed  the medication.  Stopping may increase your risk of developing a stroke.  Refill your prescription before you run out.  After discharge, you should have regular check-up appointments with your healthcare provider that is prescribing your Eliquis.  In the future your dose may need to be changed if your kidney function or weight changes by a significant amount or as you get older.  What do you do if you miss a dose? If you miss a dose, take it as soon as you remember on the same day and resume taking twice daily.  Do not take more than one dose of ELIQUIS at the same time to make up a missed dose.  Important Safety Information A possible side effect of Eliquis is bleeding. You should call your healthcare provider right away if you experience any of the following: ? Bleeding from an injury  or your nose that does not stop. ? Unusual colored urine (red or dark brown) or unusual colored stools (red or black). ? Unusual bruising for unknown reasons. ? A serious fall or if you hit your head (even if there is no bleeding).  Some medicines may interact with Eliquis and might increase your risk of bleeding or clotting while on Eliquis. To help avoid this, consult your healthcare provider or pharmacist prior to using any new prescription or non-prescription medications, including herbals, vitamins, non-steroidal anti-inflammatory drugs (NSAIDs) and supplements.  This website has more information on Eliquis (apixaban): http://www.eliquis.com/eliquis/home

## 2019-02-13 NOTE — Care Management Important Message (Signed)
Important Message  Patient Details  Name: [name] MRN: [id number] Date of Birth: August 17, 1945   Medicare Important Message Given:  Yes - Important Message mailed due to current National Emergency  Verbal consent obtained due to current National Emergency  Relationship to patient: Child Contact Name: Ms Matejka Call Date: 02/13/19  Time: 1531   Outcome: Spoke with contact Important Message mailed to: Patient address on file    Delorse Lek 02/13/2019, 3:31 PM

## 2019-02-13 NOTE — Progress Notes (Signed)
Spoke with the pt's daughter Colletta [place] regarding his discharge instructions and the process for picking up the pt. I answered all of her questions and instructed her on the follow-up appointments for the pt. She verbalized an understanding. Discharge pickup planned for 1400 today.

## 2019-02-13 NOTE — Plan of Care (Signed)

## 2019-02-13 NOTE — Discharge Summary (Signed)
Darrell Lang [id number] DOB: 07-02-1945 DOA: 02/08/2019  PCP: System, Pcp Not In  Admit date: 02/08/2019  Discharge date: 02/13/2019  Admitted From: Home   Disposition:  Home   Recommendations for Outpatient Follow-up:   Follow up with PCP in 1-2 weeks  PCP Please obtain BMP/CBC, 2 view CXR in 1week,  (see Discharge instructions)   PCP Please follow up on the following pending results:    Home Health: RN,PT   Equipment/Devices: Conservation officer, nature  Consultations: Cards Discharge Condition: Stable    CODE STATUS: Full    Diet Recommendation: Heart Healthy Low Carb - Soft     Chief Complaint  Patient presents with  . Shortness of Breath    COVID positive     Brief history of present illness from the day of admission and additional interim summary     73 year old male with PMH of HTN, HLD, DM 2, anxiety and depression presented with complaints of fever, dry cough and dyspnea. Now diagnosed with COVID-19 pneumonia, hypoxia, new onset A. fib. Started on IV Cardizem and heparin drip. Rate controlled. Transitionedto oral Cardizemand consolidated to Cardizem CD. Initiated Eliquis. Transferring to [facility] 9/28.                                                                  Hospital Course    1.  Acute Hypoxic Resp. Failure due to Acute Covid 19 Viral Pneumonitis during the ongoing 2020 Covid 19 Pandemic - he has been treated with steroids along with IV Remdisvir starting on 02/09/2019.  Seems to have responded the treatment well, inflammatory markers are stable and he is currently stable on room air.  Continue supportive care.  [place]    02/10/19 1105 02/11/19 0255 02/12/19 0400 02/13/19 0256  DDIMER  --  0.38 <0.27 0.33  FERRITIN 418* 326 226  --   LDH 229*  222* 201* 186  CRP 5.2* 3.1* 1.6*  --     Lab Results  Component Value Date   SARSCOV2NAA Detected (A) 02/06/2019    2.  New onset A. fib with RVR [place] vas 2 score of at least 3.  Echocardiogram noted with preserved EF, currently on Cardizem for rate control, dose adjusted again on 02/12/2019 for better control, Eliquis for anticoagulation.    Likely also has undiagnosed hyper thyroidism and has been placed on low-dose methimazole with outpatient endocrine and cardiology follow-up recommended upon discharge.  3.  Essential hypertension.  Currently on combination of ARB and Cardizem.  Blood pressure still high will go up on Cardizem further on 02/12/2019.  4.  Dyslipidemia.  Stable on statin.  5.  Mildly low TSH.  With high free T4 suggesting possibly having undiagnosed hyperthyroidism, placed on methimazole with outpatient endocrine and PCP follow-up.  6.  GERD.  On PPI continue.  8.  Severe toxic encephalopathy/hospital-acquired delirium.  Placed on Seroquel, dose adjusted, PRN Haldol added.  Minimize narcotics and benzodiazepines.  Sitter for now.  9.  Anxiety and depression.  Continue Celexa.  10.  DM type II.  Poor outpatient control due to hyperglycemia A1c was 8.6.    Will be discharged on home regimen requested to follow with PCP for better glycemic control also requested to follow with endocrine one time.   Discharge diagnosis     Principal Problem:   Atrial fibrillation with RVR ([place]) Active Problems:   COVID-19 virus infection   Essential hypertension   Hyperlipidemia   Diabetes [facility])    Discharge instructions    Discharge Instructions    Discharge instructions   Complete by: As directed    Follow with Primary MD in 7 days   Get CBC, CMP, TSH, free T4, 2 view Chest X ray -  checked next visit within 1 week by Primary MD    Activity: As tolerated with Full fall precautions use walker/cane & assistance as needed  Disposition Home   Diet: Soft, low  carbohydrate heart Healthy with feeding assistance and aspiration precautions.     Special Instructions: If you have smoked or chewed Tobacco  in the last 2 yrs please stop smoking, stop any regular Alcohol  and or any Recreational drug use.  On your next visit with your primary care physician please Get Medicines reviewed and adjusted.  Please request your Prim.MD to go over all Hospital Tests and Procedure/Radiological results at the follow up, please get all Hospital records sent to your Prim MD by signing hospital release before you go home.  If you experience worsening of your admission symptoms, develop shortness of breath, life threatening emergency, suicidal or homicidal thoughts you must seek medical attention immediately by calling 911 or calling your MD immediately  if symptoms less severe.  You Must read complete instructions/literature along with all the possible adverse reactions/side effects for all the Medicines you take and that have been prescribed to you. Take any new Medicines after you have completely understood and accpet all the possible adverse reactions/side effects.   Increase activity slowly   Complete by: As directed    MyChart COVID-19 home monitoring program   Complete by: Feb 13, 2019    Is the patient willing to use the [place] for home monitoring?: Yes   Temperature monitoring   Complete by: Feb 13, 2019    After how many days would you like to receive a notification of this patient's flowsheet entries?: 1      Discharge Medications   Allergies as of 02/13/2019   No Known Allergies     Medication List    TAKE these medications   apixaban 5 MG Tabs tablet Commonly known as: ELIQUIS Take 1 tablet (5 mg total) by mouth 2 (two) times daily.   aspirin 81 MG tablet Take 81 mg by mouth daily.   B-12 PO Take 1 tablet by mouth daily.   benztropine 1 MG tablet Commonly known as: COGENTIN Take 1 mg by mouth at bedtime.   citalopram 20 MG  tablet Commonly known as: CELEXA Take 20 mg by mouth daily.   clonazePAM 0.5 MG tablet Commonly known as: KLONOPIN Take 0.5 mg by mouth 2 (two) times daily.   cloNIDine 0.1 MG tablet Commonly known as: CATAPRES Take 0.1 mg by mouth daily.   diltiazem 300 MG 24 hr  capsule Commonly known as: CARDIZEM CD Take 1 capsule (300 mg total) by mouth daily.   hydrochlorothiazide 25 MG tablet Commonly known as: HYDRODIURIL Take 25 mg by mouth daily.   losartan 100 MG tablet Commonly known as: COZAAR Take 100 mg by mouth daily.   metFORMIN 1000 MG tablet Commonly known as: GLUCOPHAGE Take 1,000 mg by mouth 2 (two) times daily with a meal.   methimazole 5 MG tablet Commonly known as: TAPAZOLE Take 1 tablet (5 mg total) by mouth daily.   multivitamin tablet Take 1 tablet by mouth daily.   mupirocin ointment 2 % Commonly known as: BACTROBAN Apply 1 application topically every evening. Applied to affected toe   omeprazole 20 MG capsule Commonly known as: PRILOSEC Take 20 mg by mouth daily.   QUEtiapine 200 MG tablet Commonly known as: SEROQUEL Take 100 mg by mouth at bedtime.   risperiDONE 1 MG tablet Commonly known as: RISPERDAL Take 1 mg by mouth at bedtime.   simvastatin 80 MG tablet Commonly known as: ZOCOR Take 40 mg by mouth at bedtime.   tamsulosin 0.4 MG Caps capsule Commonly known as: FLOMAX Take 1 capsule (0.4 mg total) by mouth daily.            Durable Medical Equipment  (From admission, onward)         Start     Ordered   02/13/19 0850  For home use only DME Walker rolling  Once    Comments: 5 wheel  Question:  Patient needs a walker to treat with the following condition  Answer:  Weakness   02/13/19 0849          Follow-up Information    Your PCP. Schedule an appointment as soon as possible for a visit in 1 week(s).        Nahser, Wonda Cheng, MD. Schedule an appointment as soon as possible for a visit in 2 week(s).   Specialty: Cardiology  Why: Af[place] Contact information: [address] [place] [address] [phone]        Renato Shin, MD. Schedule an appointment as soon as possible for a visit in 1 week(s).   Specialty: Endocrinology Why: Hyperthyroidism  Contact information: [address] [place] [address] [phone]           Major procedures and Radiology Reports - PLEASE review detailed and final reports thoroughly  -     TTE 02/09/19 -  1. Left ventricular ejection fraction, by visual estimation, is 55 to 60%. The left ventricle has normal function. Normal left ventricular size. There is mild left ventricular hypertrophy. 2. Global right ventricle has normal systolic function.The right ventricular size is normal. No increase in right ventricular wall thickness. 3. Left atrial size was normal. 4. Right atrial size was normal. 5. The mitral valve is normal in structure. No evidence of mitral valve regurgitation. 6. The tricuspid valve is grossly normal. Tricuspid valve regurgitation is trivial. 7. The aortic valve is normal in structure. Aortic valve regurgitation was not visualized by color flow Doppler. 8. The pulmonic valve was not well visualized. Pulmonic valve regurgitation is not visualized by color flow Doppler. 9. TR signal is inadequate for assessing pulmonary artery systolic pressure. 10. The inferior vena cava is dilated in size with >50% respiratory variability, suggesting right atrial pressure of 8 mmHg.   Ct Angio Chest Pe W Or Wo Contrast  Result Date: 02/08/2019 CLINICAL DATA:  Shortness of breath. Atrial fibrillation  with RVR. COVID-19 positive. EXAM: CT ANGIOGRAPHY CHEST WITH CONTRAST TECHNIQUE: Multidetector CT imaging of the chest was performed using the standard protocol during bolus administration of intravenous contrast. Multiplanar CT image reconstructions and MIPs were obtained to evaluate the vascular anatomy. CONTRAST:  60mL  OMNIPAQUE IOHEXOL 350 MG/ML SOLN COMPARISON:  Radiograph earlier this day. FINDINGS: Cardiovascular: There are no filling defects within the pulmonary arteries to suggest pulmonary embolus. The thoracic aorta is normal in caliber. No aortic dissection. Conventional branching pattern from the aortic arch. The heart is normal in size. No pericardial effusion. Few coronary artery calcifications. Mediastinum/Nodes: Increased number of mildly enlarged bilateral hilar lymph nodes, 11 mm greatest dimension. No mediastinal adenopathy. No visualized thyroid nodule. Esophagus slightly patulous with small hiatal hernia. Lungs/Pleura: Multifocal bilateral ground-glass opacities in the peripheral and basilar predominant distribution. Mild biapical pleuroparenchymal scarring. There is retained mucus in the dependent trachea and mainstem bronchi. Mild central bronchial thickening. Questionable mild pulmonary edema septal thickening at the bases. No confluent airspace disease. No pleural fluid. Upper Abdomen: No acute finding. Musculoskeletal: There are no acute or suspicious osseous abnormalities. Review of the MIP images confirms the above findings. IMPRESSION: 1. No pulmonary embolus. 2. Multifocal peripheral and basilar predominant ground-glass opacities in both lungs, pattern consistent with COVID-19 pneumonia. 3. Mild central bronchial thickening with retained mucus in the trachea and mainstem bronchi. 4. Questionable mild pulmonary edema at the bases septal thickening. 5. Mildly enlarged bilateral hilar lymph nodes are likely reactive, but nonspecific. Adenopathy in the setting of COVID-19 is unusual, and may suggest superimposed bacterial infection. Aortic Atherosclerosis (ICD10-I70.0). Electronically Signed   By: Keith Rake M.D.   On: 02/08/2019 23:32   Dg Chest Portable 1 View  Result Date: 02/08/2019 CLINICAL DATA:  Shortness of breath EXAM: PORTABLE CHEST 1 VIEW COMPARISON:  None. FINDINGS: No focal opacity or  pleural effusion. Borderline cardiomegaly with central vascular congestion. No pneumothorax. IMPRESSION: Borderline cardiomegaly with central vascular congestion Electronically Signed   By: Donavan Foil M.D.   On: 02/08/2019 21:14    Micro Results     Recent Results (from the past 240 hour(s))  Novel Coronavirus, NAA ([facility])     Status: Abnormal   Collection Time: 02/06/19 12:00 AM   Specimen: Oropharyngeal(OP) collection in vial transport medium   OROPHARYNGEA  TESTING  Result Value Ref Range Status   SARS-CoV-2, NAA Detected (A) Not Detected Final    Comment: Testing was performed using the cobas(R) SARS-CoV-2 test. This nucleic acid amplification test was developed and its performance characteristics determined by [employer]. Nucleic acid amplification tests include PCR and TMA. This test has not been FDA cleared or approved. This test has been authorized by FDA under an Emergency Use Authorization (EUA). This test is only authorized for the duration of time the declaration that circumstances exist justifying the authorization of the emergency use of in vitro diagnostic tests for detection of SARS-CoV-2 virus and/or diagnosis of COVID-19 infection under section 564(b)(1) of the Act, 21 U.S.C. [id number]) (1), unless the authorization is terminated or revoked sooner. When diagnostic testing is negative, the possibility of a false negative result should be considered in the context of a patient's recent exposures and the presence of clinical signs and symptoms consistent with COVID-19. An individual without symptoms  of COVID-19 and who is not shedding SARS-CoV-2 virus would expect to have a negative (not detected) result in this assay.   Culture, blood (Routine X 2) w Reflex to ID Panel  Status: None   Collection Time: 02/08/19  8:27 PM   Specimen: Right Antecubital; Blood  Result Value Ref Range Status   Specimen Description RIGHT ANTECUBITAL  Final   Special  Requests   Final    BOTTLES DRAWN AEROBIC AND ANAEROBIC Blood Culture results may not be optimal due to an excessive volume of blood received in culture bottles   Culture   Final    NO GROWTH 5 DAYS Performed at [facility], [address], [place], [place] [address]    Report Status 02/13/2019 FINAL  Final  Culture, blood (Routine X 2) w Reflex to ID Panel     Status: None (Preliminary result)   Collection Time: 02/09/19 12:59 AM   Specimen: BLOOD RIGHT FOREARM  Result Value Ref Range Status   Specimen Description BLOOD RIGHT FOREARM  Final   Special Requests   Final    BOTTLES DRAWN AEROBIC AND ANAEROBIC Blood Culture adequate volume   Culture   Final    NO GROWTH 4 DAYS Performed at [facility], [address], [place], [place] [address]    Report Status PENDING  Incomplete  MRSA PCR Screening     Status: None   Collection Time: 02/09/19  4:04 AM   Specimen: Nasopharyngeal  Result Value Ref Range Status   MRSA by PCR NEGATIVE NEGATIVE Final    Comment:        The GeneXpert MRSA Assay (FDA approved for NASAL specimens only), is one component of a comprehensive MRSA colonization surveillance program. It is not intended to diagnose MRSA infection nor to guide or monitor treatment for MRSA infections. Performed at [facility], [place] [address] [place], [place] [address]     Today   Subjective    Elzy Grossnickle today has no headache,no chest abdominal pain,no new weakness tingling or numbness, feels much better wants to go home today.    Objective   Blood pressure 133/84, pulse 96, temperature 98.3 F (36.8 C), temperature source Oral, resp. rate 18, height 5\' 11"  (1.803 m), weight 86.7 kg, SpO2 98 %.   Intake/Output Summary (Last 24 hours) at 02/13/2019 0902 Last data filed at 02/13/2019 0750 Gross per 24 hour  Intake 970 ml  Output 1400 ml  Net -430 ml    Exam  Awake mildly confused, No new F.N deficits, Normal affect [place].AT,PERRAL Supple Neck,No JVD,  No cervical lymphadenopathy appriciated.  Symmetrical Chest wall movement, Good air movement bilaterally, CTAB iRRR,No Gallops,Rubs or new Murmurs, No Parasternal Heave +ve B.Sounds, Abd Soft, Non tender, No organomegaly appriciated, No rebound -guarding or rigidity. No Cyanosis, Clubbing or edema, No new Rash or bruise   Data Review   CBC w Diff:  Lab Results  Component Value Date   WBC 11.1 (H) 02/13/2019   HGB 13.2 02/13/2019   HCT 40.7 02/13/2019   PLT 239 02/13/2019   LYMPHOPCT 7 02/13/2019   MONOPCT 11 02/13/2019   EOSPCT 0 02/13/2019   BASOPCT 0 02/13/2019    CMP:  Lab Results  Component Value Date   NA 142 02/13/2019   K 3.9 02/13/2019   CL 108 02/13/2019   CO2 26 02/13/2019   BUN 23 02/13/2019   CREATININE 0.87 02/13/2019   PROT 6.2 (L) 02/13/2019   ALBUMIN 3.3 (L) 02/13/2019   BILITOT 0.4 02/13/2019   ALKPHOS 55 02/13/2019   AST 14 (L) 02/13/2019   ALT 16 02/13/2019  . Lab Results  Component Value Date   TSH 0.207 (L) 02/11/2019  Free T4 0.61 - 1.12 ng/dL 1.71High   1.60High  CM  1.53High       Total Time in preparing paper work, data evaluation and todays exam - 6 minutes  Lala Lund M.D on 02/13/2019 at 9:02 AM  [facility]   Office  [phone]

## 2019-02-14 LAB — CULTURE, BLOOD (ROUTINE X 2)
Culture: NO GROWTH
Special Requests: ADEQUATE

## 2019-02-23 NOTE — Progress Notes (Signed)
Cardiology Office Note:    Date:  02/24/2019   ID:  Darrell Lang, DOB 1946-02-01, MRN [id number]  PCP:  System, Pcp Not In  Cardiologist:  Nahser  Electrophysiologist:  None   Referring MD: No ref. provider found   Chief Complaint  Patient presents with  . Atrial Fibrillation    February 24, 2019   Darrell Lang is a 73 y.o. male with a hx of HTN, HLD, DM2, anxiety / depression who was admitted recently with COVID 19 and atrial fib with RVR .  Was found to have hyperthyroidism at that time   We are asked to see him today for further management of his atrial fibrillation by Dr. Candiss Norse  Had new onset Afib with CHADS2VASC score of 3 ( age 51, HTN, DM)  Was found to have hyperthyroidism during hospitalization ,  Started on Methimazole during hospitalization    Both wife and patient had COVID  Has quarintined for > 14 days post covid .   HR feels basically normal at this point  Has some DOE with strenuous work .   Goes to the [place] for his primary MD  Echocardiogram performed September 27 reveals normal left ventricular systolic function.  He has mild left ventricular hypertrophy.  No significant valvular abnormalities.   Past Medical History:  Diagnosis Date  . Anxiety and depression   . Atrial fibrillation with RVR ([place]) 02/08/2019  . COVID-19 virus infection 02/09/2019  . Diabetes ([place])   . Encounter for screening colonoscopy 07/28/2013  . Essential hypertension 02/09/2019  . Hyperlipidemia 02/09/2019  . Hypertension   . Hypertriglyceridemia     Past Surgical History:  Procedure Laterality Date  . APPENDECTOMY    . COLONOSCOPY  Sept 2005   Dr. Gala Romney: internal hemorrhoids, otherwise normal rectum. Normal colon and normal TI  . COLONOSCOPY N/A 08/06/2013   Procedure: COLONOSCOPY;  Surgeon: Daneil Dolin, MD;  [facility];  Service: Endoscopy;  Laterality: N/A;  10:15    Current Medications: Current Meds  Medication Sig  . apixaban (ELIQUIS) 5 MG  TABS tablet Take 1 tablet (5 mg total) by mouth 2 (two) times daily.  . benztropine (COGENTIN) 1 MG tablet Take 1 mg by mouth at bedtime.  . citalopram (CELEXA) 20 MG tablet Take 20 mg by mouth daily.  . clonazePAM (KLONOPIN) 0.5 MG tablet Take 0.5 mg by mouth 2 (two) times daily.   . cloNIDine (CATAPRES) 0.1 MG tablet Take 0.1 mg by mouth daily.  . Cyanocobalamin (B-12 PO) Take 1 tablet by mouth daily.  . empagliflozin (JARDIANCE) 10 MG TABS tablet Take 10 mg by mouth daily.  Marland Kitchen glipiZIDE (GLUCOTROL) 5 MG tablet Take 10 mg by mouth 2 (two) times daily before a meal.  . losartan (COZAAR) 100 MG tablet Take 100 mg by mouth daily.  . metFORMIN (GLUCOPHAGE) 1000 MG tablet Take 1,000 mg by mouth 2 (two) times daily with a meal. 1000mg  AM; 1500mg  PM  . methimazole (TAPAZOLE) 5 MG tablet Take 1 tablet (5 mg total) by mouth daily.  . Multiple Vitamin (MULTIVITAMIN) tablet Take 1 tablet by mouth daily.  . mupirocin ointment (BACTROBAN) 2 % Apply 1 application topically every evening. Applied to affected toe  . omeprazole (PRILOSEC) 20 MG capsule Take 20 mg by mouth daily.  . QUEtiapine (SEROQUEL) 200 MG tablet Take 100 mg by mouth at bedtime.   . risperiDONE (RISPERDAL) 1 MG tablet Take 1 mg by mouth at bedtime.  . simvastatin (ZOCOR)  80 MG tablet Take 40 mg by mouth at bedtime.   . tamsulosin (FLOMAX) 0.4 MG CAPS capsule Take 1 capsule (0.4 mg total) by mouth daily.  . [DISCONTINUED] apixaban (ELIQUIS) 5 MG TABS tablet Take 1 tablet (5 mg total) by mouth 2 (two) times daily.  . [DISCONTINUED] aspirin 81 MG tablet Take 81 mg by mouth daily.  . [DISCONTINUED] diltiazem (CARDIZEM CD) 300 MG 24 hr capsule Take 1 capsule (300 mg total) by mouth daily.     Allergies:   Patient has no known allergies.   Social History   Socioeconomic History  . Marital status: Married    Spouse name: Not on file  . Number of children: Not on file  . Years of education: Not on file  . Highest education level: Not on  file  Occupational History  . Not on file  Social Needs  . Financial resource strain: Not on file  . Food insecurity    Worry: Not on file    Inability: Not on file  . Transportation needs    Medical: Not on file    Non-medical: Not on file  Tobacco Use  . Smoking status: Former Smoker    Packs/day: 0.00    Years: 26.00    Pack years: 0.00    Types: Cigarettes  . Smokeless tobacco: Never Used  . Tobacco comment: Quit smoking in 1986  Substance and Sexual Activity  . Alcohol use: No    Comment: history of ETOH abuse in remote past, none currently.   . Drug use: No  . Sexual activity: Not on file  Lifestyle  . Physical activity    Days per week: Not on file    Minutes per session: Not on file  . Stress: Not on file  Relationships  . Social Herbalist on phone: Not on file    Gets together: Not on file    Attends religious service: Not on file    Active member of club or organization: Not on file    Attends meetings of clubs or organizations: Not on file    Relationship status: Not on file  Other Topics Concern  . Not on file  Social History Narrative  . Not on file     Family History: The patient's family history includes Stroke in his father. There is no history of Colon cancer.  ROS:   Please see the history of present illness.     All other systems reviewed and are negative.  EKGs/Labs/Other Studies Reviewed:    The following studies were reviewed today:   EKG:    Recent Labs: 02/11/2019: TSH 0.207 02/13/2019: ALT 16; B Natriuretic Peptide 215.0; BUN 23; Creatinine, Ser 0.87; Hemoglobin 13.2; Magnesium 2.5; Platelets 239; Potassium 3.9; Sodium 142  Recent Lipid Panel No results found for: CHOL, TRIG, HDL, CHOLHDL, VLDL, LDLCALC, LDLDIRECT  Physical Exam:    VS:  BP 104/60   Pulse 76   Ht 5\' 11"  (1.803 m)   Wt 188 lb (85.3 kg)   SpO2 93%   BMI 26.22 kg/m     Wt Readings from Last 3 Encounters:  02/24/19 188 lb (85.3 kg)  02/09/19 191  lb 2.2 oz (86.7 kg)  08/06/13 188 lb (85.3 kg)     GEN:  Well nourished, well developed in no acute distress HEENT: Normal NECK: No JVD; No carotid bruits LYMPHATICS: No lymphadenopathy CARDIAC: RRR, no murmurs, rubs, gallops RESPIRATORY:  Clear to auscultation without rales, wheezing or  rhonchi  ABDOMEN: Soft, non-tender, non-distended MUSCULOSKELETAL:  No edema; No deformity  SKIN: Warm and dry NEUROLOGIC:  Alert and oriented x 3 PSYCHIATRIC:  Normal affect   ASSESSMENT:    No diagnosis found. PLAN:    In order of problems listed above:  1. Paroxysmal atrial fibrillation: Stevey presents to me today for an episode of paroxysmal atrial fibrillation that occurred in the setting of a severe COVID infection.  Both he and his wife were hospitalized for 5 days with COVID.  He was found to have atrial fibrillation at that time.  He was started on Cardizem drip.  He was also found to have hyperthyroidism and was started on methimazole.  He was transitioned to p.o. Cardizem and now seeing me for follow-up.  He overall feels quite a bit better.  He notes that his blood pressure has been low.  He is on quite a few medications.  He is on clonidine for PTSD and anxiety.  He also is on losartan but this is primarily for diabetes.  At this point I think I would like to discontinue the Cardizem.  We will try him on metoprolol 25 mg twice a day which should adequately control his blood pressure and will also help control his hyperthyroidism.  It should not lower his blood pressure quite as much.  I will have him see my APP in 2 months.   Medication Adjustments/Labs and Tests Ordered: Current medicines are reviewed at length with the patient today.  Concerns regarding medicines are outlined above.  No orders of the defined types were placed in this encounter.  Meds ordered this encounter  Medications  . apixaban (ELIQUIS) 5 MG TABS tablet    Sig: Take 1 tablet (5 mg total) by mouth 2 (two) times  daily.    Dispense:  180 tablet    Refill:  3  . metoprolol tartrate (LOPRESSOR) 25 MG tablet    Sig: Take 1 tablet (25 mg total) by mouth 2 (two) times daily.    Dispense:  180 tablet    Refill:  3  . metoprolol tartrate (LOPRESSOR) 25 MG tablet    Sig: Take 1 tablet (25 mg total) by mouth 2 (two) times daily.    Dispense:  20 tablet    Refill:  0    Short supply/patient awaiting shipment from [place]    Patient Instructions  Medication Instructions:  Your physician has recommended you make the following change in your medication:  STOP Diltiazem (Cardizem) STOP Aspirin START Metoprolol tartrate (Lopressor) 25 mg twice daily  If you need a refill on your cardiac medications before your next appointment, please call your pharmacy.    Lab work: None Ordered   Testing/Procedures: None Ordered   Follow-Up: At [facility], you and your health needs are our priority.  As part of our continuing mission to provide you with exceptional heart care, we have created designated Provider Care Teams.  These Care Teams include your primary Cardiologist (physician) and Advanced Practice Providers (APPs -  Physician Assistants and Nurse Practitioners) who all work together to provide you with the care you need, when you need it. You will need a follow up appointment in:  2 months.   Dr. Acie Fredrickson advises that you see one of the following Advanced Practice Providers: Richardson Dopp, PA-C on December 15     Signed, Mertie Moores, MD  02/24/2019 5:47 PM    [place]

## 2019-02-24 ENCOUNTER — Ambulatory Visit (INDEPENDENT_AMBULATORY_CARE_PROVIDER_SITE_OTHER): Payer: Medicare Other | Admitting: Cardiovascular Disease

## 2019-02-24 ENCOUNTER — Other Ambulatory Visit: Payer: Self-pay

## 2019-02-24 ENCOUNTER — Encounter: Payer: Self-pay | Admitting: Cardiovascular Disease

## 2019-02-24 VITALS — BP 104/60 | HR 76 | Ht 71.0 in | Wt 188.0 lb

## 2019-02-24 DIAGNOSIS — I4891 Unspecified atrial fibrillation: Secondary | ICD-10-CM | POA: Diagnosis not present

## 2019-02-24 DIAGNOSIS — I10 Essential (primary) hypertension: Secondary | ICD-10-CM | POA: Diagnosis not present

## 2019-02-24 MED ORDER — METOPROLOL TARTRATE 25 MG PO TABS: 25.0000 mg | ORAL_TABLET | Freq: Two times a day (BID) | ORAL | 3 refills | Status: DC

## 2019-02-24 MED ORDER — METOPROLOL TARTRATE 25 MG PO TABS: 25.0000 mg | ORAL_TABLET | Freq: Two times a day (BID) | ORAL | 0 refills | Status: DC

## 2019-02-24 MED ORDER — APIXABAN 5 MG PO TABS: 5.0000 mg | ORAL_TABLET | Freq: Two times a day (BID) | ORAL | 3 refills | Status: AC

## 2019-02-24 NOTE — Patient Instructions (Addendum)
Medication Instructions:  Your physician has recommended you make the following change in your medication:  STOP Diltiazem (Cardizem) STOP Aspirin START Metoprolol tartrate (Lopressor) 25 mg twice daily  If you need a refill on your cardiac medications before your next appointment, please call your pharmacy.    Lab work: None Ordered   Testing/Procedures: None Ordered   Follow-Up: At [facility], you and your health needs are our priority.  As part of our continuing mission to provide you with exceptional heart care, we have created designated Provider Care Teams.  These Care Teams include your primary Cardiologist (physician) and Advanced Practice Providers (APPs -  Physician Assistants and Nurse Practitioners) who all work together to provide you with the care you need, when you need it. You will need a follow up appointment in:  2 months.   Dr. Acie Fredrickson advises that you see one of the following Advanced Practice Providers: Richardson Dopp, PA-C on December 15

## 2019-02-28 ENCOUNTER — Telehealth: Payer: Self-pay | Admitting: Endocrinology

## 2019-02-28 NOTE — Telephone Encounter (Signed)
OK 

## 2019-02-28 NOTE — Telephone Encounter (Signed)
Dr Loanne Drilling, this patient was hospitalized with COVID-19 and during his stay was diagnosed with Hyperthyroidism.  Dr Candiss Norse and Dr Acie Fredrickson both recommended that he call and schedule an appointment with you.  Patient stated that doctor in hospital during his stay spoke with you (on call maybe).  Labs are in his chart from hospitalization.  Is it ok to keep him on 03/11/2019 as scheduled new patient appointment?  Sending message since we did not actually receive a referral other patients hospitilization  Please advise,

## 2019-02-28 NOTE — Telephone Encounter (Signed)
Please refer to Dr. Ellison's response 

## 2019-03-06 ENCOUNTER — Other Ambulatory Visit: Payer: Self-pay | Admitting: Cardiovascular Disease

## 2019-03-06 MED ORDER — METOPROLOL TARTRATE 25 MG PO TABS: 25.0000 mg | ORAL_TABLET | Freq: Two times a day (BID) | ORAL | 0 refills | Status: DC

## 2019-03-06 NOTE — Telephone Encounter (Signed)
Pt's wife calling stating that pt still has not received medication from the [place] and needed a short supply of Metoprolol sent to local pharmacy. I informed pt that I would send a 30 day supply and hopefully pt's medication would be there by then. I advised the pt's wife that if they have any other problems, questions or concerns, to give our office a call back. Wife verbalized understanding.

## 2019-03-11 ENCOUNTER — Ambulatory Visit (INDEPENDENT_AMBULATORY_CARE_PROVIDER_SITE_OTHER): Payer: No Typology Code available for payment source | Admitting: Endocrinology

## 2019-03-11 ENCOUNTER — Other Ambulatory Visit: Payer: Self-pay

## 2019-03-11 ENCOUNTER — Encounter: Payer: Self-pay | Admitting: Endocrinology

## 2019-03-11 DIAGNOSIS — E0590 Thyrotoxicosis, unspecified without thyrotoxic crisis or storm: Secondary | ICD-10-CM | POA: Diagnosis not present

## 2019-03-11 MED ORDER — METHIMAZOLE 5 MG PO TABS: 5.0000 mg | ORAL_TABLET | Freq: Every day | ORAL | 0 refills | Status: DC

## 2019-03-11 NOTE — Patient Instructions (Signed)
Blood tests are requested for you today.  We'll let you know about the results.  If ever you have fever while taking methimazole, stop it and call us, even if the reason is obvious, because of the risk of a rare side-effect.  Please come back for a follow-up appointment in 6 weeks.

## 2019-03-11 NOTE — Progress Notes (Signed)
Subjective:    Patient ID: Darrell Lang, male    DOB: [birth year], 73 y.o.   MRN: [id number]  HPI Pt is ref by Dr Candiss Norse, for hyperthyroidism.  he was dx'ed with hyperthyroidism in 9/20.  He was rx'ed tapazole.  He has never had XRT to the anterior neck, or thyroid surgery.  He has never had thyroid imaging.  He does not consume kelp or any other non-prescribed thyroid medication.  He has never been on amiodarone.  He feels better since hosp d/c.  He has slight palpitations in the chest, but no assoc diaphoresis.   Past Medical History:  Diagnosis Date  . Anxiety and depression   . Atrial fibrillation with RVR ([place]) 02/08/2019  . COVID-19 virus infection 02/09/2019  . Diabetes ([place])   . Encounter for screening colonoscopy 07/28/2013  . Essential hypertension 02/09/2019  . Hyperlipidemia 02/09/2019  . Hypertension   . Hypertriglyceridemia     Past Surgical History:  Procedure Laterality Date  . APPENDECTOMY    . COLONOSCOPY  Sept 2005   Dr. Gala Romney: internal hemorrhoids, otherwise normal rectum. Normal colon and normal TI  . COLONOSCOPY N/A 08/06/2013   Procedure: COLONOSCOPY;  Surgeon: Daneil Dolin, MD;  Location[facility];  Service: Endoscopy;  Laterality: N/A;  10:15    Social History   Socioeconomic History  . Marital status: Married    Spouse name: Not on file  . Number of children: Not on file  . Years of education: Not on file  . Highest education level: Not on file  Occupational History  . Not on file  Social Needs  . Financial resource strain: Not on file  . Food insecurity    Worry: Not on file    Inability: Not on file  . Transportation needs    Medical: Not on file    Non-medical: Not on file  Tobacco Use  . Smoking status: Former Smoker    Packs/day: 0.00    Years: 26.00    Pack years: 0.00    Types: Cigarettes  . Smokeless tobacco: Never Used  . Tobacco comment: Quit smoking in 1986  Substance and Sexual Activity  . Alcohol use: No    Comment:  history of ETOH abuse in remote past, none currently.   . Drug use: No  . Sexual activity: Not on file  Lifestyle  . Physical activity    Days per week: Not on file    Minutes per session: Not on file  . Stress: Not on file  Relationships  . Social Herbalist on phone: Not on file    Gets together: Not on file    Attends religious service: Not on file    Active member of club or organization: Not on file    Attends meetings of clubs or organizations: Not on file    Relationship status: Not on file  . Intimate partner violence    Fear of current or ex partner: Not on file    Emotionally abused: Not on file    Physically abused: Not on file    Forced sexual activity: Not on file  Other Topics Concern  . Not on file  Social History Narrative  . Not on file    Current Outpatient Medications on File Prior to Visit  Medication Sig Dispense Refill  . apixaban (ELIQUIS) 5 MG TABS tablet Take 1 tablet (5 mg total) by mouth 2 (two) times daily. 180 tablet 3  .  benztropine (COGENTIN) 1 MG tablet Take 1 mg by mouth at bedtime.    . citalopram (CELEXA) 20 MG tablet Take 20 mg by mouth daily.    . clonazePAM (KLONOPIN) 0.5 MG tablet Take 0.5 mg by mouth 2 (two) times daily.     . cloNIDine (CATAPRES) 0.1 MG tablet Take 0.1 mg by mouth daily.    . Cyanocobalamin (B-12 PO) Take 1 tablet by mouth daily.    . empagliflozin (JARDIANCE) 10 MG TABS tablet Take 10 mg by mouth daily.    Marland Kitchen glipiZIDE (GLUCOTROL) 5 MG tablet Take 10 mg by mouth 2 (two) times daily before a meal.    . losartan (COZAAR) 100 MG tablet Take 100 mg by mouth daily.    . metFORMIN (GLUCOPHAGE) 1000 MG tablet Take 1,000 mg by mouth 2 (two) times daily with a meal. 1000mg  AM; 1500mg  PM    . metoprolol tartrate (LOPRESSOR) 25 MG tablet Take 1 tablet (25 mg total) by mouth 2 (two) times daily. 60 tablet 0  . Multiple Vitamin (MULTIVITAMIN) tablet Take 1 tablet by mouth daily.    Marland Kitchen omeprazole (PRILOSEC) 20 MG capsule  Take 20 mg by mouth daily.    . QUEtiapine (SEROQUEL) 200 MG tablet Take 100 mg by mouth at bedtime.     . risperiDONE (RISPERDAL) 1 MG tablet Take 1 mg by mouth at bedtime.    . simvastatin (ZOCOR) 80 MG tablet Take 40 mg by mouth at bedtime.     . [DISCONTINUED] haloperidol (HALDOL) 2 MG tablet Take 2 mg by mouth every 8 (eight) hours as needed for agitation. Just takes one tablet at bedtime     No current facility-administered medications on file prior to visit.     No Known Allergies  Family History  Problem Relation Age of Onset  . Stroke Father   . Colon cancer Neg Hx   . Thyroid disease Neg Hx     BP 120/64 (BP Location: Right Arm, Patient Position: Sitting, Cuff Size: Normal)   Pulse 64   Ht 5\' 11"  (1.803 m)   Wt 192 lb 12.8 oz (87.5 kg)   SpO2 98%   BMI 26.89 kg/m   Review of Systems denies weight loss, headache, hoarseness, diplopia, sob, diarrhea, polyuria, muscle weakness, edema, tremor, heat intolerance, easy bruising, and rhinorrhea.  Anxiety is well-controlled.     Objective:   Physical Exam VS: see vs page GEN: no distress HEAD: head: no deformity eyes: no periorbital swelling, no proptosis external nose and ears are normal NECK: supple, thyroid is not enlarged CHEST WALL: no deformity LUNGS: clear to auscultation CV: reg rate and rhythm, no murmur ABD: abdomen is soft, nontender.  no hepatosplenomegaly.  not distended.  Self-reducing ventral hernia MUSCULOSKELETAL: muscle bulk and strength are grossly normal.  no obvious joint swelling.  gait is normal and steady EXTEMITIES: no deformity.  no edema PULSES: no carotid bruit NEURO:  cn 2-12 grossly intact.   readily moves all 4's.  sensation is intact to touch on all 4's.  No tremor SKIN:  Normal texture and temperature.  No rash or suspicious lesion is visible.  Not diaphoretic NODES:  None palpable at the neck PSYCH: alert, well-oriented.  Does not appear anxious nor depressed.  CT (2020): no mention  is made of the thyroid.    Lab Results  Component Value Date   TSH 0.207 (L) 02/11/2019   T3TOTAL 66 (L) 02/12/2019   I have reviewed outside records, and summarized: Pt  was noted to have hyperthyroidism, and referred here.  He was admitted with AF and several assoc acute probs.       Assessment & Plan:  Hyperthyroidism, new, uncertain etiology.   Patient Instructions  Blood tests are requested for you today.  We'll let you know about the results.  If ever you have fever while taking methimazole, stop it and call us, even if the reason is obvious, because of the risk of a rare side-effect.  Please come back for a follow-up appointment in 6 weeks.

## 2019-03-13 LAB — TSH: TSH: 0.53 u[IU]/mL (ref 0.35–4.50)

## 2019-03-13 LAB — T4, FREE: Free T4: 0.86 ng/dL (ref 0.60–1.60)

## 2019-04-07 ENCOUNTER — Telehealth: Payer: Self-pay | Admitting: Endocrinology

## 2019-04-07 ENCOUNTER — Other Ambulatory Visit: Payer: Self-pay

## 2019-04-07 DIAGNOSIS — E0590 Thyrotoxicosis, unspecified without thyrotoxic crisis or storm: Secondary | ICD-10-CM

## 2019-04-07 MED ORDER — METHIMAZOLE 5 MG PO TABS: 5.0000 mg | ORAL_TABLET | Freq: Every day | ORAL | 0 refills | Status: DC

## 2019-04-07 NOTE — Telephone Encounter (Signed)
Patient requests the following RX if Dr. Loanne Drilling wants patient to continue with the dosage/medication listed below based on patient's recent lab results  MEDICATION: methimazole (TAPAZOLE) 5 MG tablet  PHARMACY:   [id number] - [place], [place]. [address] [phone] (Phone) [phone] (Fax)    IS THIS A 90 DAY SUPPLY :  ?  IS PATIENT OUT OF MEDICATION: No  IF NOT; HOW MUCH IS LEFT: 5 days  LAST APPOINTMENT DATE: @11 /23/2020  NEXT APPOINTMENT DATE:@12 /01/2019  DO WE HAVE YOUR PERMISSION TO LEAVE A DETAILED MESSAGE: Yes  OTHER COMMENTS:    **Let patient know to contact pharmacy at the end of the day to make sure medication is ready. **  ** Please notify patient to allow 48-72 hours to process**  **Encourage patient to contact the pharmacy for refills or they can request refills through [facility]**

## 2019-04-07 NOTE — Telephone Encounter (Signed)
Based on previous conversations pt had today, pt wanted Rx sent to the [place] but did not specify what [facility]. It appears pt is now calling and asking for Rx to be sent ti [facility]. Rx has now been sent as requested below:  methimazole (TAPAZOLE) 5 MG tablet 90 tablet 0 04/07/2019    Sig - Route: Take 1 tablet (5 mg total) by mouth daily. - Oral   Sent to pharmacy as: methimazole (TAPAZOLE) 5 MG tablet   E-Prescribing Status: Receipt confirmed by pharmacy (04/07/2019 10:51 AM EST)

## 2019-04-07 NOTE — Telephone Encounter (Signed)
Patient ph# [phone] states he is returning Ammie's call.

## 2019-04-07 NOTE — Telephone Encounter (Signed)
Returned pt call. As stated below, will need to know WHAT [facility] am to send refill request. Refill remains on hold until this question is answered.

## 2019-04-07 NOTE — Telephone Encounter (Signed)
Closed as duplicate as an encounter has already been created to address pt concerns

## 2019-04-07 NOTE — Telephone Encounter (Signed)
Called pt to clarify what [facility] he is referring to as there is a [place] in every state. Will await pt returned call.

## 2019-04-07 NOTE — Telephone Encounter (Signed)
Patient called back stating that he rather it go to the [place] instead

## 2019-04-07 NOTE — Telephone Encounter (Signed)
Patient called in wanting to know about the labs done on his visit.  MEDICATION: methimazole (TAPAZOLE) 5 MG tablet  PHARMACY:  [id number] - [place], [place] - [address] AT [place] [address]  IS THIS A 90 DAY SUPPLY :   IS PATIENT OUT OF MEDICATION:   IF NOT; HOW MUCH IS LEFT:   LAST APPOINTMENT DATE: @10 /27/2020  NEXT APPOINTMENT DATE:@12 /01/2019  DO WE HAVE YOUR PERMISSION TO LEAVE A DETAILED MESSAGE:  OTHER COMMENTS:    **Let patient know to contact pharmacy at the end of the day to make sure medication is ready. **  ** Please notify patient to allow 48-72 hours to process**  **Encourage patient to contact the pharmacy for refills or they can request refills through [facility]**

## 2019-04-23 ENCOUNTER — Ambulatory Visit: Payer: No Typology Code available for payment source | Admitting: Endocrinology

## 2019-04-27 NOTE — Progress Notes (Deleted)
Cardiology Office Note:    Date:  04/27/2019   ID:  Darrell Lang, DOB [birth year], MRN [id number]  PCP:  System, Pcp Not In  Cardiologist:  No primary care provider on file.  Electrophysiologist:  None   Referring MD: No ref. provider found   Chief Complaint: follow-up of atrial fibrillation with RVR  History of Present Illness:    Darrell Lang is a 73 y.o. male with a history of recently diagnosed atrial fibrillation wth RVR in setting of COVID-19, hypertension, hyperlipidemia, type 2 diabetes mellitus, depression, and anxiety who is followed by Dr. Cathie Olden and presents today for follow-up.  Patient was admitted from 02/08/2019 to 02/13/2019 with acute hypoxic respiratory failure due to acute COVID-19 pneumonitis. During admission, he developed atrial fibrillation with RVR. Echo showed LVEF of 55-60% with mild LVH and no significant valvular disease. He was started on IV Cardizem and transitioned to PO prior to discharge. He was also started on Eliquis. In addition, he was found to have hyperthyroidism and was started on Methimazole.   He was seen by Dr. Acie Fredrickson on 02/24/2019 for follow-up at which time he was doing much better since discharge but did report some dyspnea on exertion with strenuous work. Patient also noted that his BP had been a little low. Cardizem was discontinued and patient was started on Lopressor 25mg  twice daily. Patient was advised to follow-up in 2 months.   Patient presents today for follow-up. ***  Paroxysmal Atrial Fibrillation with RVR - Developed atrial fibrillation with RVR during recent hospitalization for acute respiratory failure secondary to COVID-19. Also found to have hyperthyroidism at that time.  - *** - Continue Lopressor 25mg  twice daily. - CHA2DS2-VASc = 3 (HTN, DM, age). Continue Eliquis 5mg  twice daily.  Hypertension - ***  Hyperlipidemia - Continue home Simvastatin 80mg  daily. - Followed by PCP.  Past Medical History:  Diagnosis  Date  . Anxiety and depression   . Atrial fibrillation with RVR ([place]) 02/08/2019  . COVID-19 virus infection 02/09/2019  . Diabetes ([place])   . Encounter for screening colonoscopy 07/28/2013  . Essential hypertension 02/09/2019  . Hyperlipidemia 02/09/2019  . Hypertension   . Hypertriglyceridemia     Past Surgical History:  Procedure Laterality Date  . APPENDECTOMY    . COLONOSCOPY  Sept 2005   Dr. Gala Romney: internal hemorrhoids, otherwise normal rectum. Normal colon and normal TI  . COLONOSCOPY N/A 08/06/2013   Procedure: COLONOSCOPY;  Surgeon: Daneil Dolin, MD;  Location: [facility];  Service: Endoscopy;  Laterality: N/A;  10:15    Current Medications: No outpatient medications have been marked as taking for the 04/29/19 encounter (Appointment) with Darrell Dopp T, PA-C.     Allergies:   Patient has no known allergies.   Social History   Socioeconomic History  . Marital status: Married    Spouse name: Not on file  . Number of children: Not on file  . Years of education: Not on file  . Highest education level: Not on file  Occupational History  . Not on file  Tobacco Use  . Smoking status: Former Smoker    Packs/day: 0.00    Years: 26.00    Pack years: 0.00    Types: Cigarettes  . Smokeless tobacco: Never Used  . Tobacco comment: Quit smoking in 1986  Substance and Sexual Activity  . Alcohol use: No    Comment: history of ETOH abuse in remote past, none currently.   . Drug use: No  .  Sexual activity: Not on file  Other Topics Concern  . Not on file  Social History Narrative  . Not on file   Social Determinants of Health   Financial Resource Strain:   . Difficulty of Paying Living Expenses: Not on file  Food Insecurity:   . Worried About Charity fundraiser in the Last Year: Not on file  . Ran Out of Food in the Last Year: Not on file  Transportation Needs:   . Lack of Transportation (Medical): Not on file  . Lack of Transportation (Non-Medical): Not on file   Physical Activity:   . Days of Exercise per Week: Not on file  . Minutes of Exercise per Session: Not on file  Stress:   . Feeling of Stress : Not on file  Social Connections:   . Frequency of Communication with Friends and Family: Not on file  . Frequency of Social Gatherings with Friends and Family: Not on file  . Attends Religious Services: Not on file  . Active Member of Clubs or Organizations: Not on file  . Attends Archivist Meetings: Not on file  . Marital Status: Not on file     Family History: The patient's ***family history includes Stroke in his father. There is no history of Colon cancer or Thyroid disease.  ROS:   Please see the history of present illness.    *** All other systems reviewed and are negative.  EKGs/Labs/Other Studies Reviewed:    The following studies were reviewed today:  Echocardiogram 02/09/2019: Impressions:  1. Left ventricular ejection fraction, by visual estimation, is 55 to 60%. The left ventricle has normal function. Normal left ventricular size. There is mild left ventricular hypertrophy.  2. Global right ventricle has normal systolic function.The right ventricular size is normal. No increase in right ventricular wall thickness.  3. Left atrial size was normal.  4. Right atrial size was normal.  5. The mitral valve is normal in structure. No evidence of mitral valve regurgitation.  6. The tricuspid valve is grossly normal. Tricuspid valve regurgitation is trivial.  7. The aortic valve is normal in structure. Aortic valve regurgitation was not visualized by color flow Doppler.  8. The pulmonic valve was not well visualized. Pulmonic valve regurgitation is not visualized by color flow Doppler.  9. TR signal is inadequate for assessing pulmonary artery systolic pressure. 10. The inferior vena cava is dilated in size with >50% respiratory variability, suggesting right atrial pressure of 8 mmHg.  EKG:  EKG ordered today. EKG  personally reviewed and demonstrates ***.  Recent Labs: 02/13/2019: ALT 16; B Natriuretic Peptide 215.0; BUN 23; Creatinine, Ser 0.87; Hemoglobin 13.2; Magnesium 2.5; Platelets 239; Potassium 3.9; Sodium 142 03/11/2019: TSH 0.53  Recent Lipid Panel No results found for: CHOL, TRIG, HDL, CHOLHDL, VLDL, LDLCALC, LDLDIRECT  Physical Exam:    Vital Signs: There were no vitals taken for this visit.    Wt Readings from Last 3 Encounters:  03/11/19 192 lb 12.8 oz (87.5 kg)  02/24/19 188 lb (85.3 kg)  02/09/19 191 lb 2.2 oz (86.7 kg)     General: 73 y.o. male in no acute distress. HEENT: Normocephalic and atraumatic. Sclera clear. EOMs intact. Neck: Supple. No carotid bruits. No JVD. Heart: *** RRR. Distinct S1 and S2. No murmurs, gallops, or rubs. Radial and distal pedal pulses 2+ and equal bilaterally. Lungs: No increased work of breathing. Clear to ausculation bilaterally. No wheezes, rhonchi, or rales.  Abdomen: Soft, non-distended, and non-tender to  palpation. Bowel sounds present in all 4 quadrants.  MSK: Normal strength and tone for age. *** Extremities: No lower extremity edema.    Skin: Warm and dry. Neuro: Alert and oriented x3. No focal deficits. Psych: Normal affect. Responds appropriately.   Assessment:    No diagnosis found.  Plan:     Disposition: Follow up in ***   Medication Adjustments/Labs and Tests Ordered: Current medicines are reviewed at length with the patient today.  Concerns regarding medicines are outlined above.  No orders of the defined types were placed in this encounter.  No orders of the defined types were placed in this encounter.   There are no Patient Instructions on file for this visit.   Signed, Darreld Mclean, PA-C  04/27/2019 11:57 AM    [place]

## 2019-04-29 ENCOUNTER — Ambulatory Visit
Admission: EM | Admit: 2019-04-29 | Discharge: 2019-04-29 | Disposition: A | Discharge status: Home / Self Care | Payer: Medicare Other

## 2019-04-29 ENCOUNTER — Ambulatory Visit: Payer: Medicare Other | Admitting: Physician Assistant

## 2019-04-29 ENCOUNTER — Other Ambulatory Visit: Payer: Self-pay

## 2019-04-29 ENCOUNTER — Encounter (HOSPITAL_COMMUNITY): Payer: Self-pay | Admitting: *Deleted

## 2019-04-29 ENCOUNTER — Emergency Department (HOSPITAL_COMMUNITY): Payer: No Typology Code available for payment source

## 2019-04-29 ENCOUNTER — Emergency Department (HOSPITAL_COMMUNITY)
Admission: EM | Admit: 2019-04-29 | Discharge: 2019-04-29 | Disposition: A | Discharge status: Home / Self Care | Payer: No Typology Code available for payment source | Attending: Emergency Medicine | Admitting: Emergency Medicine

## 2019-04-29 DIAGNOSIS — J40 Bronchitis, not specified as acute or chronic: Secondary | ICD-10-CM | POA: Insufficient documentation

## 2019-04-29 DIAGNOSIS — I10 Essential (primary) hypertension: Secondary | ICD-10-CM | POA: Diagnosis not present

## 2019-04-29 DIAGNOSIS — Z79899 Other long term (current) drug therapy: Secondary | ICD-10-CM | POA: Insufficient documentation

## 2019-04-29 DIAGNOSIS — E1165 Type 2 diabetes mellitus with hyperglycemia: Secondary | ICD-10-CM

## 2019-04-29 DIAGNOSIS — R0602 Shortness of breath: Secondary | ICD-10-CM

## 2019-04-29 DIAGNOSIS — Z87891 Personal history of nicotine dependence: Secondary | ICD-10-CM

## 2019-04-29 DIAGNOSIS — R05 Cough: Secondary | ICD-10-CM

## 2019-04-29 DIAGNOSIS — E119 Type 2 diabetes mellitus without complications: Secondary | ICD-10-CM | POA: Diagnosis not present

## 2019-04-29 DIAGNOSIS — R0902 Hypoxemia: Secondary | ICD-10-CM

## 2019-04-29 DIAGNOSIS — R058 Other specified cough: Secondary | ICD-10-CM

## 2019-04-29 DIAGNOSIS — I4891 Unspecified atrial fibrillation: Secondary | ICD-10-CM

## 2019-04-29 DIAGNOSIS — Z8616 Personal history of COVID-19: Secondary | ICD-10-CM

## 2019-04-29 DIAGNOSIS — Z7984 Long term (current) use of oral hypoglycemic drugs: Secondary | ICD-10-CM | POA: Diagnosis not present

## 2019-04-29 DIAGNOSIS — Z7901 Long term (current) use of anticoagulants: Secondary | ICD-10-CM | POA: Insufficient documentation

## 2019-04-29 DIAGNOSIS — R059 Cough, unspecified: Secondary | ICD-10-CM

## 2019-04-29 LAB — CBC WITH DIFFERENTIAL/PLATELET
Abs Immature Granulocytes: 0.07 10*3/uL (ref 0.00–0.07)
Basophils Absolute: 0.1 10*3/uL (ref 0.0–0.1)
Basophils Relative: 0 %
Eosinophils Absolute: 0 10*3/uL (ref 0.0–0.5)
Eosinophils Relative: 0 %
HCT: 42 % (ref 39.0–52.0)
Hemoglobin: 13.3 g/dL (ref 13.0–17.0)
Immature Granulocytes: 0 %
Lymphocytes Relative: 8 %
Lymphs Abs: 1.4 10*3/uL (ref 0.7–4.0)
MCH: 28.4 pg (ref 26.0–34.0)
MCHC: 31.7 g/dL (ref 30.0–36.0)
MCV: 89.6 fL (ref 80.0–100.0)
Monocytes Absolute: 1.5 10*3/uL — ABNORMAL HIGH (ref 0.1–1.0)
Monocytes Relative: 9 %
Neutro Abs: 13.4 10*3/uL — ABNORMAL HIGH (ref 1.7–7.7)
Neutrophils Relative %: 83 %
Platelets: 228 10*3/uL (ref 150–400)
RBC: 4.69 MIL/uL (ref 4.22–5.81)
RDW: 14 % (ref 11.5–15.5)
WBC: 16.4 10*3/uL — ABNORMAL HIGH (ref 4.0–10.5)
nRBC: 0 % (ref 0.0–0.2)

## 2019-04-29 LAB — BASIC METABOLIC PANEL
Anion gap: 11 (ref 5–15)
BUN: 18 mg/dL (ref 8–23)
CO2: 24 mmol/L (ref 22–32)
Calcium: 8.9 mg/dL (ref 8.9–10.3)
Chloride: 97 mmol/L — ABNORMAL LOW (ref 98–111)
Creatinine, Ser: 1.44 mg/dL — ABNORMAL HIGH (ref 0.61–1.24)
GFR calc Af Amer: 56 mL/min — ABNORMAL LOW (ref >60)
GFR calc non Af Amer: 49 mL/min — ABNORMAL LOW (ref >60)
Glucose, Bld: 154 mg/dL — ABNORMAL HIGH (ref 70–99)
Potassium: 4 mmol/L (ref 3.5–5.1)
Sodium: 132 mmol/L — ABNORMAL LOW (ref 135–145)

## 2019-04-29 LAB — LACTIC ACID, PLASMA: Lactic Acid, Venous: 1.5 mmol/L (ref 0.5–1.9)

## 2019-04-29 MED ORDER — DOXYCYCLINE HYCLATE 100 MG PO TABS
100.0000 mg | ORAL_TABLET | Freq: Once | ORAL | Status: AC
  Administered 2019-04-29: 14:00:00 100 mg via ORAL
  Filled 2019-04-29: qty 1

## 2019-04-29 MED ORDER — PREDNISONE 50 MG PO TABS
60.0000 mg | ORAL_TABLET | Freq: Once | ORAL | Status: AC
  Administered 2019-04-29: 60 mg via ORAL
  Filled 2019-04-29: qty 1

## 2019-04-29 MED ORDER — DOXYCYCLINE HYCLATE 100 MG PO CAPS: 100.0000 mg | ORAL_CAPSULE | Freq: Two times a day (BID) | ORAL | 0 refills | Status: DC

## 2019-04-29 MED ORDER — PREDNISONE 10 MG PO TABS: 40.0000 mg | ORAL_TABLET | Freq: Every day | ORAL | 0 refills | Status: AC

## 2019-04-29 MED ORDER — PREDNISONE 20 MG PO TABS
40.0000 mg | ORAL_TABLET | Freq: Every day | ORAL | Status: DC
  Administered 2019-04-29: 16:00:00 40 mg via ORAL
  Filled 2019-04-29: qty 2

## 2019-04-29 MED ORDER — ALBUTEROL SULFATE HFA 108 (90 BASE) MCG/ACT IN AERS
2.0000 | INHALATION_SPRAY | Freq: Once | RESPIRATORY_TRACT | Status: AC
  Administered 2019-04-29: 15:00:00 2 via RESPIRATORY_TRACT
  Filled 2019-04-29: qty 6.7

## 2019-04-29 MED ORDER — DOXYCYCLINE HYCLATE 100 MG PO TABS
100.0000 mg | ORAL_TABLET | Freq: Once | ORAL | Status: AC
  Administered 2019-04-29: 16:00:00 100 mg via ORAL
  Filled 2019-04-29: qty 1

## 2019-04-29 NOTE — ED Notes (Signed)
Pt ambulated well, O2 stayed at 93%.

## 2019-04-29 NOTE — ED Notes (Signed)
Respiratory called and informed of pt order for inhaler and will require teaching. Pt states never had inhaler.

## 2019-04-29 NOTE — ED Triage Notes (Signed)
Pt presents with productive cough x 2 days, pt also reports no appetite. Pt was hospitalized with covid in September .

## 2019-04-29 NOTE — ED Notes (Signed)
Family given update 

## 2019-04-29 NOTE — ED Provider Notes (Signed)
[facility] Provider Note   CSN: [id number] Arrival date & time: 04/29/19  1158    History Chief Complaint  Patient presents with  . Shortness of Breath    Darrell Lang is a 73 y.o. male with history of hypertension, hyperlipidemia, diabetes on Metformin, hypothyroidism, atrial fibrillation on metoprolol and Eliquis, COVID-19 illness September/2020 to the ER for evaluation of cough.  Onset 3 days ago.  Reports having a chronic cough with occasional wheezing and "gurgling" in his chest with breathing for a long time but states for the last 3 days these have worsened.  He has productive cough of yellow sputum.  Associated with decreased appetite, runny nose, sore throat and fatigue.  He thinks he may have bronchitis.  Went to urgent care earlier today and they referred him to the ER for oxygen level of 91 to 92%.  States if he walks far he has to slow down due to feeling winded but states this is chronic and unchanged.  Reports some shortness of breath with laying flat but states this is also not new for him.  He took Tylenol last night.  Quit smoking cigarettes in 1988.  Denies known COPD, asthma or other lung issues.  Has otherwise been feeling well.  No sick contacts.  No travel.  Has been compliant with his Eliquis without missing any doses.  Denies fever, chest pain or tightness, nausea, vomiting, abdominal pain, changes in bowel movements.  No lower extremity edema.  No PND.  HPI     Past Medical History:  Diagnosis Date  . Anxiety and depression   . Atrial fibrillation with RVR ([place]) 02/08/2019  . COVID-19 virus infection 02/09/2019  . Diabetes ([place])   . Encounter for screening colonoscopy 07/28/2013  . Essential hypertension 02/09/2019  . Hyperlipidemia 02/09/2019  . Hypertension   . Hypertriglyceridemia     Patient Active Problem List   Diagnosis Date Noted  . Hyperthyroidism 03/11/2019  . COVID-19 virus infection 02/09/2019  . Essential hypertension  02/09/2019  . Hyperlipidemia 02/09/2019  . Diabetes ([place]) 02/09/2019  . Atrial fibrillation with RVR ([place]) 02/08/2019  . Encounter for screening colonoscopy 07/28/2013    Past Surgical History:  Procedure Laterality Date  . APPENDECTOMY    . COLONOSCOPY  Sept 2005   Dr. Gala Romney: internal hemorrhoids, otherwise normal rectum. Normal colon and normal TI  . COLONOSCOPY N/A 08/06/2013   Procedure: COLONOSCOPY;  Surgeon: Daneil Dolin, MD;  Location: [facility];  Service: Endoscopy;  Laterality: N/A;  10:15       Family History  Problem Relation Age of Onset  . Stroke Father   . Colon cancer Neg Hx   . Thyroid disease Neg Hx     Social History   Tobacco Use  . Smoking status: Former Smoker    Packs/day: 0.00    Years: 26.00    Pack years: 0.00    Types: Cigarettes  . Smokeless tobacco: Never Used  . Tobacco comment: Quit smoking in 1986  Substance Use Topics  . Alcohol use: No    Comment: history of ETOH abuse in remote past, none currently.   . Drug use: No    Home Medications Prior to Admission medications   Medication Sig Start Date End Date Taking? Authorizing Provider  apixaban (ELIQUIS) 5 MG TABS tablet Take 1 tablet (5 mg total) by mouth 2 (two) times daily. 02/24/19   Nahser, Wonda Cheng, MD  benztropine (COGENTIN) 1 MG tablet Take 1 mg  by mouth at bedtime.    [provider]  citalopram (CELEXA) 20 MG tablet Take 20 mg by mouth daily.    [provider]  clonazePAM (KLONOPIN) 0.5 MG tablet Take 0.5 mg by mouth 2 (two) times daily.     [provider]  cloNIDine (CATAPRES) 0.1 MG tablet Take 0.1 mg by mouth daily.    [provider]  Cyanocobalamin (B-12 PO) Take 1 tablet by mouth daily.    [provider]  doxycycline (VIBRAMYCIN) 100 MG capsule Take 1 capsule (100 mg total) by mouth 2 (two) times daily. 04/29/19   Kinnie Feil, PA-C  empagliflozin (JARDIANCE) 10 MG TABS tablet Take 10 mg by mouth daily.     [provider]  glipiZIDE (GLUCOTROL) 5 MG tablet Take 10 mg by mouth 2 (two) times daily before a meal.    [provider]  losartan (COZAAR) 100 MG tablet Take 100 mg by mouth daily.    [provider]  metFORMIN (GLUCOPHAGE) 1000 MG tablet Take 1,000 mg by mouth 2 (two) times daily with a meal. 1000mg  AM; 1500mg  PM    [provider]  methimazole (TAPAZOLE) 5 MG tablet Take 1 tablet (5 mg total) by mouth daily. 04/07/19   Renato Shin, MD  metoprolol tartrate (LOPRESSOR) 25 MG tablet Take 1 tablet (25 mg total) by mouth 2 (two) times daily. 03/06/19   Nahser, Wonda Cheng, MD  Multiple Vitamin (MULTIVITAMIN) tablet Take 1 tablet by mouth daily.    [provider]  omeprazole (PRILOSEC) 20 MG capsule Take 20 mg by mouth daily.    [provider]  predniSONE (DELTASONE) 10 MG tablet Take 4 tablets (40 mg total) by mouth daily for 7 days. 04/29/19 05/06/19  Kinnie Feil, PA-C  QUEtiapine (SEROQUEL) 200 MG tablet Take 100 mg by mouth at bedtime.     [provider]  risperiDONE (RISPERDAL) 1 MG tablet Take 1 mg by mouth at bedtime.    [provider]  simvastatin (ZOCOR) 80 MG tablet Take 40 mg by mouth at bedtime.     [provider]  haloperidol (HALDOL) 2 MG tablet Take 2 mg by mouth every 8 (eight) hours as needed for agitation. Just takes one tablet at bedtime  01/13/19  [provider]    Allergies    Patient has no known allergies.  Review of Systems   Review of Systems  Constitutional: Positive for appetite change and fatigue.  HENT: Positive for rhinorrhea and sore throat.   Respiratory: Positive for cough and shortness of breath (Chronic, unchanged).   Hematological: Bruises/bleeds easily.  All other systems reviewed and are negative.   Physical Exam Updated Vital Signs BP 124/66   Pulse 79   Temp 99.1 F (37.3 C)   Resp 16   SpO2 94%   Physical Exam Vitals and nursing note  reviewed.  Constitutional:      General: He is not in acute distress.    Appearance: He is well-developed.     Comments: NAD.  HENT:     Head: Normocephalic and atraumatic.     Right Ear: External ear normal.     Left Ear: External ear normal.     Nose: Nose normal.     Mouth/Throat:     Comments: Mild erythema in soft palate, oropharynx.  Uvula midline.  No tonsillar hypertrophy or exudates or petechiae.  No trismus.  Moist mucous membranes.  Normal sublingual space, tongue protrusion and phonation.  Tolerating secretions.  Upper dentures noted. Eyes:     General: No scleral icterus.    Conjunctiva/sclera: Conjunctivae normal.     Comments: No cervical lymphadenopathy  Cardiovascular:     Rate and Rhythm: Normal rate and regular rhythm.     Heart sounds: Normal heart sounds. No murmur.     Comments: NSR.  Heart rate less than 100.  1+ radial and DP pulses bilaterally.  No lower extremity edema.  No calf tenderness. Pulmonary:     Effort: Pulmonary effort is normal.     Breath sounds: Wheezing and rhonchi present.     Comments: Diffuse inspiratory and expiratory wheezing/rhonchi in upper and middle lobes, slightly diminished lung sounds to the lower lobes.  Normal work of breathing.  Speaking in full sentences.  SPO2 greater than 95% while at rest during encounter. Musculoskeletal:        General: No deformity. Normal range of motion.     Cervical back: Normal range of motion and neck supple.  Skin:    General: Skin is warm and dry.     Capillary Refill: Capillary refill takes less than 2 seconds.  Neurological:     Mental Status: He is alert and oriented to person, place, and time.  Psychiatric:        Behavior: Behavior normal.        Thought Content: Thought content normal.        Judgment: Judgment normal.     ED Results / Procedures / Treatments   Labs (all labs ordered are listed, but only abnormal results are displayed) Labs Reviewed  CBC WITH DIFFERENTIAL/PLATELET -  Abnormal; Notable for the following components:      Result Value   WBC 16.4 (*)    Neutro Abs 13.4 (*)    Monocytes Absolute 1.5 (*)    All other components within normal limits  BASIC METABOLIC PANEL - Abnormal; Notable for the following components:   Sodium 132 (*)    Chloride 97 (*)    Glucose, Bld 154 (*)    Creatinine, Ser 1.44 (*)    GFR calc non Af Amer 49 (*)    GFR calc Af Amer 56 (*)    All other components within normal limits  LACTIC ACID, PLASMA    EKG None  Radiology DG Chest Port 1 View  Result Date: 04/29/2019 CLINICAL DATA:  Productive cough for 3 days. Diagnosed with COVID-19 on February 02, 2019. EXAM: PORTABLE CHEST 1 VIEW COMPARISON:  02/08/2019, chest radiographs and CT. FINDINGS: Cardiac silhouette is normal in size. No mediastinal or hilar masses. No evidence of adenopathy. Mild scarring at the apices. Prominent bronchovascular markings. Lungs mildly hyperexpanded. No evidence of pneumonia or pulmonary edema. No pleural effusion or pneumothorax. Skeletal structures are grossly intact. IMPRESSION: 1. No acute cardiopulmonary disease. Findings stable from prior chest radiograph. Electronically Signed   By: Lajean Manes M.D.   On: 04/29/2019 14:05    Procedures Procedures (including critical care time)  Medications Ordered in ED Medications  predniSONE (DELTASONE) tablet 40 mg (40 mg Oral Given 04/29/19 1530)  doxycycline (VIBRA-TABS) tablet 100 mg (100 mg Oral Given 04/29/19 1429)  albuterol (VENTOLIN HFA) 108 (90 Base) MCG/ACT inhaler 2 puff (2 puffs Inhalation Given 04/29/19 1504)  predniSONE (DELTASONE) tablet 60 mg (60 mg Oral Given 04/29/19 1429)  doxycycline (VIBRA-TABS) tablet 100 mg (100 mg Oral Given 04/29/19 1530)    ED Course  I have reviewed the triage vital signs and the nursing notes.  Pertinent labs & imaging results that were available during my care of the patient were reviewed by me and considered in my medical decision making (see  chart for details).  Clinical Course as of Apr 29 1535  Tue Apr 29, 2019  1421 WBC(!): 16.4 [CG]  1421 NEUT#(!): 13.4 [CG]  1421 Monocyte #(!): 1.5 [CG]  1421 Cardiac silhouette is normal in size. No mediastinal or hilar masses. No evidence of adenopathy.  Mild scarring at the apices. Prominent bronchovascular markings. Lungs mildly hyperexpanded. No evidence of pneumonia or pulmonary edema.  No pleural effusion or pneumothorax.  Skeletal structures are grossly intact.  DG Chest Port 1 View [CG]    Clinical Course User Index [CG] Arlean Hopping   MDM Rules/Calculators/A&P                      DDX includes viral illness, bronchitis/COPD exacerbation given remote history of tobacco use and lung sounds, pneumonia.  No signs of hypervolemia on exam, history of heart failure.  Recently had an echocardiogram with normal LVEF.  Decompensated heart failure considered unlikely.  He is compliant on Eliquis and has no chest pain, given his cough and sputum PEs also unlikely.  Labs including lactic acid, chest x-ray and EKG obtained.  COVID illness in September, I see no indication for repeat testing here.  Influenza possible.   1535: ER work-up reviewed and vastly reassuring, mild leukocytosis.  Chest x-ray without edema, effusions, infiltrates or opacities.  Patient ambulated for 2 to 3 minutes here with SPO2 greater than 93%.  He was reevaluated and has had no clinical or respiratory decline.  Given benign work-up, normal oxygen saturations will treat for bronchitis versus subclinical pneumonia.  Will discharge with doxycycline, prednisone, albuterol inhaler.  H/o DM on metformin well controlled glucose ok here. He follows up at the [place] [facility], I encouraged him to make an appointment or be reevaluated in the next 72 hours to ensure there is clinical improvement.  Strict return precautions given.  He is comfortable with this.  Discussed with EDP. Final Clinical Impression(s) / ED  Diagnoses Final diagnoses:  Bronchitis    Rx / DC Orders ED Discharge Orders         Ordered    doxycycline (VIBRAMYCIN) 100 MG capsule  2 times daily     04/29/19 1527    predniSONE (DELTASONE) 10 MG tablet  Daily     04/29/19 1527           Arlean Hopping 04/29/19 1536    Milton Ferguson, MD 04/30/19 [phone]

## 2019-04-29 NOTE — Discharge Instructions (Addendum)
You are seen in the ER for cough, phlegm, decreased appetite, runny nose, sore throat  Work-up today was normal other than a slight elevation in your white blood cell count.  Chest x-ray was normal and did not show pneumonia.  Your oxygen remained normal and greater than 93% when walking here  We will treat you for bronchitis or possibly early pneumonia.  Take doxycycline twice a day as prescribed.  Prednisone will help with inflammation in your airways.  Use albuterol inhaler 2 puffs every 4 hours to help with breathing.  You should be reevaluated in the next 72 hours to ensure your symptoms are improving  Return to the ER for worsening symptoms, fever greater than 100.4, chest pain or shortness of breath, lightheadedness with exertion

## 2019-04-29 NOTE — ED Provider Notes (Signed)
[facility]     MRN: [id number] DOB: 1945-06-07  Subjective:   Darrell Lang is a 73 y.o. male presenting for 2-day history of frequent productive cough with loss of appetite, shortness of breath and chest tightness.  Patient was hospitalized at the end of September for COVID-19 infection.  He also has a history of atrial fibrillation, essential hypertension, diabetes and hyperlipidemia.  Denies history of asthma.  He was a former smoker.  Denies history of COPD.  No current facility-administered medications for this encounter.  Current Outpatient Medications:  .  apixaban (ELIQUIS) 5 MG TABS tablet, Take 1 tablet (5 mg total) by mouth 2 (two) times daily., Disp: 180 tablet, Rfl: 3 .  benztropine (COGENTIN) 1 MG tablet, Take 1 mg by mouth at bedtime., Disp: , Rfl:  .  citalopram (CELEXA) 20 MG tablet, Take 20 mg by mouth daily., Disp: , Rfl:  .  clonazePAM (KLONOPIN) 0.5 MG tablet, Take 0.5 mg by mouth 2 (two) times daily. , Disp: , Rfl:  .  cloNIDine (CATAPRES) 0.1 MG tablet, Take 0.1 mg by mouth daily., Disp: , Rfl:  .  Cyanocobalamin (B-12 PO), Take 1 tablet by mouth daily., Disp: , Rfl:  .  empagliflozin (JARDIANCE) 10 MG TABS tablet, Take 10 mg by mouth daily., Disp: , Rfl:  .  glipiZIDE (GLUCOTROL) 5 MG tablet, Take 10 mg by mouth 2 (two) times daily before a meal., Disp: , Rfl:  .  losartan (COZAAR) 100 MG tablet, Take 100 mg by mouth daily., Disp: , Rfl:  .  metFORMIN (GLUCOPHAGE) 1000 MG tablet, Take 1,000 mg by mouth 2 (two) times daily with a meal. 1000mg  AM; 1500mg  PM, Disp: , Rfl:  .  methimazole (TAPAZOLE) 5 MG tablet, Take 1 tablet (5 mg total) by mouth daily., Disp: 90 tablet, Rfl: 0 .  metoprolol tartrate (LOPRESSOR) 25 MG tablet, Take 1 tablet (25 mg total) by mouth 2 (two) times daily., Disp: 60 tablet, Rfl: 0 .  Multiple Vitamin (MULTIVITAMIN) tablet, Take 1 tablet by mouth daily., Disp: , Rfl:  .  omeprazole (PRILOSEC) 20 MG capsule, Take 20 mg by  mouth daily., Disp: , Rfl:  .  QUEtiapine (SEROQUEL) 200 MG tablet, Take 100 mg by mouth at bedtime. , Disp: , Rfl:  .  risperiDONE (RISPERDAL) 1 MG tablet, Take 1 mg by mouth at bedtime., Disp: , Rfl:  .  simvastatin (ZOCOR) 80 MG tablet, Take 40 mg by mouth at bedtime. , Disp: , Rfl:    No Known Allergies  Past Medical History:  Diagnosis Date  . Anxiety and depression   . Atrial fibrillation with RVR ([place]) 02/08/2019  . COVID-19 virus infection 02/09/2019  . Diabetes ([place])   . Encounter for screening colonoscopy 07/28/2013  . Essential hypertension 02/09/2019  . Hyperlipidemia 02/09/2019  . Hypertension   . Hypertriglyceridemia      Past Surgical History:  Procedure Laterality Date  . APPENDECTOMY    . COLONOSCOPY  Sept 2005   Dr. Gala Romney: internal hemorrhoids, otherwise normal rectum. Normal colon and normal TI  . COLONOSCOPY N/A 08/06/2013   Procedure: COLONOSCOPY;  Surgeon: Daneil Dolin, MD;  [facility];  Service: Endoscopy;  Laterality: N/A;  10:15    Family History  Problem Relation Age of Onset  . Stroke Father   . Colon cancer Neg Hx   . Thyroid disease Neg Hx     Social History   Tobacco Use  . Smoking status: Former Smoker  Packs/day: 0.00    Years: 26.00    Pack years: 0.00    Types: Cigarettes  . Smokeless tobacco: Never Used  . Tobacco comment: Quit smoking in 1986  Substance Use Topics  . Alcohol use: No    Comment: history of ETOH abuse in remote past, none currently.   . Drug use: No    Review of Systems  Constitutional: Negative for fever and malaise/fatigue.  HENT: Negative for congestion, ear pain, sinus pain and sore throat.   Eyes: Negative for discharge and redness.  Respiratory: Positive for cough and shortness of breath. Negative for hemoptysis and wheezing.   Cardiovascular: Negative for chest pain.  Gastrointestinal: Negative for abdominal pain, diarrhea, nausea and vomiting.  Genitourinary: Negative for dysuria, flank  pain and hematuria.  Musculoskeletal: Negative for myalgias.  Skin: Negative for rash.  Neurological: Negative for dizziness, weakness and headaches.  Psychiatric/Behavioral: Negative for depression and substance abuse.     Objective:   Vitals: BP (!) 104/56   Pulse 74   Temp (!) 97.4 F (36.3 C)   Resp 20   SpO2 93%   Oxygen saturation ranged between 91 to 93%.  Wt Readings from Last 3 Encounters:  03/11/19 192 lb 12.8 oz (87.5 kg)  02/24/19 188 lb (85.3 kg)  02/09/19 191 lb 2.2 oz (86.7 kg)   Temp Readings from Last 3 Encounters:  02/13/19 98.3 F (36.8 C) (Oral)  01/13/19 98 F (36.7 C)  08/06/13 97.4 F (36.3 C) (Oral)   BP Readings from Last 3 Encounters:  03/11/19 120/64  02/24/19 104/60  02/13/19 133/84   Pulse Readings from Last 3 Encounters:  03/11/19 64  02/24/19 76  02/13/19 (!) 102   Physical Exam Constitutional:      General: He is not in acute distress.    Appearance: Normal appearance. He is well-developed. He is not ill-appearing, toxic-appearing or diaphoretic.  HENT:     Head: Normocephalic and atraumatic.     Right Ear: External ear normal.     Left Ear: External ear normal.     Nose: Nose normal.     Mouth/Throat:     Mouth: Mucous membranes are moist.     Pharynx: Oropharynx is clear.  Eyes:     General: No scleral icterus.    Extraocular Movements: Extraocular movements intact.     Pupils: Pupils are equal, round, and reactive to light.  Cardiovascular:     Rate and Rhythm: Normal rate and regular rhythm.     Heart sounds: Normal heart sounds. No murmur. No friction rub. No gallop.   Pulmonary:     Effort: Pulmonary effort is normal. No respiratory distress.     Breath sounds: No stridor. Rales (Mid to lower lung fields bilaterally) present. No wheezing or rhonchi.  Neurological:     Mental Status: He is alert and oriented to person, place, and time.  Psychiatric:        Mood and Affect: Mood normal.        Behavior: Behavior  normal.        Thought Content: Thought content normal.      Assessment and Plan :   1. Shortness of breath   2. History of 2019 novel coronavirus disease (COVID-19)   3. Productive cough   4. Hypoxemia   5. Atrial fibrillation, unspecified type ([place])   6. Uncontrolled type 2 diabetes mellitus with hyperglycemia ([place])   7. Former smoker     Patient shortness of breath, physical  exam findings and hypoxemia very concerning given his recent history of COVID-19 infection that resulted in hospitalization.  Unfortunately due to COVID-19, we do not have staff members that are able to obtain a chest x-ray.  Patient also has multiple comorbidities including uncontrolled diabetes, atrial fibrillation and is very high risk.  I redirected patient to the emergency room at [facility] for an emergent evaluation.  Patient contracts for safety and will report there now.   Jaynee Eagles, PA-C 04/29/19 1132

## 2019-04-29 NOTE — ED Notes (Signed)
Per family, pt has also been complaining of sore throat which started today. EDP notified.

## 2019-04-29 NOTE — ED Triage Notes (Signed)
C/o shortness of breath, sent from urgent care for evaluation,

## 2019-04-29 NOTE — Discharge Instructions (Addendum)
Please report to the Emergency Room at [facility] now as I am concerned that your breathing is compromised due to 91-93% oxygen saturation and terrible lung sounds.

## 2019-05-19 ENCOUNTER — Other Ambulatory Visit: Payer: Self-pay

## 2019-05-19 ENCOUNTER — Encounter: Payer: Self-pay | Admitting: Endocrinology

## 2019-05-19 ENCOUNTER — Ambulatory Visit (INDEPENDENT_AMBULATORY_CARE_PROVIDER_SITE_OTHER): Payer: Medicare Other | Admitting: Endocrinology

## 2019-05-19 VITALS — BP 130/70 | HR 76 | Ht 71.0 in | Wt 193.6 lb

## 2019-05-19 DIAGNOSIS — E0590 Thyrotoxicosis, unspecified without thyrotoxic crisis or storm: Secondary | ICD-10-CM | POA: Diagnosis not present

## 2019-05-19 LAB — TSH: TSH: 1.72 u[IU]/mL (ref 0.35–4.50)

## 2019-05-19 LAB — T4, FREE: Free T4: 0.78 ng/dL (ref 0.60–1.60)

## 2019-05-19 MED ORDER — METHIMAZOLE 5 MG PO TABS: 5.0000 mg | ORAL_TABLET | Freq: Every day | ORAL | 0 refills | Status: DC

## 2019-05-19 NOTE — Patient Instructions (Addendum)
Blood tests are requested for you today.  We'll let you know about the results.  If ever you have fever while taking methimazole, stop it and call us, even if the reason is obvious, because of the risk of a rare side-effect.  Please come back for a follow-up appointment in 4 months.

## 2019-05-19 NOTE — Progress Notes (Signed)
Subjective:    Patient ID: Darrell Lang, male    DOB: 09-08-1945, 74 y.o.   MRN: [id number]  HPI Pt returns for f/u of hyperthyroidism (dx'ed 2020; tapazole rx was chosen, as it is mild; He has never had thyroid imaging).  He takes tapazole as rx'ed.  pt states he feels well in general.     Past Medical History:  Diagnosis Date  . Anxiety and depression   . Atrial fibrillation with RVR ([place]) 02/08/2019  . COVID-19 virus infection 02/09/2019  . Diabetes ([place])   . Encounter for screening colonoscopy 07/28/2013  . Essential hypertension 02/09/2019  . Hyperlipidemia 02/09/2019  . Hypertension   . Hypertriglyceridemia     Past Surgical History:  Procedure Laterality Date  . APPENDECTOMY    . COLONOSCOPY  Sept 2005   Dr. Gala Romney: internal hemorrhoids, otherwise normal rectum. Normal colon and normal TI  . COLONOSCOPY N/A 08/06/2013   Procedure: COLONOSCOPY;  Surgeon: Daneil Dolin, MD;  [facility];  Service: Endoscopy;  Laterality: N/A;  10:15    Social History   Socioeconomic History  . Marital status: Married    Spouse name: Not on file  . Number of children: Not on file  . Years of education: Not on file  . Highest education level: Not on file  Occupational History  . Not on file  Tobacco Use  . Smoking status: Former Smoker    Packs/day: 0.00    Years: 26.00    Pack years: 0.00    Types: Cigarettes  . Smokeless tobacco: Never Used  . Tobacco comment: Quit smoking in 1986  Substance and Sexual Activity  . Alcohol use: No    Comment: history of ETOH abuse in remote past, none currently.   . Drug use: No  . Sexual activity: Not on file  Other Topics Concern  . Not on file  Social History Narrative  . Not on file   Social Determinants of Health   Financial Resource Strain:   . Difficulty of Paying Living Expenses: Not on file  Food Insecurity:   . Worried About Charity fundraiser in the Last Year: Not on file  . Ran Out of Food in the Last Year:  Not on file  Transportation Needs:   . Lack of Transportation (Medical): Not on file  . Lack of Transportation (Non-Medical): Not on file  Physical Activity:   . Days of Exercise per Week: Not on file  . Minutes of Exercise per Session: Not on file  Stress:   . Feeling of Stress : Not on file  Social Connections:   . Frequency of Communication with Friends and Family: Not on file  . Frequency of Social Gatherings with Friends and Family: Not on file  . Attends Religious Services: Not on file  . Active Member of Clubs or Organizations: Not on file  . Attends Archivist Meetings: Not on file  . Marital Status: Not on file  Intimate Partner Violence:   . Fear of Current or Ex-Partner: Not on file  . Emotionally Abused: Not on file  . Physically Abused: Not on file  . Sexually Abused: Not on file    Current Outpatient Medications on File Prior to Visit  Medication Sig Dispense Refill  . apixaban (ELIQUIS) 5 MG TABS tablet Take 1 tablet (5 mg total) by mouth 2 (two) times daily. 180 tablet 3  . benztropine (COGENTIN) 1 MG tablet Take 1 mg by mouth at  bedtime.    . citalopram (CELEXA) 20 MG tablet Take 20 mg by mouth daily.    . clonazePAM (KLONOPIN) 0.5 MG tablet Take 0.5 mg by mouth 2 (two) times daily.     . cloNIDine (CATAPRES) 0.1 MG tablet Take 0.1 mg by mouth daily.    . Cyanocobalamin (B-12 PO) Take 1 tablet by mouth daily.    . empagliflozin (JARDIANCE) 10 MG TABS tablet Take 10 mg by mouth daily.    Marland Kitchen glipiZIDE (GLUCOTROL) 5 MG tablet Take 10 mg by mouth 2 (two) times daily before a meal.    . losartan (COZAAR) 100 MG tablet Take 100 mg by mouth daily.    . metFORMIN (GLUCOPHAGE) 1000 MG tablet Take 1,000 mg by mouth 2 (two) times daily with a meal. 1000mg  AM; 1500mg  PM    . metoprolol tartrate (LOPRESSOR) 25 MG tablet Take 1 tablet (25 mg total) by mouth 2 (two) times daily. 60 tablet 0  . Multiple Vitamin (MULTIVITAMIN) tablet Take 1 tablet by mouth daily.    Marland Kitchen  omeprazole (PRILOSEC) 20 MG capsule Take 20 mg by mouth daily.    . QUEtiapine (SEROQUEL) 200 MG tablet Take 100 mg by mouth at bedtime.     . risperiDONE (RISPERDAL) 1 MG tablet Take 1 mg by mouth at bedtime.    . simvastatin (ZOCOR) 80 MG tablet Take 40 mg by mouth at bedtime.     . [DISCONTINUED] haloperidol (HALDOL) 2 MG tablet Take 2 mg by mouth every 8 (eight) hours as needed for agitation. Just takes one tablet at bedtime     No current facility-administered medications on file prior to visit.    No Known Allergies  Family History  Problem Relation Age of Onset  . Stroke Father   . Colon cancer Neg Hx   . Thyroid disease Neg Hx     BP 130/70 (BP Location: Left Arm, Patient Position: Sitting, Cuff Size: Normal)   Pulse 76   Ht 5\' 11"  (1.803 m)   Wt 193 lb 9.6 oz (87.8 kg)   SpO2 97%   BMI 27.00 kg/m    Review of Systems Denies fever    Objective:   Physical Exam VITAL SIGNS:  See vs page GENERAL: no distress NECK: There is no palpable thyroid enlargement.  No thyroid nodule is palpable.  No palpable lymphadenopathy at the anterior neck.   Lab Results  Component Value Date   TSH 1.72 05/19/2019   T3TOTAL 66 (L) 02/12/2019      Assessment & Plan:  Hyperthyroidism: well-controlled. Please continue the same medication AF: in this setting, he should have rx of even mild hyperthyroidism.  Patient Instructions  Blood tests are requested for you today.  We'll let you know about the results.  If ever you have fever while taking methimazole, stop it and call us, even if the reason is obvious, because of the risk of a rare side-effect.  Please come back for a follow-up appointment in 4 months.

## 2019-05-20 ENCOUNTER — Telehealth: Payer: Self-pay

## 2019-05-20 ENCOUNTER — Other Ambulatory Visit: Payer: Self-pay

## 2019-05-20 DIAGNOSIS — E0590 Thyrotoxicosis, unspecified without thyrotoxic crisis or storm: Secondary | ICD-10-CM

## 2019-05-20 MED ORDER — METHIMAZOLE 5 MG PO TABS: 5.0000 mg | ORAL_TABLET | Freq: Every day | ORAL | 0 refills | Status: DC

## 2019-05-20 NOTE — Telephone Encounter (Signed)
-----   Message from Renato Shin, MD sent at 05/19/2019  7:49 PM EST ----- Please fax methimazole refill to [facility], thanks.

## 2019-05-20 NOTE — Telephone Encounter (Signed)
Called pt to obtain fax # for [facility]. Provided the following fax # [phone]. Rx has been faxed as requested. Confirmation received.

## 2019-05-22 NOTE — Telephone Encounter (Signed)
Pt called to state that he did not believe [facility] received his Rx. Requested Rx be mailed to pt home address. Rx has been placed in outgoing mail today.

## 2019-05-23 ENCOUNTER — Encounter: Payer: Self-pay | Admitting: Cardiovascular Disease

## 2019-05-23 ENCOUNTER — Other Ambulatory Visit: Payer: Self-pay

## 2019-05-23 ENCOUNTER — Ambulatory Visit (INDEPENDENT_AMBULATORY_CARE_PROVIDER_SITE_OTHER): Payer: Medicare Other | Admitting: Cardiovascular Disease

## 2019-05-23 VITALS — BP 140/70 | HR 71 | Ht 71.0 in | Wt 199.0 lb

## 2019-05-23 DIAGNOSIS — I10 Essential (primary) hypertension: Secondary | ICD-10-CM

## 2019-05-23 DIAGNOSIS — I4891 Unspecified atrial fibrillation: Secondary | ICD-10-CM | POA: Diagnosis not present

## 2019-05-23 MED ORDER — AMLODIPINE BESYLATE 5 MG PO TABS: 5.0000 mg | ORAL_TABLET | Freq: Every day | ORAL | 3 refills | Status: DC

## 2019-05-23 NOTE — Progress Notes (Signed)
Cardiology Office Note:    Date:  05/23/2019   ID:  Darrell Lang, DOB [birth year], MRN [id number]  PCP:  System, Pcp Not In  Cardiologist:  Jerris Keltz  Electrophysiologist:  None   Referring MD: No ref. provider found   Chief Complaint  Patient presents with  . Atrial Fibrillation    February 24, 2019   Darrell Lang is a 74 y.o. male with a hx of HTN, HLD, DM2, anxiety / depression who was admitted recently with COVID 19 and atrial fib with RVR .  Was found to have hyperthyroidism at that time   We are asked to see him today for further management of his atrial fibrillation by Dr. Candiss Norse  Had new onset Afib with CHADS2VASC score of 3 ( age 74, HTN, DM)  Was found to have hyperthyroidism during hospitalization ,  Started on Methimazole during hospitalization    Both wife and patient had COVID  Has quarintined for > 14 days post covid .   HR feels basically normal at this point  Has some DOE with strenuous work .   Goes to the [place] for his primary MD  Echocardiogram performed September 27 reveals normal left ventricular systolic function.  He has mild left ventricular hypertrophy.  No significant valvular abnormalities.  May 23, 2019:  Darrell Lang is seen today for follow-up of his paroxysmal atrial fibrillation.  He was diagnosed with paroxysmal atrial fibrillation when he presented for hospitalization for a severe Covid infection.  Still eating salty foods  /  Soup  Is not exercising regularly .     Past Medical History:  Diagnosis Date  . Anxiety and depression   . Atrial fibrillation with RVR ([place]) 02/08/2019  . COVID-19 virus infection 02/09/2019  . Diabetes ([place])   . Encounter for screening colonoscopy 07/28/2013  . Essential hypertension 02/09/2019  . Hyperlipidemia 02/09/2019  . Hypertension   . Hypertriglyceridemia     Past Surgical History:  Procedure Laterality Date  . APPENDECTOMY    . COLONOSCOPY  Sept 2005   Dr. Gala Romney: internal hemorrhoids, otherwise  normal rectum. Normal colon and normal TI  . COLONOSCOPY N/A 08/06/2013   Procedure: COLONOSCOPY;  Surgeon: Daneil Dolin, MD;  [facility];  Service: Endoscopy;  Laterality: N/A;  10:15    Current Medications: Current Meds  Medication Sig  . apixaban (ELIQUIS) 5 MG TABS tablet Take 1 tablet (5 mg total) by mouth 2 (two) times daily.  . benztropine (COGENTIN) 1 MG tablet Take 1 mg by mouth at bedtime.  . citalopram (CELEXA) 20 MG tablet Take 20 mg by mouth daily.  . clonazePAM (KLONOPIN) 0.5 MG tablet Take 0.5 mg by mouth 2 (two) times daily.   . Cyanocobalamin (B-12 PO) Take 1 tablet by mouth daily.  . empagliflozin (JARDIANCE) 10 MG TABS tablet Take 10 mg by mouth daily.  Marland Kitchen glipiZIDE (GLUCOTROL) 5 MG tablet Take 10 mg by mouth 2 (two) times daily before a meal.  . losartan (COZAAR) 100 MG tablet Take 100 mg by mouth daily.  . metFORMIN (GLUCOPHAGE) 1000 MG tablet Take 1,000 mg by mouth 2 (two) times daily with a meal. 1000mg  AM; 1500mg  PM  . methimazole (TAPAZOLE) 5 MG tablet Take 1 tablet (5 mg total) by mouth daily.  . metoprolol tartrate (LOPRESSOR) 25 MG tablet Take 1 tablet (25 mg total) by mouth 2 (two) times daily.  . Multiple Vitamin (MULTIVITAMIN) tablet Take 1 tablet by mouth daily.  Marland Kitchen omeprazole (PRILOSEC) 20  MG capsule Take 20 mg by mouth daily.  . QUEtiapine (SEROQUEL) 200 MG tablet Take 100 mg by mouth at bedtime.   . risperiDONE (RISPERDAL) 1 MG tablet Take 1 mg by mouth at bedtime.  . simvastatin (ZOCOR) 80 MG tablet Take 40 mg by mouth at bedtime.   . [DISCONTINUED] cloNIDine (CATAPRES) 0.1 MG tablet Take 0.1 mg by mouth daily.     Allergies:   Patient has no known allergies.   Social History   Socioeconomic History  . Marital status: Married    Spouse name: Not on file  . Number of children: Not on file  . Years of education: Not on file  . Highest education level: Not on file  Occupational History  . Not on file  Tobacco Use  . Smoking status:  Former Smoker    Packs/day: 0.00    Years: 26.00    Pack years: 0.00    Types: Cigarettes  . Smokeless tobacco: Never Used  . Tobacco comment: Quit smoking in 1986  Substance and Sexual Activity  . Alcohol use: No    Comment: history of ETOH abuse in remote past, none currently.   . Drug use: No  . Sexual activity: Not on file  Other Topics Concern  . Not on file  Social History Narrative  . Not on file   Social Determinants of Health   Financial Resource Strain:   . Difficulty of Paying Living Expenses: Not on file  Food Insecurity:   . Worried About Charity fundraiser in the Last Year: Not on file  . Ran Out of Food in the Last Year: Not on file  Transportation Needs:   . Lack of Transportation (Medical): Not on file  . Lack of Transportation (Non-Medical): Not on file  Physical Activity:   . Days of Exercise per Week: Not on file  . Minutes of Exercise per Session: Not on file  Stress:   . Feeling of Stress : Not on file  Social Connections:   . Frequency of Communication with Friends and Family: Not on file  . Frequency of Social Gatherings with Friends and Family: Not on file  . Attends Religious Services: Not on file  . Active Member of Clubs or Organizations: Not on file  . Attends Archivist Meetings: Not on file  . Marital Status: Not on file     Family History: The patient's family history includes Stroke in his father. There is no history of Colon cancer or Thyroid disease.  ROS:   Please see the history of present illness.     All other systems reviewed and are negative.  EKGs/Labs/Other Studies Reviewed:    The following studies were reviewed today:   EKG: May 23, 2019: Normal sinus rhythm at 71 beats a minute nonspecific ST and T wave abnormalities.  Recent Labs: 02/13/2019: ALT 16; B Natriuretic Peptide 215.0; Magnesium 2.5 04/29/2019: BUN 18; Creatinine, Ser 1.44; Hemoglobin 13.3; Platelets 228; Potassium 4.0; Sodium 132 05/19/2019:  TSH 1.72  Recent Lipid Panel No results found for: CHOL, TRIG, HDL, CHOLHDL, VLDL, LDLCALC, LDLDIRECT  Physical Exam:    Physical Exam: Blood pressure 140/70, pulse 71, height 5\' 11"  (1.803 m), weight 199 lb (90.3 kg), SpO2 97 %.  GEN:   Elderly male,   HEENT: Normal NECK: No JVD; No carotid bruits LYMPHATICS: No lymphadenopathy CARDIAC: RRR , no murmurs, rubs, gallops RESPIRATORY:  Clear to auscultation without rales, wheezing or rhonchi  ABDOMEN: Soft, non-tender, non-distended MUSCULOSKELETAL:  No edema; No deformity  SKIN: Warm and dry NEUROLOGIC:  Alert and oriented x 3   ASSESSMENT:    1. Atrial fibrillation with RVR ([place])   2. Essential hypertension    PLAN:      1. Paroxysmal atrial fibrillation: He is not had any recurrent episodes of atrial fibrillation as far as he can tell.  Continue current dose of metoprolol.  Continue Eliquis.  2.  Hypertension: He still eats some salty foods.  Have advised him to decrease his salt intake.  He is on clonidine 0.1 mg once a day which I do not think is an effective medication for him.  He is on maximum dose losartan.  We will add amlodipine 5 mg a day.  I encouraged him to exercise on a regular basis.   Medication Adjustments/Labs and Tests Ordered: Current medicines are reviewed at length with the patient today.  Concerns regarding medicines are outlined above.  Orders Placed This Encounter  Procedures  . EKG 12-Lead cs   Meds ordered this encounter  Medications  . amLODipine (NORVASC) 5 MG tablet    Sig: Take 1 tablet (5 mg total) by mouth daily.    Dispense:  90 tablet    Refill:  3    Patient Instructions  Medication Instructions:  Your physician has recommended you make the following change in your medication:  START Amlodipine (Norvasc) 5 mg once daily  *If you need a refill on your cardiac medications before your next appointment, please call your pharmacy*  Lab Work: None  Ordered   Testing/Procedures: None Ordered   Follow-Up: At [facility], you and your health needs are our priority.  As part of our continuing mission to provide you with exceptional heart care, we have created designated Provider Care Teams.  These Care Teams include your primary Cardiologist (physician) and Advanced Practice Providers (APPs -  Physician Assistants and Nurse Practitioners) who all work together to provide you with the care you need, when you need it.  Your next appointment:   6 month(s)  The format for your next appointment:   In Person  Provider:   Richardson Dopp, PA-C, Robbie Lis, PA-C or Daune Perch, NP       Signed, Mertie Moores, MD  05/23/2019 12:06 PM    [place]

## 2019-05-23 NOTE — Patient Instructions (Addendum)
Medication Instructions:  Your physician has recommended you make the following change in your medication:   STOP Clonidine (Catapres) START Amlodipine (Norvasc) 5 mg once daily in the morning for your high blood pressure  *If you need a refill on your cardiac medications before your next appointment, please call your pharmacy*  Lab Work: None Ordered   Testing/Procedures: None Ordered   Follow-Up: At [facility], you and your health needs are our priority.  As part of our continuing mission to provide you with exceptional heart care, we have created designated Provider Care Teams.  These Care Teams include your primary Cardiologist (physician) and Advanced Practice Providers (APPs -  Physician Assistants and Nurse Practitioners) who all work together to provide you with the care you need, when you need it.  Your next appointment:   6 month(s)  The format for your next appointment:   In Person  Provider:   Richardson Dopp, PA-C, Robbie Lis, PA-C or Daune Perch, NP

## 2019-07-01 ENCOUNTER — Other Ambulatory Visit: Payer: Self-pay

## 2019-07-01 DIAGNOSIS — E0590 Thyrotoxicosis, unspecified without thyrotoxic crisis or storm: Secondary | ICD-10-CM

## 2019-07-01 MED ORDER — METHIMAZOLE 5 MG PO TABS: 5.0000 mg | ORAL_TABLET | Freq: Every day | ORAL | 0 refills | Status: AC

## 2019-07-02 ENCOUNTER — Telehealth: Payer: Self-pay | Admitting: Endocrinology

## 2019-07-02 NOTE — Telephone Encounter (Signed)
Chart Review Routing History  Recipients Sent On Sent By Routed [facility]     07/02/2019  3:52 PM Aleatha Borer, LPN Office Visit on [id number] with Renato Shin, MD      Cover Page Message : [facility] 03/11/19. No other office notes to provide       [place]     07/02/2019  3:51 PM Aleatha Borer, LPN Office Visit on [id number] with Renato Shin, MD      Cover Page Message : Recent office notes       All office notes have been faxed as requested. Please refer to above routing history

## 2019-07-02 NOTE — Telephone Encounter (Signed)
Christy from the [facility] requests to be faxed office notes from patient's visit when patient was put on methimazole (TAPAZOLE) 5 MG tablet to Fax# [phone] so that they can order the medication. Without the notes from patient's visit, patient will not receive the medication.

## 2019-08-12 ENCOUNTER — Other Ambulatory Visit: Payer: Self-pay | Admitting: *Deleted

## 2019-08-12 ENCOUNTER — Other Ambulatory Visit: Payer: Self-pay

## 2019-08-12 MED ORDER — AMLODIPINE BESYLATE 5 MG PO TABS: 5.0000 mg | ORAL_TABLET | Freq: Every day | ORAL | 2 refills | Status: DC

## 2019-08-12 NOTE — Telephone Encounter (Signed)
Script faxed to [facility] as requested ./cy

## 2019-08-12 NOTE — Telephone Encounter (Signed)
Pt calling requesting a refill on amlodipine. Pt states that he needs it to be faxed to the [place] in [place] at [phone], ATTN: Bethena Roys. Pt states that he is almost out of medication. Please address

## 2019-08-12 NOTE — Addendum Note (Signed)
Addended by: Derl Barrow on: 08/12/2019 12:56 PM   Modules accepted: Orders

## 2019-09-15 ENCOUNTER — Other Ambulatory Visit: Payer: Self-pay

## 2019-09-17 ENCOUNTER — Other Ambulatory Visit: Payer: Self-pay

## 2019-09-17 ENCOUNTER — Ambulatory Visit (INDEPENDENT_AMBULATORY_CARE_PROVIDER_SITE_OTHER): Payer: Medicare Other | Admitting: Endocrinology

## 2019-09-17 ENCOUNTER — Encounter: Payer: Self-pay | Admitting: Endocrinology

## 2019-09-17 VITALS — BP 128/70 | HR 75 | Ht 71.0 in | Wt 203.0 lb

## 2019-09-17 DIAGNOSIS — E1165 Type 2 diabetes mellitus with hyperglycemia: Secondary | ICD-10-CM

## 2019-09-17 NOTE — Patient Instructions (Addendum)
Blood tests are requested for you today.  We'll let you know about the results.  If ever you have fever while taking methimazole, stop it and call us, even if the reason is obvious, because of the risk of a rare side-effect.  It is best to never miss the medication.  However, if you do miss it, next best is to double up the next time.   Please come back for a follow-up appointment in 5 months.

## 2019-09-17 NOTE — Progress Notes (Signed)
Subjective:    Patient ID: Darrell Lang, male    DOB: 1946/05/10, 74 y.o.   MRN: [id number]  HPI Pt returns for f/u of hyperthyroidism (dx'ed 2020; tapazole rx was chosen, as it is mild; He has never had thyroid imaging).  He takes tapazole as rx'ed.  pt states he feels well in general. Specifically, he denies palpitations and tremor.   Past Medical History:  Diagnosis Date  . Anxiety and depression   . Atrial fibrillation with RVR ([place]) 02/08/2019  . COVID-19 virus infection 02/09/2019  . Diabetes ([place])   . Encounter for screening colonoscopy 07/28/2013  . Essential hypertension 02/09/2019  . Hyperlipidemia 02/09/2019  . Hypertension   . Hypertriglyceridemia     Past Surgical History:  Procedure Laterality Date  . APPENDECTOMY    . COLONOSCOPY  Sept 2005   Dr. Gala Romney: internal hemorrhoids, otherwise normal rectum. Normal colon and normal TI  . COLONOSCOPY N/A 08/06/2013   Procedure: COLONOSCOPY;  Surgeon: Daneil Dolin, MD;  [facility];  Service: Endoscopy;  Laterality: N/A;  10:15    Social History   Socioeconomic History  . Marital status: Married    Spouse name: Not on file  . Number of children: Not on file  . Years of education: Not on file  . Highest education level: Not on file  Occupational History  . Not on file  Tobacco Use  . Smoking status: Former Smoker    Packs/day: 0.00    Years: 26.00    Pack years: 0.00    Types: Cigarettes  . Smokeless tobacco: Never Used  . Tobacco comment: Quit smoking in 1986  Substance and Sexual Activity  . Alcohol use: No    Comment: history of ETOH abuse in remote past, none currently.   . Drug use: No  . Sexual activity: Not on file  Other Topics Concern  . Not on file  Social History Narrative  . Not on file   Social Determinants of Health   Financial Resource Strain:   . Difficulty of Paying Living Expenses:   Food Insecurity:   . Worried About Charity fundraiser in the Last Year:   . Academic librarian in the Last Year:   Transportation Needs:   . Film/video editor (Medical):   Marland Kitchen Lack of Transportation (Non-Medical):   Physical Activity:   . Days of Exercise per Week:   . Minutes of Exercise per Session:   Stress:   . Feeling of Stress :   Social Connections:   . Frequency of Communication with Friends and Family:   . Frequency of Social Gatherings with Friends and Family:   . Attends Religious Services:   . Active Member of Clubs or Organizations:   . Attends Archivist Meetings:   Marland Kitchen Marital Status:   Intimate Partner Violence:   . Fear of Current or Ex-Partner:   . Emotionally Abused:   Marland Kitchen Physically Abused:   . Sexually Abused:     Current Outpatient Medications on File Prior to Visit  Medication Sig Dispense Refill  . amLODipine (NORVASC) 5 MG tablet Take 1 tablet (5 mg total) by mouth daily. 90 tablet 2  . apixaban (ELIQUIS) 5 MG TABS tablet Take 1 tablet (5 mg total) by mouth 2 (two) times daily. 180 tablet 3  . benztropine (COGENTIN) 1 MG tablet Take 1 mg by mouth at bedtime.    . citalopram (CELEXA) 20 MG tablet Take 20 mg  by mouth daily.    . clonazePAM (KLONOPIN) 0.5 MG tablet Take 0.5 mg by mouth 2 (two) times daily.     . Cyanocobalamin (B-12 PO) Take 1 tablet by mouth daily.    . empagliflozin (JARDIANCE) 10 MG TABS tablet Take 10 mg by mouth daily.    Marland Kitchen glipiZIDE (GLUCOTROL) 5 MG tablet Take 10 mg by mouth 2 (two) times daily before a meal.    . losartan (COZAAR) 100 MG tablet Take 100 mg by mouth daily.    . metFORMIN (GLUCOPHAGE) 1000 MG tablet Take 1,000 mg by mouth 2 (two) times daily with a meal. 1000mg  AM; 1500mg  PM    . methimazole (TAPAZOLE) 5 MG tablet Take 1 tablet (5 mg total) by mouth daily. Hyperthyroidism: E05.90 90 tablet 0  . metoprolol tartrate (LOPRESSOR) 25 MG tablet Take 1 tablet (25 mg total) by mouth 2 (two) times daily. 60 tablet 0  . Multiple Vitamin (MULTIVITAMIN) tablet Take 1 tablet by mouth daily.    Marland Kitchen omeprazole  (PRILOSEC) 20 MG capsule Take 20 mg by mouth daily.    . QUEtiapine (SEROQUEL) 200 MG tablet Take 100 mg by mouth at bedtime.     . risperiDONE (RISPERDAL) 1 MG tablet Take 1 mg by mouth at bedtime.    . simvastatin (ZOCOR) 80 MG tablet Take 40 mg by mouth at bedtime.     . [DISCONTINUED] haloperidol (HALDOL) 2 MG tablet Take 2 mg by mouth every 8 (eight) hours as needed for agitation. Just takes one tablet at bedtime     No current facility-administered medications on file prior to visit.    No Known Allergies  Family History  Problem Relation Age of Onset  . Stroke Father   . Colon cancer Neg Hx   . Thyroid disease Neg Hx     BP 128/70   Pulse 75   Ht 5\' 11"  (1.803 m)   Wt 203 lb (92.1 kg)   SpO2 97%   BMI 28.31 kg/m    Review of Systems Denies fever    Objective:   Physical Exam VITAL SIGNS:  See vs page GENERAL: no distress NECK: There is no palpable thyroid enlargement.  No thyroid nodule is palpable.  No palpable lymphadenopathy at the anterior neck.        Assessment & Plan:  Hyperthyroidism: due for recheck. Please continue the same medication.    Patient Instructions  Blood tests are requested for you today.  We'll let you know about the results.  If ever you have fever while taking methimazole, stop it and call us, even if the reason is obvious, because of the risk of a rare side-effect.  It is best to never miss the medication.  However, if you do miss it, next best is to double up the next time.   Please come back for a follow-up appointment in 5 months.

## 2019-11-23 ENCOUNTER — Encounter: Payer: Self-pay | Admitting: Cardiovascular Disease

## 2019-11-23 NOTE — Progress Notes (Signed)
Cardiology Office Note:    Date:  11/24/2019   ID:  Darrell Lang, DOB 1945/09/16, MRN [id number]  PCP:  System, Pcp Not In  Cardiologist:  Ahniyah Giancola  Electrophysiologist:  None   Referring MD: No ref. provider found   Chief Complaint  Patient presents with  . Atrial Fibrillation    February 24, 2019   [name] is a 74 y.o. male with a hx of HTN, HLD, DM2, anxiety / depression who was admitted recently with COVID 19 and atrial fib with RVR .  Was found to have hyperthyroidism at that time   We are asked to see him today for further management of his atrial fibrillation by Dr. Candiss Norse  Had new onset Afib with CHADS2VASC score of 3 ( age 82, HTN, DM)  Was found to have hyperthyroidism during hospitalization ,  Started on Methimazole during hospitalization    Both wife and patient had COVID  Has quarintined for > 14 days post covid .   HR feels basically normal at this point  Has some DOE with strenuous work .   Goes to the [place] for his primary MD  Echocardiogram performed September 27 reveals normal left ventricular systolic function.  He has mild left ventricular hypertrophy.  No significant valvular abnormalities.  May 23, 2019:  Darrell Lang is seen today for follow-up of his paroxysmal atrial fibrillation.  He was diagnosed with paroxysmal atrial fibrillation when he presented for hospitalization for a severe Covid infection.  Still eating salty foods  /  Soup  Is not exercising regularly .     November 24, 2019:  Darrell Lang is seen back today for follow-up of his paroxysmal atrial fibrillation.  He was found to have A. fib when he presented with a severe Covid infection.  Blood pressure has been elevated at times. No CP or dyspnea    Past Medical History:  Diagnosis Date  . Anxiety and depression   . Atrial fibrillation with RVR ([place]) 02/08/2019  . COVID-19 virus infection 02/09/2019  . Diabetes ([place])   . Encounter for screening colonoscopy 07/28/2013  . Essential  hypertension 02/09/2019  . Hyperlipidemia 02/09/2019  . Hypertension   . Hypertriglyceridemia     Past Surgical History:  Procedure Laterality Date  . APPENDECTOMY    . COLONOSCOPY  Sept 2005   Dr. Gala Romney: internal hemorrhoids, otherwise normal rectum. Normal colon and normal TI  . COLONOSCOPY N/A 08/06/2013   Procedure: COLONOSCOPY;  Surgeon: Daneil Dolin, MD;  [facility];  Service: Endoscopy;  Laterality: N/A;  10:15    Current Medications: Current Meds  Medication Sig  . amLODipine (NORVASC) 5 MG tablet Take 1 tablet (5 mg total) by mouth daily.  Marland Kitchen apixaban (ELIQUIS) 5 MG TABS tablet Take 1 tablet (5 mg total) by mouth 2 (two) times daily.  . benztropine (COGENTIN) 1 MG tablet Take 1 mg by mouth at bedtime.  . citalopram (CELEXA) 20 MG tablet Take 20 mg by mouth daily.  . clonazePAM (KLONOPIN) 0.5 MG tablet Take 0.5 mg by mouth 2 (two) times daily.   . Cyanocobalamin (B-12 PO) Take 1 tablet by mouth daily.  . empagliflozin (JARDIANCE) 10 MG TABS tablet Take 10 mg by mouth daily.  Marland Kitchen glipiZIDE (GLUCOTROL) 5 MG tablet Take 10 mg by mouth 2 (two) times daily before a meal.  . losartan (COZAAR) 100 MG tablet Take 100 mg by mouth daily.  . metFORMIN (GLUCOPHAGE) 1000 MG tablet Take 1,000 mg by mouth 2 (  two) times daily with a meal. 1000mg  AM; 1500mg  PM  . methimazole (TAPAZOLE) 5 MG tablet Take 1 tablet (5 mg total) by mouth daily. Hyperthyroidism: E05.90  . metoprolol tartrate (LOPRESSOR) 25 MG tablet Take 1 tablet (25 mg total) by mouth 2 (two) times daily.  . Multiple Vitamin (MULTIVITAMIN) tablet Take 1 tablet by mouth daily.  Marland Kitchen omeprazole (PRILOSEC) 20 MG capsule Take 20 mg by mouth daily.  . QUEtiapine (SEROQUEL) 200 MG tablet Take 100 mg by mouth at bedtime.   . risperiDONE (RISPERDAL) 1 MG tablet Take 1 mg by mouth at bedtime.  . simvastatin (ZOCOR) 80 MG tablet Take 40 mg by mouth at bedtime.   . tamsulosin (FLOMAX) 0.4 MG CAPS capsule Take 0.8 mg by mouth at  bedtime.     Allergies:   Patient has no known allergies.   Social History   Socioeconomic History  . Marital status: Married    Spouse name: Not on file  . Number of children: Not on file  . Years of education: Not on file  . Highest education level: Not on file  Occupational History  . Not on file  Tobacco Use  . Smoking status: Former Smoker    Packs/day: 0.00    Years: 26.00    Pack years: 0.00    Types: Cigarettes  . Smokeless tobacco: Never Used  . Tobacco comment: Quit smoking in 1986  Substance and Sexual Activity  . Alcohol use: No    Comment: history of ETOH abuse in remote past, none currently.   . Drug use: No  . Sexual activity: Not on file  Other Topics Concern  . Not on file  Social History Narrative  . Not on file   Social Determinants of Health   Financial Resource Strain:   . Difficulty of Paying Living Expenses:   Food Insecurity:   . Worried About Charity fundraiser in the Last Year:   . Arboriculturist in the Last Year:   Transportation Needs:   . Film/video editor (Medical):   Marland Kitchen Lack of Transportation (Non-Medical):   Physical Activity:   . Days of Exercise per Week:   . Minutes of Exercise per Session:   Stress:   . Feeling of Stress :   Social Connections:   . Frequency of Communication with Friends and Family:   . Frequency of Social Gatherings with Friends and Family:   . Attends Religious Services:   . Active Member of Clubs or Organizations:   . Attends Archivist Meetings:   Marland Kitchen Marital Status:      Family History: The patient's family history includes Stroke in his father. There is no history of Colon cancer or Thyroid disease.  ROS:   Please see the history of present illness.     All other systems reviewed and are negative.  EKGs/Labs/Other Studies Reviewed:    The following studies were reviewed today:   EKG: May 23, 2019: Normal sinus rhythm at 71 beats a minute nonspecific ST and T wave  abnormalities.  Recent Labs: 02/13/2019: ALT 16; B Natriuretic Peptide 215.0; Magnesium 2.5 04/29/2019: BUN 18; Creatinine, Ser 1.44; Hemoglobin 13.3; Platelets 228; Potassium 4.0; Sodium 132 05/19/2019: TSH 1.72  Recent Lipid Panel No results found for: CHOL, TRIG, HDL, CHOLHDL, VLDL, LDLCALC, LDLDIRECT  Physical Exam:    Physical Exam: Blood pressure 132/72, pulse 60, height 5\' 11"  (1.803 m), weight 197 lb 3.2 oz (89.4 kg), SpO2 97 %.  GEN:  Well nourished, well developed in no acute distress HEENT: Normal NECK: No JVD; No carotid bruits LYMPHATICS: No lymphadenopathy CARDIAC: RRR , no murmurs, rubs, gallops RESPIRATORY:  Clear to auscultation without rales, wheezing or rhonchi  ABDOMEN: Soft, non-tender, non-distended MUSCULOSKELETAL:  No edema; No deformity  SKIN: Warm and dry NEUROLOGIC:  Alert and oriented x 3    ASSESSMENT:    No diagnosis found. PLAN:      1.  Paroxysmal atrial fibrillation:   No recent episodes of AF.  Cont eliquis .   2.  Hypertension:  BP is well controlled.   Medication Adjustments/Labs and Tests Ordered: Current medicines are reviewed at length with the patient today.  Concerns regarding medicines are outlined above.  No orders of the defined types were placed in this encounter.  No orders of the defined types were placed in this encounter.   Patient Instructions  Medication Instructions:  Your physician recommends that you continue on your current medications as directed. Please refer to the Current Medication list given to you today.  *If you need a refill on your cardiac medications before your next appointment, please call your pharmacy*   Lab Work: None Ordered If you have labs (blood work) drawn today and your tests are completely normal, you will receive your results only by: Marland Kitchen MyChart Message (if you have MyChart) OR . A paper copy in the mail If you have any lab test that is abnormal or we need to change your treatment, we  will call you to review the results.   Testing/Procedures: None Ordered   Follow-Up: At [facility], you and your health needs are our priority.  As part of our continuing mission to provide you with exceptional heart care, we have created designated Provider Care Teams.  These Care Teams include your primary Cardiologist (physician) and Advanced Practice Providers (APPs -  Physician Assistants and Nurse Practitioners) who all work together to provide you with the care you need, when you need it.  We recommend signing up for the patient portal called "MyChart".  Sign up information is provided on this After Visit Summary.  MyChart is used to connect with patients for Virtual Visits (Telemedicine).  Patients are able to view lab/test results, encounter notes, upcoming appointments, etc.  Non-urgent messages can be sent to your provider as well.   To learn more about what you can do with MyChart, go to [email or web address].    Your next appointment:   1 year(s)  The format for your next appointment:   In Person  Provider:   Richardson Dopp, PA-C       Signed, Mertie Moores, MD  11/24/2019 11:25 AM    [place]

## 2019-11-24 ENCOUNTER — Other Ambulatory Visit: Payer: Self-pay

## 2019-11-24 ENCOUNTER — Encounter: Payer: Self-pay | Admitting: Cardiovascular Disease

## 2019-11-24 ENCOUNTER — Ambulatory Visit (INDEPENDENT_AMBULATORY_CARE_PROVIDER_SITE_OTHER): Payer: Medicare Other | Admitting: Cardiovascular Disease

## 2019-11-24 VITALS — BP 132/72 | HR 60 | Ht 71.0 in | Wt 197.2 lb

## 2019-11-24 DIAGNOSIS — I10 Essential (primary) hypertension: Secondary | ICD-10-CM

## 2019-11-24 DIAGNOSIS — I4891 Unspecified atrial fibrillation: Secondary | ICD-10-CM | POA: Diagnosis not present

## 2019-11-24 NOTE — Patient Instructions (Signed)
Medication Instructions:  Your physician recommends that you continue on your current medications as directed. Please refer to the Current Medication list given to you today.  *If you need a refill on your cardiac medications before your next appointment, please call your pharmacy*   Lab Work: None Ordered If you have labs (blood work) drawn today and your tests are completely normal, you will receive your results only by: Marland Kitchen MyChart Message (if you have MyChart) OR . A paper copy in the mail If you have any lab test that is abnormal or we need to change your treatment, we will call you to review the results.   Testing/Procedures: None Ordered   Follow-Up: At [facility], you and your health needs are our priority.  As part of our continuing mission to provide you with exceptional heart care, we have created designated Provider Care Teams.  These Care Teams include your primary Cardiologist (physician) and Advanced Practice Providers (APPs -  Physician Assistants and Nurse Practitioners) who all work together to provide you with the care you need, when you need it.  We recommend signing up for the patient portal called "MyChart".  Sign up information is provided on this After Visit Summary.  MyChart is used to connect with patients for Virtual Visits (Telemedicine).  Patients are able to view lab/test results, encounter notes, upcoming appointments, etc.  Non-urgent messages can be sent to your provider as well.   To learn more about what you can do with MyChart, go to [email or web address].    Your next appointment:   1 year(s)  The format for your next appointment:   In Person  Provider:   Richardson Dopp, PA-C

## 2020-04-27 ENCOUNTER — Telehealth: Payer: Self-pay | Admitting: Cardiovascular Disease

## 2020-04-27 NOTE — Telephone Encounter (Signed)
Patient with diagnosis of afib on Eliquis for anticoagulation.    Procedure:  Urolift  Date of procedure: TBD    CHA2DS2-VASc Score = 3  This indicates a 3.2% annual risk of stroke. The patient's score is based upon: CHF History: No HTN History: Yes Diabetes History: Yes Stroke History: No Vascular Disease History: No Age Score: 1 Gender Score: 0     CrCl 49 ml/min  Per office protocol, patient can hold Eliquis for 3 days prior to procedure.

## 2020-04-27 NOTE — Telephone Encounter (Signed)
   [facility] Pre-operative Risk Assessment    HEARTCARE STAFF: - Please ensure there is not already an duplicate clearance open for this procedure. - Under Visit Info/Reason for Call, type in Other and utilize the format Clearance MM/DD/YY or Clearance TBD. Do not use dashes or single digits. - If request is for dental extraction, please clarify the # of teeth to be extracted.  Request for surgical clearance:  1. What type of surgery is being performed? Urolift   2. When is this surgery scheduled? TBD   3. What type of clearance is required (medical clearance vs. Pharmacy clearance to hold med vs. Both)? Both   4. Are there any medications that need to be held prior to surgery and how long? Eliquis 3 days prior   5. Practice name and name of physician performing surgery? [facility], Dr. Festus Aloe  6. What is the office phone number? [phone]   7.   What is the office fax number? [phone]  8.   Anesthesia type (None, local, MAC, general) ? Nitrous oxide    Trilby Drummer 04/27/2020, 9:47 AM  _________________________________________________________________   (provider comments below)

## 2020-04-27 NOTE — Telephone Encounter (Signed)
Please address Eliquis for urolift thanks

## 2020-04-28 NOTE — Telephone Encounter (Signed)
   Primary Cardiologist: No primary care provider on file.  Chart reviewed as part of pre-operative protocol coverage. Patient was contacted 04/28/2020 in reference to pre-operative risk assessment for pending surgery as outlined below.  Darrell Lang was last seen on 11/24/19 by Dr. Acie Fredrickson.  Since that day, Darrell Lang has done well.  He does not have a history of MI or PCI. He is able to complete 4.0 METS without angina.   Per our clinical pharmacist: Patient with diagnosis of afib on Eliquis for anticoagulation.    Procedure: Urolift Date of procedure: TBD    CHA2DS2-VASc Score = 3  This indicates a 3.2% annual risk of stroke. The patient's score is based upon: CHF History: No HTN History: Yes Diabetes History: Yes Stroke History: No Vascular Disease History: No Age Score: 1 Gender Score: 0     CrCl 49 ml/min  Per office protocol, patient can hold Eliquis for 3 days prior to procedure.    Therefore, based on ACC/AHA guidelines, the patient would be at acceptable risk for the planned procedure without further cardiovascular testing.   The patient was advised that if he develops new symptoms prior to surgery to contact our office to arrange for a follow-up visit, and he verbalized understanding.  I will route this recommendation to the requesting party via Epic fax function and remove from pre-op pool. Please call with questions.  Tami Lin Adynn Caseres, PA 04/28/2020, 8:57 AM

## 2020-06-30 ENCOUNTER — Encounter: Payer: Self-pay | Admitting: Cardiovascular Disease

## 2020-06-30 NOTE — Telephone Encounter (Signed)
error 

## 2020-10-12 ENCOUNTER — Ambulatory Visit
Admission: EM | Admit: 2020-10-12 | Discharge: 2020-10-12 | Disposition: A | Discharge status: Home / Self Care | Payer: Medicare Other | Attending: Family Medicine | Admitting: Family Medicine

## 2020-10-12 DIAGNOSIS — L03032 Cellulitis of left toe: Secondary | ICD-10-CM | POA: Diagnosis not present

## 2020-10-12 DIAGNOSIS — S99922A Unspecified injury of left foot, initial encounter: Secondary | ICD-10-CM

## 2020-10-12 MED ORDER — SULFAMETHOXAZOLE-TRIMETHOPRIM 800-160 MG PO TABS: 1.0000 | ORAL_TABLET | Freq: Two times a day (BID) | ORAL | 0 refills | Status: AC

## 2020-10-12 NOTE — ED Triage Notes (Signed)
Pt states that he hit his great toe and the nail is split.

## 2020-10-12 NOTE — Discharge Instructions (Signed)
I have sent in Bactrim for you to take twice a day for 7 days.  Follow up with this office or with primary care if symptoms are persisting.  Follow up in the ER for high fever, trouble swallowing, trouble breathing, other concerning symptoms.

## 2020-10-12 NOTE — ED Provider Notes (Signed)
[facility]    CSN: [id number] Arrival date & time: 10/12/20  1405      History   Chief Complaint Chief Complaint  Patient presents with  . Toe Injury    Pt states that he injured his left great toe. X1 week    HPI Darrell Lang is a 75 y.o. male.   Reports that he has had a left great toenail infection for the last few days. Has been doing epsom salt soaks at home. There are not aggravating or alleviating factors. Reports that he hit his toenail this morning and now the entire nail is split top to bottom. Denies chills, abdominal pain, body aches, headache, fever, rash, other symptoms.   ROS per HPI  The history is provided by the patient.    Past Medical History:  Diagnosis Date  . Anxiety and depression   . Atrial fibrillation with RVR ([place]) 02/08/2019  . COVID-19 virus infection 02/09/2019  . Diabetes ([place])   . Encounter for screening colonoscopy 07/28/2013  . Essential hypertension 02/09/2019  . Hyperlipidemia 02/09/2019  . Hypertension   . Hypertriglyceridemia     Patient Active Problem List   Diagnosis Date Noted  . Hyperthyroidism 03/11/2019  . COVID-19 virus infection 02/09/2019  . Essential hypertension 02/09/2019  . Hyperlipidemia 02/09/2019  . Diabetes ([place]) 02/09/2019  . Atrial fibrillation with RVR ([place]) 02/08/2019  . Encounter for screening colonoscopy 07/28/2013    Past Surgical History:  Procedure Laterality Date  . APPENDECTOMY    . COLONOSCOPY  Sept 2005   Dr. Gala Romney: internal hemorrhoids, otherwise normal rectum. Normal colon and normal TI  . COLONOSCOPY N/A 08/06/2013   Procedure: COLONOSCOPY;  Surgeon: Daneil Dolin, MD;  Location: [facility];  Service: Endoscopy;  Laterality: N/A;  10:15       Home Medications    Prior to Admission medications   Medication Sig Start Date End Date Taking? Authorizing Provider  amLODipine (NORVASC) 5 MG tablet Take 1 tablet (5 mg total) by mouth daily. 08/12/19  Yes Weaver, Scott T,  PA-C  apixaban (ELIQUIS) 5 MG TABS tablet Take 1 tablet (5 mg total) by mouth 2 (two) times daily. 02/24/19  Yes Nahser, Wonda Cheng, MD  benztropine (COGENTIN) 1 MG tablet Take 1 mg by mouth at bedtime.   Yes [provider]  citalopram (CELEXA) 20 MG tablet Take 20 mg by mouth daily.   Yes [provider]  clonazePAM (KLONOPIN) 0.5 MG tablet Take 0.5 mg by mouth 2 (two) times daily.    Yes [provider]  Cyanocobalamin (B-12 PO) Take 1 tablet by mouth daily.   Yes [provider]  empagliflozin (JARDIANCE) 10 MG TABS tablet Take 10 mg by mouth daily.   Yes [provider]  glipiZIDE (GLUCOTROL) 5 MG tablet Take 10 mg by mouth 2 (two) times daily before a meal.   Yes [provider]  losartan (COZAAR) 100 MG tablet Take 100 mg by mouth daily.   Yes [provider]  metFORMIN (GLUCOPHAGE) 1000 MG tablet Take 1,000 mg by mouth 2 (two) times daily with a meal. 1000mg  AM; 1500mg  PM   Yes [provider]  methimazole (TAPAZOLE) 5 MG tablet Take 1 tablet (5 mg total) by mouth daily. Hyperthyroidism: E05.90 07/01/19  Yes Renato Shin, MD  metoprolol tartrate (LOPRESSOR) 25 MG tablet Take 1 tablet (25 mg total) by mouth 2 (two) times daily. 03/06/19  Yes Nahser, Wonda Cheng, MD  Multiple Vitamin (MULTIVITAMIN) tablet  Take 1 tablet by mouth daily.   Yes [provider]  omeprazole (PRILOSEC) 20 MG capsule Take 20 mg by mouth daily.   Yes [provider]  QUEtiapine (SEROQUEL) 200 MG tablet Take 100 mg by mouth at bedtime.    Yes [provider]  risperiDONE (RISPERDAL) 1 MG tablet Take 1 mg by mouth at bedtime.   Yes [provider]  simvastatin (ZOCOR) 80 MG tablet Take 40 mg by mouth at bedtime.    Yes [provider]  sulfamethoxazole-trimethoprim (BACTRIM DS) 800-160 MG tablet Take 1 tablet by mouth 2 (two) times daily for 7 days. 10/12/20 10/19/20 Yes Faustino Congress, NP  tamsulosin  (FLOMAX) 0.4 MG CAPS capsule Take 0.8 mg by mouth at bedtime. 10/28/19  Yes [provider]  haloperidol (HALDOL) 2 MG tablet Take 2 mg by mouth every 8 (eight) hours as needed for agitation. Just takes one tablet at bedtime  01/13/19  [provider]    Family History Family History  Problem Relation Age of Onset  . Stroke Father   . Colon cancer Neg Hx   . Thyroid disease Neg Hx     Social History Social History   Tobacco Use  . Smoking status: Former Smoker    Packs/day: 0.00    Years: 26.00    Pack years: 0.00    Types: Cigarettes  . Smokeless tobacco: Never Used  . Tobacco comment: Quit smoking in 1986  Substance Use Topics  . Alcohol use: No    Comment: history of ETOH abuse in remote past, none currently.   . Drug use: No     Allergies   Patient has no known allergies.   Review of Systems Review of Systems   Physical Exam Triage Vital Signs ED Triage Vitals  Enc Vitals Group     BP 10/12/20 1543 (!) 126/41     Pulse Rate 10/12/20 1543 68     Resp --      Temp 10/12/20 1543 98 F (36.7 C)     Temp Source 10/12/20 1543 Oral     SpO2 10/12/20 1543 97 %     Weight 10/12/20 1540 194 lb (88 kg)     Height 10/12/20 1540 5\' 11"  (1.803 m)     Head Circumference --      Peak Flow --      Pain Score 10/12/20 1540 5     Pain Loc --      Pain Edu? --      Excl. in [place]? --    No data found.  Updated Vital Signs BP (!) 126/41 (BP Location: Right Arm)   Pulse 68   Temp 98 F (36.7 C) (Oral)   Ht 5\' 11"  (1.803 m)   Wt 194 lb (88 kg)   SpO2 97%   BMI 27.06 kg/m   Visual Acuity Right Eye Distance:   Left Eye Distance:   Bilateral Distance:    Right Eye Near:   Left Eye Near:    Bilateral Near:     Physical Exam Vitals and nursing note reviewed.  Constitutional:      General: He is not in acute distress.    Appearance: Normal appearance. He is well-developed. He is not ill-appearing.  HENT:     Head: Normocephalic and atraumatic.      Nose: Nose normal.     Mouth/Throat:     Mouth: Mucous membranes are moist.     Pharynx: Oropharynx is clear.  Eyes:     Extraocular Movements: Extraocular movements intact.     Conjunctiva/sclera: Conjunctivae normal.     Pupils: Pupils are equal, round, and reactive to light.  Cardiovascular:     Rate and Rhythm: Normal rate and regular rhythm.  Pulmonary:     Effort: Pulmonary effort is normal. No respiratory distress.  Musculoskeletal:        General: Normal range of motion.     Cervical back: Normal range of motion and neck supple.  Skin:    General: Skin is warm and dry.     Capillary Refill: Capillary refill takes less than 2 seconds.     Comments: Skin around left great toenail erythematous, warm and tender to touch. Honey colored crust from medial aspect of left great toenail. No bleeding noted. Nail is split vertically throughout the entirety of the nail.   Neurological:     General: No focal deficit present.     Mental Status: He is alert and oriented to person, place, and time.  Psychiatric:        Mood and Affect: Mood normal.        Behavior: Behavior normal.        Thought Content: Thought content normal.      UC Treatments / Results  Labs (all labs ordered are listed, but only abnormal results are displayed) Labs Reviewed - No data to display  EKG   Radiology No results found.  Procedures Procedures (including critical care time)  Medications Ordered in UC Medications - No data to display  Initial Impression / Assessment and Plan / UC Course  I have reviewed the triage vital signs and the nursing notes.  Pertinent labs & imaging results that were available during my care of the patient were reviewed by me and considered in my medical decision making (see chart for details).    Paronychia Left Toe injury  Prescribed Bactrim BID x 7 days for infection of toe and nail Was able to trim some of the nail so that it will be less likely to get  caught on socks, etc Bulky dressing applied in office Follow up with this office or with primary care for increased swelling, redness, tenderness, warmth, drainage from the area.  Follow up in the ER for red streaking up your foot, high fever, trouble swallowing, trouble breathing, other concerning symptoms.   Final Clinical Impressions(s) / UC Diagnoses   Final diagnoses:  Injury of toenail of left foot, initial encounter  Paronychia of great toe of left foot     Discharge Instructions     I have sent in Bactrim for you to take twice a day for 7 days.  Follow up with this office or with primary care if symptoms are persisting.  Follow up in the ER for high fever, trouble swallowing, trouble breathing, other concerning symptoms.     ED Prescriptions    Medication Sig Dispense Auth. Provider   sulfamethoxazole-trimethoprim (BACTRIM DS) 800-160 MG tablet Take 1 tablet by mouth 2 (two) times daily for 7 days. 14 tablet Faustino Congress, NP     PDMP not reviewed this encounter.   Faustino Congress, NP 10/15/20 1659

## 2021-02-15 ENCOUNTER — Ambulatory Visit
Admission: EM | Admit: 2021-02-15 | Discharge: 2021-02-15 | Disposition: A | Discharge status: Home / Self Care | Payer: Medicare Other | Attending: Emergency Medicine | Admitting: Emergency Medicine

## 2021-02-15 ENCOUNTER — Other Ambulatory Visit: Payer: Self-pay

## 2021-02-15 DIAGNOSIS — U071 COVID-19: Secondary | ICD-10-CM | POA: Diagnosis not present

## 2021-02-15 DIAGNOSIS — Z1152 Encounter for screening for COVID-19: Secondary | ICD-10-CM | POA: Diagnosis not present

## 2021-02-15 MED ORDER — FLUTICASONE PROPIONATE 50 MCG/ACT NA SUSP: 2.0000 | Freq: Every day | NASAL | 0 refills | Status: DC

## 2021-02-15 MED ORDER — BENZONATATE 200 MG PO CAPS: 200.0000 mg | ORAL_CAPSULE | Freq: Three times a day (TID) | ORAL | 0 refills | Status: DC | PRN

## 2021-02-15 MED ORDER — MOLNUPIRAVIR EUA 200MG CAPSULE: 4.0000 | ORAL_CAPSULE | Freq: Two times a day (BID) | ORAL | 0 refills | Status: AC

## 2021-02-15 NOTE — Discharge Instructions (Addendum)
fnish the Molnupiravir, even if you feel better.  Flonase, saline nasal irrigation with a Milta Deiters Med rinse and distilled water as often as you want for the nasal congestion.  Mucinex will also be helpful. Tessalon for the cough.

## 2021-02-15 NOTE — ED Triage Notes (Signed)
Patient presents to Urgent Care with complaints of cough, nasal congestion, and fever. Pt tested positive for covid test.

## 2021-02-15 NOTE — ED Provider Notes (Signed)
HPI  SUBJECTIVE:  Darrell Lang is a 75 y.o. male who presents with fevers T-max 1007, headaches, body aches, nasal congestion, rhinorrhea, postnasal drip, scratchy, sore throat, cough starting last night.  No loss of sense of smell or taste, shortness of breath, nausea, vomiting, diarrhea.  He was exposed to COVID 4 days ago.  He did not get the COVID-vaccine.  He had a positive home COVID test today.  No aggravating or alleviating factors.  He has not tried anything for this.  He has a past medical history of COVID in 2020, was admitted, atrial fibrillation, diabetes, hypertension and "lung inflammation".  No history of pulmonary disease.  [facility]: [facility]   Past Medical History:  Diagnosis Date   Anxiety and depression    Atrial fibrillation with RVR ([place]) 02/08/2019   COVID-19 virus infection 02/09/2019   Diabetes ([place])    Encounter for screening colonoscopy 07/28/2013   Essential hypertension 02/09/2019   Hyperlipidemia 02/09/2019   Hypertension    Hypertriglyceridemia     Past Surgical History:  Procedure Laterality Date   APPENDECTOMY     COLONOSCOPY  Sept 2005   Dr. Gala Romney: internal hemorrhoids, otherwise normal rectum. Normal colon and normal TI   COLONOSCOPY N/A 08/06/2013   Procedure: COLONOSCOPY;  Surgeon: Daneil Dolin, MD;  Location: [facility];  Service: Endoscopy;  Laterality: N/A;  10:15    Family History  Problem Relation Age of Onset   Stroke Father    Colon cancer Neg Hx    Thyroid disease Neg Hx     Social History   Tobacco Use   Smoking status: Former    Packs/day: 0.00    Years: 26.00    Pack years: 0.00    Types: Cigarettes   Smokeless tobacco: Never   Tobacco comments:    Quit smoking in 1986  Substance Use Topics   Alcohol use: No    Comment: history of ETOH abuse in remote past, none currently.    Drug use: No    No current facility-administered medications for this encounter.  Current Outpatient Medications:    benzonatate (TESSALON) 200  MG capsule, Take 1 capsule (200 mg total) by mouth 3 (three) times daily as needed for cough., Disp: 30 capsule, Rfl: 0   fluticasone (FLONASE) 50 MCG/ACT nasal spray, Place 2 sprays into both nostrils daily., Disp: 16 g, Rfl: 0   molnupiravir EUA (LAGEVRIO) 200 mg CAPS capsule, Take 4 capsules (800 mg total) by mouth 2 (two) times daily for 5 days., Disp: 40 capsule, Rfl: 0   amLODipine (NORVASC) 5 MG tablet, Take 1 tablet (5 mg total) by mouth daily., Disp: 90 tablet, Rfl: 2   apixaban (ELIQUIS) 5 MG TABS tablet, Take 1 tablet (5 mg total) by mouth 2 (two) times daily., Disp: 180 tablet, Rfl: 3   benztropine (COGENTIN) 1 MG tablet, Take 1 mg by mouth at bedtime., Disp: , Rfl:    citalopram (CELEXA) 20 MG tablet, Take 20 mg by mouth daily., Disp: , Rfl:    clonazePAM (KLONOPIN) 0.5 MG tablet, Take 0.5 mg by mouth 2 (two) times daily. , Disp: , Rfl:    Cyanocobalamin (B-12 PO), Take 1 tablet by mouth daily., Disp: , Rfl:    empagliflozin (JARDIANCE) 10 MG TABS tablet, Take 10 mg by mouth daily., Disp: , Rfl:    glipiZIDE (GLUCOTROL) 5 MG tablet, Take 10 mg by mouth 2 (two) times daily before a meal., Disp: , Rfl:    losartan (  COZAAR) 100 MG tablet, Take 100 mg by mouth daily., Disp: , Rfl:    metFORMIN (GLUCOPHAGE) 1000 MG tablet, Take 1,000 mg by mouth 2 (two) times daily with a meal. 1000mg  AM; 1500mg  PM, Disp: , Rfl:    methimazole (TAPAZOLE) 5 MG tablet, Take 1 tablet (5 mg total) by mouth daily. Hyperthyroidism: E05.90, Disp: 90 tablet, Rfl: 0   metoprolol tartrate (LOPRESSOR) 25 MG tablet, Take 1 tablet (25 mg total) by mouth 2 (two) times daily., Disp: 60 tablet, Rfl: 0   Multiple Vitamin (MULTIVITAMIN) tablet, Take 1 tablet by mouth daily., Disp: , Rfl:    omeprazole (PRILOSEC) 20 MG capsule, Take 20 mg by mouth daily., Disp: , Rfl:    QUEtiapine (SEROQUEL) 200 MG tablet, Take 100 mg by mouth at bedtime. , Disp: , Rfl:    risperiDONE (RISPERDAL) 1 MG tablet, Take 1 mg by mouth at bedtime.,  Disp: , Rfl:    simvastatin (ZOCOR) 80 MG tablet, Take 40 mg by mouth at bedtime. , Disp: , Rfl:    tamsulosin (FLOMAX) 0.4 MG CAPS capsule, Take 0.8 mg by mouth at bedtime., Disp: , Rfl:   Allergies  Allergen Reactions   Empagliflozin     Other reaction(s): Urinary tract infectious disease   Lisinopril Cough   Sulfamethoxazole-Trimethoprim      ROS  As noted in HPI.   Physical Exam  BP (!) 162/70 (BP Location: Right Arm)   Pulse 86   Temp 98.3 F (36.8 C)   Resp 20   SpO2 95%   Constitutional: Well developed, well nourished, no acute distress Eyes:  EOMI, conjunctiva normal bilaterally HENT: Normocephalic, atraumatic,mucus membranes moist.  Positive nasal congestion. Respiratory: Normal inspiratory effort. diffuse rhonchi that clear with coughing.  No wheezing. Cardiovascular: Normal rate, regular rhythm, no murmurs rubs or gallops GI: nondistended skin: No rash, skin intact Musculoskeletal: no deformities Neurologic: Alert & oriented x 3, no focal neuro deficits Psychiatric: Speech and behavior appropriate   ED Course   Medications - No data to display  Orders Placed This Encounter  Procedures   Novel Coronavirus, NAA ([facility])    Standing Status:   Standing    Number of Occurrences:   1    No results found for this or any previous visit (from the past 24 hour(s)). No results found.  ED Clinical Impression  1. COVID-19 virus infection   2. Encounter for screening for COVID-19      ED Assessment/Plan  Patient with a positive home COVID test.  However is requesting confirmatory PCR test.  He qualifies for antivirals based on lack of vaccination status and comorbidities.  Will send home with Molnupiravir, Flonase, Tessalon.  He is to do saline nasal irrigation and Mucinex.  ER return precautions given.  Discussed MDM, treatment plan, and plan for follow-up with patient. Discussed sn/sx that should prompt return to the ED. patient agrees with plan.    Meds ordered this encounter  Medications   molnupiravir EUA (LAGEVRIO) 200 mg CAPS capsule    Sig: Take 4 capsules (800 mg total) by mouth 2 (two) times daily for 5 days.    Dispense:  40 capsule    Refill:  0   fluticasone (FLONASE) 50 MCG/ACT nasal spray    Sig: Place 2 sprays into both nostrils daily.    Dispense:  16 g    Refill:  0   benzonatate (TESSALON) 200 MG capsule    Sig: Take 1 capsule (200 mg total) by mouth  3 (three) times daily as needed for cough.    Dispense:  30 capsule    Refill:  0      *This clinic note was created using [employer]. Therefore, there may be occasional mistakes despite careful proofreading.  ?    Melynda Ripple, MD 02/16/21 [phone]

## 2021-02-16 LAB — SARS-COV-2, NAA 2 DAY TAT

## 2021-02-16 LAB — NOVEL CORONAVIRUS, NAA: SARS-CoV-2, NAA: DETECTED — AB

## 2021-05-14 ENCOUNTER — Encounter (HOSPITAL_COMMUNITY): Payer: Self-pay

## 2021-05-14 ENCOUNTER — Emergency Department (HOSPITAL_COMMUNITY)
Admission: EM | Admit: 2021-05-14 | Discharge: 2021-05-14 | Disposition: A | Discharge status: Home / Self Care | Payer: No Typology Code available for payment source | Attending: Physician Assistant | Admitting: Physician Assistant

## 2021-05-14 ENCOUNTER — Emergency Department (HOSPITAL_COMMUNITY): Payer: No Typology Code available for payment source

## 2021-05-14 DIAGNOSIS — E119 Type 2 diabetes mellitus without complications: Secondary | ICD-10-CM | POA: Diagnosis not present

## 2021-05-14 DIAGNOSIS — Z8616 Personal history of COVID-19: Secondary | ICD-10-CM | POA: Insufficient documentation

## 2021-05-14 DIAGNOSIS — Z87891 Personal history of nicotine dependence: Secondary | ICD-10-CM | POA: Diagnosis not present

## 2021-05-14 DIAGNOSIS — Z20822 Contact with and (suspected) exposure to covid-19: Secondary | ICD-10-CM | POA: Insufficient documentation

## 2021-05-14 DIAGNOSIS — N390 Urinary tract infection, site not specified: Secondary | ICD-10-CM

## 2021-05-14 DIAGNOSIS — S3022XA Contusion of scrotum and testes, initial encounter: Secondary | ICD-10-CM | POA: Diagnosis not present

## 2021-05-14 DIAGNOSIS — I10 Essential (primary) hypertension: Secondary | ICD-10-CM | POA: Diagnosis not present

## 2021-05-14 DIAGNOSIS — Z7901 Long term (current) use of anticoagulants: Secondary | ICD-10-CM | POA: Diagnosis not present

## 2021-05-14 DIAGNOSIS — Z79899 Other long term (current) drug therapy: Secondary | ICD-10-CM | POA: Insufficient documentation

## 2021-05-14 DIAGNOSIS — W1789XA Other fall from one level to another, initial encounter: Secondary | ICD-10-CM | POA: Insufficient documentation

## 2021-05-14 DIAGNOSIS — R509 Fever, unspecified: Secondary | ICD-10-CM | POA: Diagnosis present

## 2021-05-14 LAB — CBC WITH DIFFERENTIAL/PLATELET
Abs Immature Granulocytes: 0.35 10*3/uL — ABNORMAL HIGH (ref 0.00–0.07)
Abs Immature Granulocytes: 0.22 10*3/uL — ABNORMAL HIGH (ref 0.00–0.07)
Basophils Absolute: 0.1 10*3/uL (ref 0.0–0.1)
Basophils Absolute: 0.1 10*3/uL (ref 0.0–0.1)
Basophils Relative: 0 %
Basophils Relative: 0 %
Eosinophils Absolute: 0 10*3/uL (ref 0.0–0.5)
Eosinophils Absolute: 0 10*3/uL (ref 0.0–0.5)
Eosinophils Relative: 0 %
Eosinophils Relative: 0 %
HCT: 35.1 % — ABNORMAL LOW (ref 39.0–52.0)
HCT: 33.3 % — ABNORMAL LOW (ref 39.0–52.0)
Hemoglobin: 11.2 g/dL — ABNORMAL LOW (ref 13.0–17.0)
Hemoglobin: 10.7 g/dL — ABNORMAL LOW (ref 13.0–17.0)
Immature Granulocytes: 1 %
Immature Granulocytes: 1 %
Lymphocytes Relative: 4 %
Lymphocytes Relative: 7 %
Lymphs Abs: 1 10*3/uL (ref 0.7–4.0)
Lymphs Abs: 1.5 10*3/uL (ref 0.7–4.0)
MCH: 28.4 pg (ref 26.0–34.0)
MCH: 28.6 pg (ref 26.0–34.0)
MCHC: 31.9 g/dL (ref 30.0–36.0)
MCHC: 32.1 g/dL (ref 30.0–36.0)
MCV: 88.9 fL (ref 80.0–100.0)
MCV: 89 fL (ref 80.0–100.0)
Monocytes Absolute: 2.1 10*3/uL — ABNORMAL HIGH (ref 0.1–1.0)
Monocytes Absolute: 1.8 10*3/uL — ABNORMAL HIGH (ref 0.1–1.0)
Monocytes Relative: 8 %
Monocytes Relative: 8 %
Neutro Abs: 22.7 10*3/uL — ABNORMAL HIGH (ref 1.7–7.7)
Neutro Abs: 18.3 10*3/uL — ABNORMAL HIGH (ref 1.7–7.7)
Neutrophils Relative %: 87 %
Neutrophils Relative %: 84 %
Platelets: 224 10*3/uL (ref 150–400)
Platelets: 225 10*3/uL (ref 150–400)
RBC: 3.95 MIL/uL — ABNORMAL LOW (ref 4.22–5.81)
RBC: 3.74 MIL/uL — ABNORMAL LOW (ref 4.22–5.81)
RDW: 13.7 % (ref 11.5–15.5)
RDW: 13.8 % (ref 11.5–15.5)
WBC: 26.2 10*3/uL — ABNORMAL HIGH (ref 4.0–10.5)
WBC: 21.8 10*3/uL — ABNORMAL HIGH (ref 4.0–10.5)
nRBC: 0 % (ref 0.0–0.2)
nRBC: 0 % (ref 0.0–0.2)

## 2021-05-14 LAB — URINALYSIS, ROUTINE W REFLEX MICROSCOPIC
Bilirubin Urine: NEGATIVE
Glucose, UA: NEGATIVE mg/dL
Hgb urine dipstick: NEGATIVE
Ketones, ur: NEGATIVE mg/dL
Nitrite: NEGATIVE
Protein, ur: 30 mg/dL — AB
Specific Gravity, Urine: 1.027 (ref 1.005–1.030)
WBC, UA: >50 WBC/hpf — ABNORMAL HIGH (ref 0–5)
pH: 6 (ref 5.0–8.0)

## 2021-05-14 LAB — COMPREHENSIVE METABOLIC PANEL
ALT: 18 U/L (ref 0–44)
AST: 21 U/L (ref 15–41)
Albumin: 4.1 g/dL (ref 3.5–5.0)
Alkaline Phosphatase: 85 U/L (ref 38–126)
Anion gap: 10 (ref 5–15)
BUN: 10 mg/dL (ref 8–23)
CO2: 23 mmol/L (ref 22–32)
Calcium: 8.6 mg/dL — ABNORMAL LOW (ref 8.9–10.3)
Chloride: 98 mmol/L (ref 98–111)
Creatinine, Ser: 1.06 mg/dL (ref 0.61–1.24)
GFR, Estimated: >60 mL/min (ref >60)
Glucose, Bld: 203 mg/dL — ABNORMAL HIGH (ref 70–99)
Potassium: 4.3 mmol/L (ref 3.5–5.1)
Sodium: 131 mmol/L — ABNORMAL LOW (ref 135–145)
Total Bilirubin: 1.1 mg/dL (ref 0.3–1.2)
Total Protein: 7.4 g/dL (ref 6.5–8.1)

## 2021-05-14 LAB — LACTIC ACID, PLASMA
Lactic Acid, Venous: 2.1 mmol/L (ref 0.5–1.9)
Lactic Acid, Venous: 1.9 mmol/L (ref 0.5–1.9)

## 2021-05-14 LAB — RESP PANEL BY RT-PCR (FLU A&B, COVID) ARPGX2
Influenza A by PCR: NEGATIVE
Influenza B by PCR: NEGATIVE
SARS Coronavirus 2 by RT PCR: NEGATIVE

## 2021-05-14 MED ORDER — SODIUM CHLORIDE 0.9 % IV SOLN
2.0000 g | INTRAVENOUS | Status: DC
  Administered 2021-05-14: 2 g via INTRAVENOUS
  Filled 2021-05-14: qty 20

## 2021-05-14 MED ORDER — LACTATED RINGERS IV BOLUS (SEPSIS)
1000.0000 mL | Freq: Once | INTRAVENOUS | Status: AC
  Administered 2021-05-14: 1000 mL via INTRAVENOUS

## 2021-05-14 MED ORDER — AMOXICILLIN-POT CLAVULANATE 875-125 MG PO TABS: 1.0000 | ORAL_TABLET | Freq: Two times a day (BID) | ORAL | 0 refills | Status: AC

## 2021-05-14 MED ORDER — IOHEXOL 300 MG/ML  SOLN
100.0000 mL | Freq: Once | INTRAMUSCULAR | Status: AC | PRN
  Administered 2021-05-14: 100 mL via INTRAVENOUS

## 2021-05-14 MED ORDER — LACTATED RINGERS IV SOLN: INTRAVENOUS | Status: DC

## 2021-05-14 NOTE — ED Provider Notes (Signed)
[name] EMERGENCY DEPARTMENT Provider Note   CSN: [id number] Arrival date & time: 05/14/21  1224     History Chief Complaint  Patient presents with   Fever   Groin Pain    Darrell Lang is a 75 y.o. male.  Pt reports he fell out of his truck 2 days ago and landed on his scrotum.  Pt complains of swelling and pain.  Pt reports last pm he had a fever of 101.  Pt reports he has also had diarrhea on and off for the past week.  Pt is on eliquis.  Pt reports he did not hit his head.  Pt denies cough or shortness of breath   The history is provided by the patient. No language interpreter was used.  Fever Max temp prior to arrival:  101 Temp source:  Oral Severity:  Moderate Onset quality:  Sudden Duration:  1 day Timing:  Constant Progression:  Resolved Chronicity:  New Relieved by:  Nothing Associated symptoms: diarrhea   Associated symptoms: no chest pain and no cough   Risk factors: recent sickness   Risk factors: no contaminated food, no contaminated water, no immunosuppression, no recent travel and no sick contacts   Groin Pain This is a new problem. The current episode started 2 days ago. The problem occurs constantly. Pertinent negatives include no chest pain. Nothing aggravates the symptoms. Nothing relieves the symptoms. He has tried nothing for the symptoms.      Past Medical History:  Diagnosis Date   Anxiety and depression    Atrial fibrillation with RVR ([place]) 02/08/2019   COVID-19 virus infection 02/09/2019   Diabetes ([place])    Encounter for screening colonoscopy 07/28/2013   Essential hypertension 02/09/2019   Hyperlipidemia 02/09/2019   Hypertension    Hypertriglyceridemia     Patient Active Problem List   Diagnosis Date Noted   Hyperthyroidism 03/11/2019   COVID-19 virus infection 02/09/2019   Essential hypertension 02/09/2019   Hyperlipidemia 02/09/2019   Diabetes ([place]) 02/09/2019   Atrial fibrillation with RVR ([place]) 02/08/2019   Encounter for  screening colonoscopy 07/28/2013    Past Surgical History:  Procedure Laterality Date   APPENDECTOMY     COLONOSCOPY  Sept 2005   Dr. Gala Romney: internal hemorrhoids, otherwise normal rectum. Normal colon and normal TI   COLONOSCOPY N/A 08/06/2013   Procedure: COLONOSCOPY;  Surgeon: Daneil Dolin, MD;  Location: [facility]ITE;  Service: Endoscopy;  Laterality: N/A;  10:15       Family History  Problem Relation Age of Onset   Stroke Father    Colon cancer Neg Hx    Thyroid disease Neg Hx     Social History   Tobacco Use   Smoking status: Former    Packs/day: 0.00    Years: 26.00    Pack years: 0.00    Types: Cigarettes   Smokeless tobacco: Never   Tobacco comments:    Quit smoking in 1986  Substance Use Topics   Alcohol use: No    Comment: history of ETOH abuse in remote past, none currently.    Drug use: No    Home Medications Prior to Admission medications   Medication Sig Start Date End Date Taking? Authorizing Provider  amLODipine (NORVASC) 5 MG tablet Take 1 tablet (5 mg total) by mouth daily. 08/12/19  Yes Weaver, Scott T, PA-C  apixaban (ELIQUIS) 5 MG TABS tablet Take 1 tablet (5 mg total) by mouth 2 (two) times daily. 02/24/19  Yes Nahser,  Wonda Cheng, MD  benztropine (COGENTIN) 1 MG tablet Take 1 mg by mouth at bedtime.   Yes [provider]  citalopram (CELEXA) 20 MG tablet Take 20 mg by mouth daily.   Yes [provider]  Cyanocobalamin (B-12 PO) Take 1 tablet by mouth daily.   Yes [provider]  glipiZIDE (GLUCOTROL) 5 MG tablet Take 10 mg by mouth 2 (two) times daily before a meal.   Yes [provider]  benzonatate (TESSALON) 200 MG capsule Take 1 capsule (200 mg total) by mouth 3 (three) times daily as needed for cough. Patient not taking: Reported on 05/14/2021 02/15/21   Melynda Ripple, MD  clonazePAM (KLONOPIN) 0.5 MG tablet Take 0.5 mg by mouth 2 (two) times daily.  Patient not taking: Reported on 05/14/2021     [provider]  fluticasone (FLONASE) 50 MCG/ACT nasal spray Place 2 sprays into both nostrils daily. Patient not taking: Reported on 05/14/2021 02/15/21   Melynda Ripple, MD  losartan (COZAAR) 100 MG tablet Take 100 mg by mouth daily.    [provider]  metFORMIN (GLUCOPHAGE) 1000 MG tablet Take 1,000 mg by mouth 2 (two) times daily with a meal. 1000mg  AM; 1500mg  PM    [provider]  methimazole (TAPAZOLE) 5 MG tablet Take 1 tablet (5 mg total) by mouth daily. Hyperthyroidism: E05.90 07/01/19   Renato Shin, MD  metoprolol tartrate (LOPRESSOR) 25 MG tablet Take 1 tablet (25 mg total) by mouth 2 (two) times daily. 03/06/19   Nahser, Wonda Cheng, MD  Multiple Vitamin (MULTIVITAMIN) tablet Take 1 tablet by mouth daily.    [provider]  omeprazole (PRILOSEC) 20 MG capsule Take 20 mg by mouth daily.    [provider]  QUEtiapine (SEROQUEL) 200 MG tablet Take 100 mg by mouth at bedtime.     [provider]  risperiDONE (RISPERDAL) 1 MG tablet Take 1 mg by mouth at bedtime.    [provider]  simvastatin (ZOCOR) 80 MG tablet Take 40 mg by mouth at bedtime.     [provider]  tamsulosin (FLOMAX) 0.4 MG CAPS capsule Take 0.8 mg by mouth at bedtime. 10/28/19   [provider]  haloperidol (HALDOL) 2 MG tablet Take 2 mg by mouth every 8 (eight) hours as needed for agitation. Just takes one tablet at bedtime  01/13/19  [provider]    Allergies    Empagliflozin, Lisinopril, and Sulfamethoxazole-trimethoprim  Review of Systems   Review of Systems  Constitutional:  Positive for fever.  Respiratory:  Negative for cough.   Cardiovascular:  Negative for chest pain.  Gastrointestinal:  Positive for diarrhea.  All other systems reviewed and are negative.  Physical Exam Updated Vital Signs BP (!) 110/58    Pulse (!) 57    Temp 98.5 F (36.9 C) (Oral)    Resp 14    Ht 5\' 11"  (1.803 m)    Wt 90.7 kg    SpO2  97%    BMI 27.89 kg/m   Physical Exam Vitals and nursing note reviewed.  Constitutional:      Appearance: He is well-developed.  HENT:     Head: Normocephalic.     Mouth/Throat:     Mouth: Mucous membranes are moist.  Eyes:     Extraocular Movements: Extraocular movements intact.     Pupils: Pupils are equal, round, and reactive to light.  Cardiovascular:     Rate and Rhythm: Normal rate.  Pulmonary:  Effort: Pulmonary effort is normal.  Abdominal:     General: Abdomen is flat. There is no distension.  Genitourinary:    Comments: Swollen tender scrotum, left more swollen than right  Musculoskeletal:        General: Normal range of motion.     Cervical back: Normal range of motion.  Neurological:     General: No focal deficit present.     Mental Status: He is alert and oriented to person, place, and time.  Psychiatric:        Mood and Affect: Mood normal.    ED Results / Procedures / Treatments   Labs (all labs ordered are listed, but only abnormal results are displayed) Labs Reviewed  CBC WITH DIFFERENTIAL/PLATELET - Abnormal; Notable for the following components:      Result Value   WBC 26.2 (*)    RBC 3.95 (*)    Hemoglobin 11.2 (*)    HCT 35.1 (*)    Neutro Abs 22.7 (*)    Monocytes Absolute 2.1 (*)    Abs Immature Granulocytes 0.35 (*)    All other components within normal limits  COMPREHENSIVE METABOLIC PANEL - Abnormal; Notable for the following components:   Sodium 131 (*)    Glucose, Bld 203 (*)    Calcium 8.6 (*)    All other components within normal limits  URINALYSIS, ROUTINE W REFLEX MICROSCOPIC - Abnormal; Notable for the following components:   APPearance HAZY (*)    Protein, ur 30 (*)    Leukocytes,Ua LARGE (*)    WBC, UA >50 (*)    Bacteria, UA MANY (*)    All other components within normal limits  LACTIC ACID, PLASMA - Abnormal; Notable for the following components:   Lactic Acid, Venous 2.1 (*)    All other components within normal  limits  RESP PANEL BY RT-PCR (FLU A&B, COVID) ARPGX2  CULTURE, BLOOD (SINGLE)  LACTIC ACID, PLASMA    EKG None  Radiology CT PELVIS W CONTRAST  Result Date: 05/14/2021 CLINICAL DATA:  Pt reports fevers at home that started last night, treated with tylenol at home. Pt also having diarrhea for approximately one week and an ongoing cough. During triage pt reports falling out of his truck onto the ground and feels like he pulled a muscle in his groin. Pt anticoagulated with Eliquis. Denies head injury/LOC. EXAM: CT PELVIS WITH CONTRAST TECHNIQUE: Multidetector CT imaging of the pelvis was performed using the standard protocol following the bolus administration of intravenous contrast. CONTRAST:  171mL OMNIPAQUE IOHEXOL 300 MG/ML  SOLN COMPARISON:  10/01/2019.  02/15/2004. FINDINGS: Urinary Tract: Mild wall thickening of bladder. Two densities along the left bladder wall, 1 projecting within the wall, not seen on the most recent prior CT. These may reflect vascular clips. No bladder mass. No adjacent inflammatory change. Bowel: Visualized bowel is normal in caliber. No wall thickening or inflammation. Vascular/Lymphatic: Aortoiliac atherosclerosis. No significant stenosis. No aneurysm. No lower abdominal or pelvic lymphadenopathy. Reproductive: Dense material noted in the scrotum adjacent to the left testicle, extending from the inferior inguinal canal. This may reflect venous thrombosis. There is scrotal edema and evidence of hydroceles. Other:  No inguinal/abdominal wall hernia.  No ascites. Musculoskeletal: No fracture or acute finding.  No bone lesion. IMPRESSION: 1. Abnormal appearance of the scrotum area of serpiginous increased attenuation extending from the inferior inguinal canal to lie adjacent to the left testicle. This may reflect venous thrombosis. There is also scrotal edema evidence of hydroceles. Consider follow-up  assessment with scrotal ultrasound including Doppler imaging. 2. No evidence  of an inguinal/abdominal wall hernia. 3. Mild wall thickening of the bladder, which may be chronic. Consider cystitis if there are consistent clinical findings. Electronically Signed   By: Lajean Manes M.D.   On: 05/14/2021 15:27    Procedures Procedures   Medications Ordered in ED Medications  lactated ringers infusion (has no administration in time range)  cefTRIAXone (ROCEPHIN) 2 g in sodium chloride 0.9 % 100 mL IVPB (2 g Intravenous New Bag/Given 05/14/21 1558)  iohexol (OMNIPAQUE) 300 MG/ML solution 100 mL (100 mLs Intravenous Contrast Given 05/14/21 1438)  lactated ringers bolus 1,000 mL (1,000 mLs Intravenous New Bag/Given 05/14/21 1552)    And  lactated ringers bolus 1,000 mL (1,000 mLs Intravenous New Bag/Given 05/14/21 1552)    And  lactated ringers bolus 1,000 mL (1,000 mLs Intravenous New Bag/Given 05/14/21 1557)    ED Course  I have reviewed the triage vital signs and the nursing notes.  Pertinent labs & imaging results that were available during my care of the patient were reviewed by me and considered in my medical decision making (see chart for details).    MDM Rules/Calculators/A&P                         Dr. Roderic Palau in to see and examine.  Pt's wbc count elevated to 26. Pt given iv fluids and Rocephin  This patient presents to the ED for concern of scrotal injury diarrhea and fever, this involves an extensive number of treatment options, and is a complaint that carries with it a high risk of complications and morbidity.  The differential diagnosis includes scrotal injury, infectious diarrhea, covid influenza, viral illness   Co morbidities that complicate the patient evaluation  Diabetes AFib Hypertension   Additional history obtained:  Additional history obtained from pts wife External records from outside source obtained and reviewed including primary care records from [place]   Lab Tests:  I Ordered, and personally interpreted labs.  The pertinent results  include:  elevated wbc count of 26. Ua shows greater than 50 wbc's,  this is consistent with uti.  Lactic acid 2.1 on initial test and 1.9 on repeat,  Wbc count decreased from 26 to 21.   Imaging Studies ordered:  I ordered imaging studies including ct pelvis I independently visualized and interpreted imaging which showed scrotal edema  I agree with the radiologist interpretation   Cardiac Monitoring:  The patient was maintained on a cardiac monitor.  I personally viewed and interpreted the cardiac monitored which showed an underlying rhythm of: normal sinus    Medicines ordered and prescription drug management:  I ordered medication including Rocephin 2 grams   for UTi Reevaluation of the patient after these medicines showed that the patient improved I have reviewed the patients home medicines and have made adjustments as needed   Test Considered:  Urine culture added    Critical Interventions:  Pt given iv fluids and iv antibiotics   Consultations Obtained:  I requested consultation with the Urologist on call  Dr. Diona Fanti and discussed lab and imaging findings as well as pertinent plan - they recommend: Augmentin po and office follow up.     Problem List / ED Course:  Scrotal hematoma UTi  Diabetes   Reevaluation:  After the interventions noted above, I reevaluated the patient and found that they have : improved   Social Determinants of Health:  Dispostion:  After consideration of the diagnostic results and the patients response to treatment, I feel that the patent would benefit from oral antibiotics.  Pt offered option of observation in hospital.  Pt reports he feels better.      Final Clinical Impression(s) / ED Diagnoses Final diagnoses:  Urinary tract infection with hematuria, site unspecified  Hematoma of scrotum    Rx / DC Orders ED Discharge Orders          Ordered    amoxicillin-clavulanate (AUGMENTIN) 875-125 MG tablet  2 times daily         05/14/21 1913          An After Visit Summary was printed and given to the patient.    Sidney Ace 05/14/21 2053    Milton Ferguson, MD 05/17/21 [phone]

## 2021-05-14 NOTE — Progress Notes (Signed)
[facility] following for sepsis protocol. 

## 2021-05-14 NOTE — ED Triage Notes (Signed)
Pt reports fevers at home that started last night, treated with tylenol at home. Pt also having diarrhea for approximately one week and an ongoing cough. During triage pt reports falling out of his truck onto the ground and feels like he pulled a muscle in his groin. Pt anticoagulated with Eliquis. Denies head injury/LOC.

## 2021-05-17 ENCOUNTER — Emergency Department (HOSPITAL_COMMUNITY): Payer: No Typology Code available for payment source

## 2021-05-17 ENCOUNTER — Emergency Department (HOSPITAL_COMMUNITY)
Admission: EM | Admit: 2021-05-17 | Discharge: 2021-05-17 | Disposition: A | Discharge status: Home / Self Care | Payer: No Typology Code available for payment source | Attending: Emergency Medicine | Admitting: Emergency Medicine

## 2021-05-17 ENCOUNTER — Encounter (HOSPITAL_COMMUNITY): Payer: Self-pay | Admitting: *Deleted

## 2021-05-17 DIAGNOSIS — D649 Anemia, unspecified: Secondary | ICD-10-CM | POA: Diagnosis not present

## 2021-05-17 DIAGNOSIS — Z794 Long term (current) use of insulin: Secondary | ICD-10-CM | POA: Insufficient documentation

## 2021-05-17 DIAGNOSIS — Z7901 Long term (current) use of anticoagulants: Secondary | ICD-10-CM | POA: Insufficient documentation

## 2021-05-17 DIAGNOSIS — I10 Essential (primary) hypertension: Secondary | ICD-10-CM | POA: Insufficient documentation

## 2021-05-17 DIAGNOSIS — E119 Type 2 diabetes mellitus without complications: Secondary | ICD-10-CM | POA: Insufficient documentation

## 2021-05-17 DIAGNOSIS — Z7984 Long term (current) use of oral hypoglycemic drugs: Secondary | ICD-10-CM | POA: Insufficient documentation

## 2021-05-17 DIAGNOSIS — R0602 Shortness of breath: Secondary | ICD-10-CM

## 2021-05-17 DIAGNOSIS — I4891 Unspecified atrial fibrillation: Secondary | ICD-10-CM | POA: Insufficient documentation

## 2021-05-17 DIAGNOSIS — Z20822 Contact with and (suspected) exposure to covid-19: Secondary | ICD-10-CM | POA: Insufficient documentation

## 2021-05-17 DIAGNOSIS — Z79899 Other long term (current) drug therapy: Secondary | ICD-10-CM | POA: Diagnosis not present

## 2021-05-17 LAB — CBC WITH DIFFERENTIAL/PLATELET
Abs Immature Granulocytes: 0.03 10*3/uL (ref 0.00–0.07)
Basophils Absolute: 0.1 10*3/uL (ref 0.0–0.1)
Basophils Relative: 1 %
Eosinophils Absolute: 0.1 10*3/uL (ref 0.0–0.5)
Eosinophils Relative: 1 %
HCT: 30.9 % — ABNORMAL LOW (ref 39.0–52.0)
Hemoglobin: 9.9 g/dL — ABNORMAL LOW (ref 13.0–17.0)
Immature Granulocytes: 0 %
Lymphocytes Relative: 10 %
Lymphs Abs: 0.9 10*3/uL (ref 0.7–4.0)
MCH: 28.5 pg (ref 26.0–34.0)
MCHC: 32 g/dL (ref 30.0–36.0)
MCV: 89 fL (ref 80.0–100.0)
Monocytes Absolute: 0.8 10*3/uL (ref 0.1–1.0)
Monocytes Relative: 9 %
Neutro Abs: 6.8 10*3/uL (ref 1.7–7.7)
Neutrophils Relative %: 79 %
Platelets: 261 10*3/uL (ref 150–400)
RBC: 3.47 MIL/uL — ABNORMAL LOW (ref 4.22–5.81)
RDW: 13.9 % (ref 11.5–15.5)
WBC: 8.6 10*3/uL (ref 4.0–10.5)
nRBC: 0 % (ref 0.0–0.2)

## 2021-05-17 LAB — BASIC METABOLIC PANEL
Anion gap: 10 (ref 5–15)
BUN: 7 mg/dL — ABNORMAL LOW (ref 8–23)
CO2: 24 mmol/L (ref 22–32)
Calcium: 8.9 mg/dL (ref 8.9–10.3)
Chloride: 105 mmol/L (ref 98–111)
Creatinine, Ser: 0.83 mg/dL (ref 0.61–1.24)
GFR, Estimated: >60 mL/min (ref >60)
Glucose, Bld: 148 mg/dL — ABNORMAL HIGH (ref 70–99)
Potassium: 3.6 mmol/L (ref 3.5–5.1)
Sodium: 139 mmol/L (ref 135–145)

## 2021-05-17 LAB — RESP PANEL BY RT-PCR (FLU A&B, COVID) ARPGX2
Influenza A by PCR: NEGATIVE
Influenza B by PCR: NEGATIVE
SARS Coronavirus 2 by RT PCR: NEGATIVE

## 2021-05-17 LAB — URINE CULTURE
Culture: >=100000 — AB
Special Requests: NORMAL

## 2021-05-17 LAB — D-DIMER, QUANTITATIVE: D-Dimer, Quant: 2.07 ug/mL-FEU — ABNORMAL HIGH (ref 0.00–0.50)

## 2021-05-17 LAB — TROPONIN I (HIGH SENSITIVITY)
Troponin I (High Sensitivity): 16 ng/L (ref <18)
Troponin I (High Sensitivity): 14 ng/L (ref <18)

## 2021-05-17 MED ORDER — IOHEXOL 350 MG/ML SOLN
100.0000 mL | Freq: Once | INTRAVENOUS | Status: AC | PRN
  Administered 2021-05-17: 100 mL via INTRAVENOUS

## 2021-05-17 NOTE — Discharge Instructions (Signed)
Please continue to take your omeprazole daily as directed.  Follow-up this week with your primary care provider at the [facility] regarding your hemoglobin level.  This blood level will need to be rechecked in 1 to 2 weeks.  Return to the emergency department for any new or worsening symptoms.

## 2021-05-17 NOTE — ED Triage Notes (Incomplete)
Short of breath onset 2 days ago

## 2021-05-17 NOTE — ED Notes (Signed)
Ambulated by Elenora Gamma., RN. Sats were 93-94% on room air.  Tolerated well.

## 2021-05-17 NOTE — ED Provider Notes (Signed)
[facility] Provider Note   CSN: [id number] Arrival date & time: 05/17/21  1039     History  Chief Complaint  Patient presents with   Shortness of Breath    Darrell Lang is a 76 y.o. male.   Shortness of Breath Associated symptoms: no chest pain, no fever and no vomiting        Darrell Lang is a 76 y.o. male with past medical history significant for atrial fibrillation with RVR, hypertension, anxiety, type 2 diabetes, who presents to the Emergency Department complaining of shortness of breath and chest congestion.  Symptoms present for several days.  Patient's spouse states that he seems to have labored breathing.      Home Medications Prior to Admission medications   Medication Sig Start Date End Date Taking? Authorizing Provider  amLODipine (NORVASC) 5 MG tablet Take 1 tablet (5 mg total) by mouth daily. 08/12/19   Richardson Dopp T, PA-C  amoxicillin-clavulanate (AUGMENTIN) 875-125 MG tablet Take 1 tablet by mouth 2 (two) times daily for 10 days. 05/14/21 05/24/21  Fransico Meadow, PA-C  apixaban (ELIQUIS) 5 MG TABS tablet Take 1 tablet (5 mg total) by mouth 2 (two) times daily. 02/24/19   Nahser, Wonda Cheng, MD  atorvastatin (LIPITOR) 10 MG tablet Take 10 mg by mouth daily. 06/21/20   [provider]  benzonatate (TESSALON) 200 MG capsule Take 1 capsule (200 mg total) by mouth 3 (three) times daily as needed for cough. Patient not taking: Reported on 05/14/2021 02/15/21   Melynda Ripple, MD  benztropine (COGENTIN) 1 MG tablet Take 1 mg by mouth at bedtime.    [provider]  busPIRone (BUSPAR) 5 MG tablet Take 2.5 mg by mouth daily. Anxiety 09/21/20   [provider]  carboxymethylcellulose (REFRESH PLUS) 0.5 % SOLN Place 1 drop into both eyes daily as needed (dry eye). 04/11/21   [provider]  Carboxymethylcellulose Sodium 1 % GEL Place 1 drop into both eyes as needed (eye glued togeather). 12/09/20   [provider]  Cholecalciferol 50 MCG (2000 UT) TABS Take 1 tablet by mouth daily. 06/21/20   [provider]  citalopram (CELEXA) 20 MG tablet Take 20 mg by mouth daily.    [provider]  clonazePAM (KLONOPIN) 0.5 MG tablet Take 0.5 mg by mouth 2 (two) times daily.  Patient not taking: Reported on 05/14/2021    [provider]  Cyanocobalamin (B-12 PO) Take 1 tablet by mouth daily.    [provider]  Eyelid Cleansers (AVENOVA) 0.01 % SOLN Place 1 spray into both eyes in the morning and at bedtime. 12/12/20   [provider]  finasteride (PROSCAR) 5 MG tablet Take 5 mg by mouth daily. 06/21/20   [provider]  fluticasone (FLONASE) 50 MCG/ACT nasal spray Place 2 sprays into both nostrils daily. Patient not taking: Reported on 05/14/2021 02/15/21   Melynda Ripple, MD  gabapentin (NEURONTIN) 300 MG capsule Take 1 capsule by mouth 2 (two) times daily. 05/11/20   [provider]  glipiZIDE (GLUCOTROL) 5 MG tablet Take 5 mg by mouth See admin instructions. Take 5 mg in the morning and 2.5 mg every evening    [provider]  glucose-Vitamin C 4-0.006 GM CHEW chewable tablet CHEW FOUR TABLETS BY MOUTH  AS NEEDED AS DIRECTED FOR BLOOD SUGAR LESS THAN 70 (REPEAT EVERY 15 MINUTES IF BLOOD SUGAR LESS THAN 70) Patient not taking: Reported on 05/14/2021 09/30/20   [provider]  haloperidol (HALDOL) 2 MG tablet Take 1 tablet by mouth every evening. Patient not taking: Reported on 05/14/2021 04/20/17   [provider]  losartan (COZAAR) 100 MG tablet Take 100 mg by mouth daily.    [provider]  Magnesium Oxide 420 MG TABS Take 1 tablet by mouth daily. 12/19/20   [provider]  metFORMIN (GLUCOPHAGE) 1000 MG tablet Take 1,000 mg by mouth 2 (two) times daily with a meal. 1000mg  AM; 1500mg  PM    [provider]  methimazole (TAPAZOLE) 5 MG tablet Take 1 tablet (5 mg total) by mouth daily.  Hyperthyroidism: E05.90 07/01/19   Renato Shin, MD  metoprolol tartrate (LOPRESSOR) 25 MG tablet Take 1 tablet (25 mg total) by mouth 2 (two) times daily. 03/06/19   Nahser, Wonda Cheng, MD  Multiple Vitamin (MULTIVITAMIN) tablet Take 1 tablet by mouth daily.    [provider]  omeprazole (PRILOSEC) 20 MG capsule Take 20 mg by mouth daily.    [provider]  QUEtiapine (SEROQUEL) 200 MG tablet Take 50 mg by mouth at bedtime.    [provider]  risperiDONE (RISPERDAL) 1 MG tablet Take 1 mg by mouth at bedtime.    [provider]  Semaglutide,0.25 or 0.5MG /DOS, 2 MG/1.5ML SOPN Inject 0.5 mg into the skin once a week. Monday 05/03/21   [provider]  simvastatin (ZOCOR) 80 MG tablet Take 40 mg by mouth at bedtime.     [provider]  tamsulosin (FLOMAX) 0.4 MG CAPS capsule Take 0.8 mg by mouth at bedtime. 10/28/19   [provider]  vitamin B-12 (CYANOCOBALAMIN) 500 MCG tablet Take 2 tablets by mouth 2 (two) times daily. 06/21/20   [provider]      Allergies    Empagliflozin, Lisinopril, and Sulfamethoxazole-trimethoprim    Review of Systems   Review of Systems  Constitutional:  Negative for chills and fever.  HENT:  Negative for congestion.   Respiratory:  Positive for shortness of breath.   Cardiovascular:  Negative for chest pain.  Gastrointestinal:  Negative for nausea and vomiting.  All other systems reviewed and are negative.  Physical Exam Updated Vital Signs BP (!) 163/69    Pulse 97    Temp 98.1 F (36.7 C)    Resp 17    SpO2 97%  Physical Exam Vitals and nursing note reviewed. Exam conducted with a chaperone present.  Constitutional:      Appearance: He is well-developed. He is not ill-appearing or toxic-appearing.  HENT:     Mouth/Throat:     Mouth: Mucous membranes are moist.     Pharynx: Oropharynx is clear. No pharyngeal swelling.  Cardiovascular:     Rate and Rhythm: Normal rate and regular  rhythm.     Pulses: Normal pulses.  Pulmonary:     Effort: Pulmonary effort is normal. No accessory muscle usage or respiratory distress.     Breath sounds: Normal breath sounds.  Abdominal:     Palpations: Abdomen is soft.     Tenderness: There is no abdominal tenderness.  Genitourinary:    Rectum: Guaiac result positive.     Comments: Brown, loose slightly heme positive stool Musculoskeletal:        General: Normal range of motion.     Cervical back: Neck supple.     Right lower leg: No edema.     Left lower leg: No edema.  Lymphadenopathy:     Cervical: No cervical adenopathy.  Skin:  General: Skin is warm.     Capillary Refill: Capillary refill takes less than 2 seconds.     Findings: No rash.  Neurological:     General: No focal deficit present.     Mental Status: He is alert.     Sensory: No sensory deficit.     Motor: No weakness.    ED Results / Procedures / Treatments   Labs (all labs ordered are listed, but only abnormal results are displayed) Labs Reviewed  CBC WITH DIFFERENTIAL/PLATELET - Abnormal; Notable for the following components:      Result Value   RBC 3.47 (*)    Hemoglobin 9.9 (*)    HCT 30.9 (*)    All other components within normal limits  BASIC METABOLIC PANEL - Abnormal; Notable for the following components:   Glucose, Bld 148 (*)    BUN 7 (*)    All other components within normal limits  D-DIMER, QUANTITATIVE - Abnormal; Notable for the following components:   D-Dimer, Quant 2.07 (*)    All other components within normal limits  RESP PANEL BY RT-PCR (FLU A&B, COVID) ARPGX2  POC OCCULT BLOOD, ED  TROPONIN I (HIGH SENSITIVITY)  TROPONIN I (HIGH SENSITIVITY)    EKG EKG Interpretation  Date/Time:  Tuesday May 17 2021 11:14:53 EST Ventricular Rate:  87 PR Interval:    QRS Duration: 74 QT Interval:  354 QTC Calculation: 425 R Axis:   71 Text Interpretation: Atrial fibrillation Abnormal ECG When compared with ECG of 29-Apr-2019  12:39, afib replacing NSR Confirmed by Aletta Edouard [phone]) on 05/17/2021 11:25:33 AM  Radiology DG Chest 2 View  Result Date: 05/17/2021 CLINICAL DATA:  76 year old male with shortness of breath. EXAM: CHEST - 2 VIEW COMPARISON:  [facility] Portable chest 04/29/2019 and earlier. CTA chest 02/08/2019. FINDINGS: Lung volumes are stable, lower limits of normal. Mediastinal contours remain within normal limits. Visualized tracheal air column is within normal limits. Coarse increased bilateral pulmonary interstitial markings appear chronic and stable since 2020. Associated mild apical lung scarring. No pneumothorax, pulmonary edema, definite pleural effusion or confluent pulmonary opacity. No acute osseous abnormality identified. Negative visible bowel gas. IMPRESSION: Chronic lung disease suspected. No acute cardiopulmonary abnormality. Electronically Signed   By: Genevie Ann M.D.   On: 05/17/2021 11:30   CT Angio Chest PE W and/or Wo Contrast  Result Date: 05/17/2021 CLINICAL DATA:  Short of breath for 2 days, positive D-dimer EXAM: CT ANGIOGRAPHY CHEST WITH CONTRAST TECHNIQUE: Multidetector CT imaging of the chest was performed using the standard protocol during bolus administration of intravenous contrast. Multiplanar CT image reconstructions and MIPs were obtained to evaluate the vascular anatomy. CONTRAST:  144mL OMNIPAQUE IOHEXOL 350 MG/ML SOLN COMPARISON:  05/17/2021, 02/08/2019 FINDINGS: Cardiovascular: This is a technically adequate evaluation of the pulmonary vasculature. No filling defects or pulmonary emboli. The heart is unremarkable without pericardial effusion. No evidence of thoracic aortic aneurysm or dissection. Mild aortic atherosclerosis. Mediastinum/Nodes: Thyroid, trachea, and esophagus are unremarkable. Borderline enlarged mediastinal and hilar lymph nodes are seen, largest in the subcarinal region measuring 14 mm in short axis. These are likely reactive. Lungs/Pleura: Small  bilateral pleural effusions volume estimated less than 500 cc each. Minimal dependent atelectasis. No acute airspace disease or pneumothorax. Central airways are patent. Upper Abdomen: No acute abnormality. Musculoskeletal: No acute or destructive bony lesions. Reconstructed images demonstrate no additional findings. Review of the MIP images confirms the above findings. IMPRESSION: 1. No evidence of pulmonary embolus. 2. Small bilateral pleural  effusions. 3. Borderline enlarged mediastinal and hilar lymph nodes, likely reactive. 4.  Aortic Atherosclerosis (ICD10-I70.0). Electronically Signed   By: Randa Ngo M.D.   On: 05/17/2021 16:37    Procedures Procedures    Medications Ordered in ED Medications  iohexol (OMNIPAQUE) 350 MG/ML injection 100 mL (100 mLs Intravenous Contrast Given 05/17/21 1624)    ED Course/ Medical Decision Making/ A&P                           Medical Decision Making  Pt here with complaint of shortness of breath.  Sx's for few days.  No fever, cough, chest pain abdominal pain or vomiting.  No known sick contacts.  Patient has history of A. fib and currently anticoagulated on Eliquis.  On exam, patient well-appearing nontoxic.  He does not appear critically ill.  He has symmetric rise and fall of the chest without increased work of breathing on my exam.  Vital signs are without tachycardia tachypnea or hypoxia.  Differential would include PE, infectious process such as pneumonia, viral process, ACS  EKG shows atrial fibrillation without RVR.  Labs interpreted by me, negative for influenza and COVID.  Delta troponin essentially unchanged.  No leukocytosis.  Patient does have a one-point drop in hemoglobin from 3 days ago.  He notes having some loose stools but denies melena or hematochezia.  Rectal exam does show slightly heme positive stool.  I do not feel that hemoglobin has dropped enough to cause patient's dyspnea.  No electrolyte derangement.  Patient does have  D-dimer elevated at 2.07.  Clinical suspicion for PE is low given that patient is anticoagulated.  Cause for patient's dyspnea at this time is unclear.  Chest x-ray showing chronic lung disease without acute process.  We will proceed with CT angio of the chest  CT angio of the chest shows no evidence of pulmonary embolus.  Radiology imaging reviewed by me and I am agreeable to radiology interpretation  Patient source of dyspnea remains unclear at this time.  Work-up is reassuring, low clinical suspicion for emergent process.  Patient has ambulated in the department without difficulty and maintained oxygen saturations of 93 to 94% on room air.  I feel patient is stable for discharge home at this time, he will follow-up closely with PCP regarding his anemia.  He currently takes a PPI.  Eliquis dosing may need adjustment.  All questions were answered.  Return precautions were discussed.          Final Clinical Impression(s) / ED Diagnoses Final diagnoses:  Shortness of breath  Anemia, unspecified type    Rx / DC Orders ED Discharge Orders     None         Kem Parkinson, PA-C 05/20/21 1538    Hayden Rasmussen, MD 05/24/21 1007

## 2021-05-18 ENCOUNTER — Telehealth (HOSPITAL_BASED_OUTPATIENT_CLINIC_OR_DEPARTMENT_OTHER): Payer: Self-pay | Admitting: *Deleted

## 2021-05-18 NOTE — Progress Notes (Signed)
ED Antimicrobial Stewardship Positive Culture Follow Up   Darrell Lang is an 76 y.o. male who presented to [facility] on 05/14/2021 with a chief complaint of  Chief Complaint  Patient presents with   Fever   Groin Pain    Recent Results (from the past 720 hour(s))  Resp Panel by RT-PCR (Flu A&B, Covid) Nasopharyngeal Swab     Status: None   Collection Time: 05/14/21  1:00 PM   Specimen: Nasopharyngeal Swab; Nasopharyngeal(NP) swabs in vial transport medium  Result Value Ref Range Status   SARS Coronavirus 2 by RT PCR NEGATIVE NEGATIVE Final    Comment: (NOTE) SARS-CoV-2 target nucleic acids are NOT DETECTED.  The SARS-CoV-2 RNA is generally detectable in upper respiratory specimens during the acute phase of infection. The lowest concentration of SARS-CoV-2 viral copies this assay can detect is 138 copies/mL. A negative result does not preclude SARS-Cov-2 infection and should not be used as the sole basis for treatment or other patient management decisions. A negative result may occur with  improper specimen collection/handling, submission of specimen other than nasopharyngeal swab, presence of viral mutation(s) within the areas targeted by this assay, and inadequate number of viral copies(<138 copies/mL). A negative result must be combined with clinical observations, patient history, and epidemiological information. The expected result is Negative.  Fact Sheet for Patients:  EntrepreneurPulse.com.au  Fact Sheet for Healthcare Providers:  IncredibleEmployment.be  This test is no t yet approved or cleared by the [place] FDA and  has been authorized for detection and/or diagnosis of SARS-CoV-2 by FDA under an Emergency Use Authorization (EUA). This EUA will remain  in effect (meaning this test can be used) for the duration of the COVID-19 declaration under Section 564(b)(1) of the Act, 21 U.S.C.section 360bbb-3(b)(1), unless the  authorization is terminated  or revoked sooner.       Influenza A by PCR NEGATIVE NEGATIVE Final   Influenza B by PCR NEGATIVE NEGATIVE Final    Comment: (NOTE) The Xpert Xpress SARS-CoV-2/FLU/RSV plus assay is intended as an aid in the diagnosis of influenza from Nasopharyngeal swab specimens and should not be used as a sole basis for treatment. Nasal washings and aspirates are unacceptable for Xpert Xpress SARS-CoV-2/FLU/RSV testing.  Fact Sheet for Patients: EntrepreneurPulse.com.au  Fact Sheet for Healthcare Providers: IncredibleEmployment.be  This test is not yet approved or cleared by the [place] FDA and has been authorized for detection and/or diagnosis of SARS-CoV-2 by FDA under an Emergency Use Authorization (EUA). This EUA will remain in effect (meaning this test can be used) for the duration of the COVID-19 declaration under Section 564(b)(1) of the Act, 21 U.S.C. section 360bbb-3(b)(1), unless the authorization is terminated or revoked.  Performed at [facility], [address] [place], [place] [address]   Urine Culture     Status: Abnormal   Collection Time: 05/14/21  1:35 PM   Specimen: Urine, Clean Catch  Result Value Ref Range Status   Specimen Description   Final    URINE, CLEAN CATCH Performed at [facility], [address] [place], [place] [address]    Special Requests   Final    Normal Performed at [facility], [address] [place], [place] [address]    Culture >=100,000 COLONIES/mL SERRATIA MARCESCENS (A)  Final   Report Status 05/17/2021 FINAL  Final   Organism ID, Bacteria SERRATIA MARCESCENS (A)  Final      Susceptibility   Serratia marcescens - MIC*    CEFAZOLIN >=64 RESISTANT  Resistant     CEFEPIME <=0.12 SENSITIVE Sensitive     CEFTRIAXONE <=0.25 SENSITIVE Sensitive     CIPROFLOXACIN <=0.25 SENSITIVE Sensitive     GENTAMICIN <=1 SENSITIVE Sensitive     NITROFURANTOIN 128 RESISTANT Resistant      TRIMETH/SULFA <=20 SENSITIVE Sensitive     * >=100,000 COLONIES/mL SERRATIA MARCESCENS  Culture, blood (single)     Status: None (Preliminary result)   Collection Time: 05/14/21  1:49 PM   Specimen: Right Antecubital; Blood  Result Value Ref Range Status   Specimen Description RIGHT ANTECUBITAL BOTTLES DRAWN AEROBIC ONLY  Final   Special Requests Blood Culture adequate volume  Final   Culture   Final    NO GROWTH 4 DAYS Performed at [facility], [address] [place], [place] [address]    Report Status PENDING  Incomplete  Culture, blood (single)     Status: None (Preliminary result)   Collection Time: 05/14/21  3:50 PM   Specimen: BLOOD RIGHT HAND  Result Value Ref Range Status   Specimen Description   Final    BLOOD RIGHT HAND BOTTLES DRAWN AEROBIC AND ANAEROBIC   Special Requests Blood Culture adequate volume  Final   Culture   Final    NO GROWTH 4 DAYS Performed at [facility], [address] [place], [place] [address]    Report Status PENDING  Incomplete  Resp Panel by RT-PCR (Flu A&B, Covid) Nasopharyngeal Swab     Status: None   Collection Time: 05/17/21  1:53 PM   Specimen: Nasopharyngeal Swab; Nasopharyngeal(NP) swabs in vial transport medium  Result Value Ref Range Status   SARS Coronavirus 2 by RT PCR NEGATIVE NEGATIVE Final    Comment: (NOTE) SARS-CoV-2 target nucleic acids are NOT DETECTED.  The SARS-CoV-2 RNA is generally detectable in upper respiratory specimens during the acute phase of infection. The lowest concentration of SARS-CoV-2 viral copies this assay can detect is 138 copies/mL. A negative result does not preclude SARS-Cov-2 infection and should not be used as the sole basis for treatment or other patient management decisions. A negative result may occur with  improper specimen collection/handling, submission of specimen other than nasopharyngeal swab, presence of viral mutation(s) within the areas targeted by this assay, and inadequate number  of viral copies(<138 copies/mL). A negative result must be combined with clinical observations, patient history, and epidemiological information. The expected result is Negative.  Fact Sheet for Patients:  EntrepreneurPulse.com.au  Fact Sheet for Healthcare Providers:  IncredibleEmployment.be  This test is no t yet approved or cleared by the [place] FDA and  has been authorized for detection and/or diagnosis of SARS-CoV-2 by FDA under an Emergency Use Authorization (EUA). This EUA will remain  in effect (meaning this test can be used) for the duration of the COVID-19 declaration under Section 564(b)(1) of the Act, 21 U.S.C.section 360bbb-3(b)(1), unless the authorization is terminated  or revoked sooner.       Influenza A by PCR NEGATIVE NEGATIVE Final   Influenza B by PCR NEGATIVE NEGATIVE Final    Comment: (NOTE) The Xpert Xpress SARS-CoV-2/FLU/RSV plus assay is intended as an aid in the diagnosis of influenza from Nasopharyngeal swab specimens and should not be used as a sole basis for treatment. Nasal washings and aspirates are unacceptable for Xpert Xpress SARS-CoV-2/FLU/RSV testing.  Fact Sheet for Patients: EntrepreneurPulse.com.au  Fact Sheet for Healthcare Providers: IncredibleEmployment.be  This test is not yet approved or cleared by the [place] FDA and has been authorized for detection  and/or diagnosis of SARS-CoV-2 by FDA under an Emergency Use Authorization (EUA). This EUA will remain in effect (meaning this test can be used) for the duration of the COVID-19 declaration under Section 564(b)(1) of the Act, 21 U.S.C. section 360bbb-3(b)(1), unless the authorization is terminated or revoked.  Performed at [facility], [address], [place], [place] [address]    Discussed with provider. While there was initial concern for UTI, patient was recommended by Urology to be treated  with Augmentin due to scrotal hematoma and swelling. Patient returned to ED on 1/3 for shortness of breath, and reported improved scrotal swelling and symptoms. Plan to continue Augmentin.    ED Provider: Alyse Low, PA    Elsie Amis, PharmD Student  05/18/2021, 10:36 AM

## 2021-05-18 NOTE — Telephone Encounter (Signed)
Post ED Visit - Positive Culture Follow-up  Culture report reviewed by antimicrobial stewardship pharmacist: [facility] []  Elenor Quinones, Pharm.D. [x]  Heide Guile, Pharm.D., BCPS AQ-ID []  Parks Neptune, Pharm.D., BCPS []  Alycia Rossetti, Pharm.D., BCPS []  [place]ris [place], Pharm.D., BCPS, AAHIVP []  Legrand Como, Pharm.D., BCPS, AAHIVP []  Salome Arnt, PharmD, BCPS []  Johnnette Gourd, PharmD, BCPS []  Hughes Better, PharmD, BCPS []  Leeroy Cha, PharmD []  Laqueta Linden, PharmD, BCPS []   PharmD  [facility] Team []  Leodis Sias, PharmD []  Lindell Spar, PharmD []  Royetta Asal, PharmD []  Graylin Shiver, Rph []  Rema Fendt) Glennon Mac, PharmD []  Arlyn Dunning, PharmD []  Netta Cedars, PharmD []  Dia Sitter, PharmD []  Leone Haven, PharmD []  Gretta Arab, PharmD []  Theodis Shove, PharmD []  Peggyann Juba, PharmD []  Reuel Boom, PharmD   Positive urine culture Treated with Amoxicillin-Clavulanate, organism sensitive to the same and no further patient follow-up is required at this time per [facility], PA  Rosie Fate 05/18/2021, 11:55 AM

## 2021-05-19 LAB — CULTURE, BLOOD (SINGLE)
Culture: NO GROWTH
Culture: NO GROWTH
Special Requests: ADEQUATE
Special Requests: ADEQUATE

## 2021-06-07 IMAGING — CR DG CHEST 1V PORT
1 series · 1 of 1 positions shown · non-contrast
Comparison: None.

CLINICAL DATA: Shortness of breath

EXAM:
PORTABLE CHEST 1 VIEW

[portable]
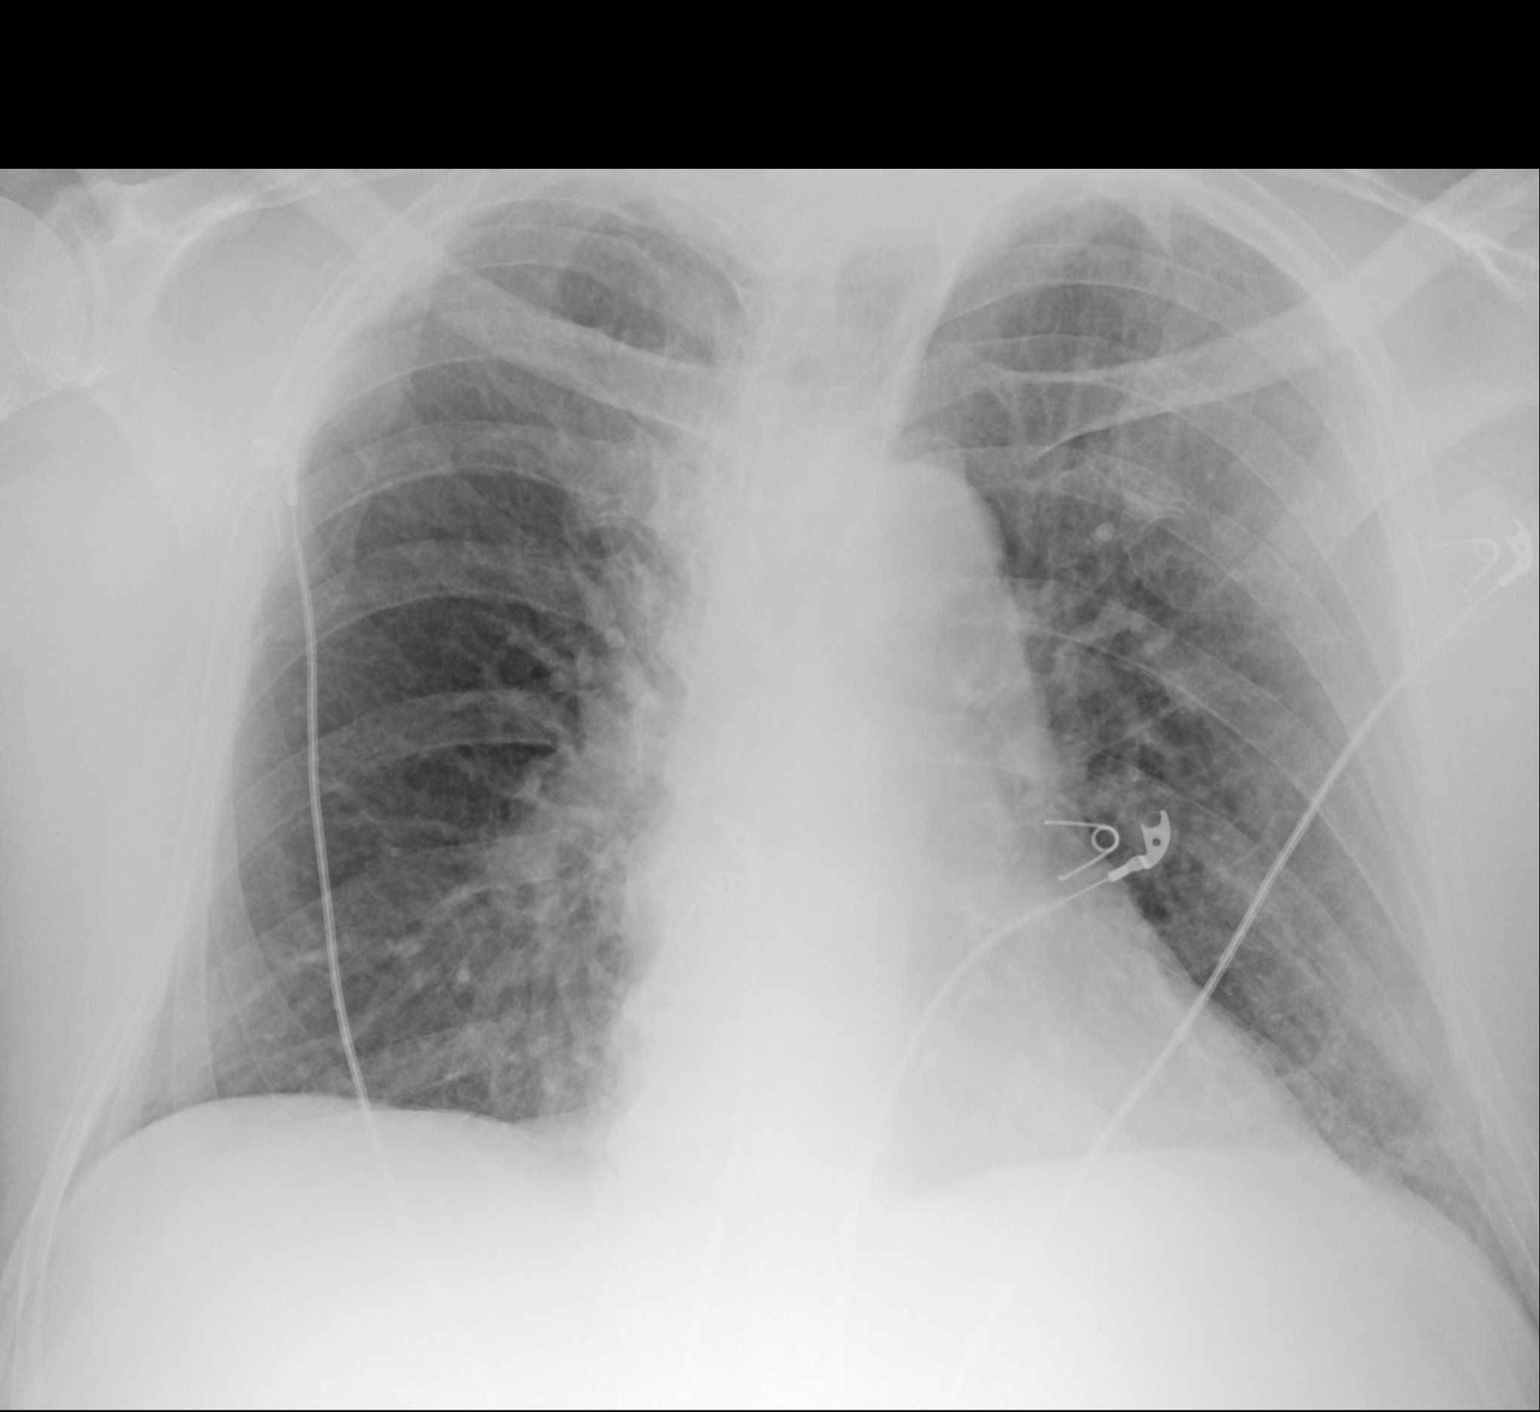

[1 of 1 positions shown; findings below may reference images not displayed]

FINDINGS: No focal opacity or pleural effusion. Borderline cardiomegaly with
central vascular congestion. No pneumothorax.
IMPRESSION: Borderline cardiomegaly with central vascular congestion

## 2021-06-23 ENCOUNTER — Ambulatory Visit (INDEPENDENT_AMBULATORY_CARE_PROVIDER_SITE_OTHER): Payer: No Typology Code available for payment source | Admitting: Gastroenterology

## 2021-06-23 ENCOUNTER — Other Ambulatory Visit: Payer: Self-pay

## 2021-06-23 ENCOUNTER — Encounter: Payer: Self-pay | Admitting: Gastroenterology

## 2021-06-23 ENCOUNTER — Telehealth: Payer: Self-pay | Admitting: *Deleted

## 2021-06-23 DIAGNOSIS — D649 Anemia, unspecified: Secondary | ICD-10-CM

## 2021-06-23 DIAGNOSIS — R195 Other fecal abnormalities: Secondary | ICD-10-CM

## 2021-06-23 MED ORDER — PEG 3350-KCL-NA BICARB-NACL 420 G PO SOLR: ORAL | 0 refills | Status: DC

## 2021-06-23 NOTE — Telephone Encounter (Signed)
Spoke with spouse. Patient scheduled for TCS/EGD with Dr. Abbey Chatters, ASA 3 on 2/20 at 2:15pm. Aware pt needs to hold eliquis 48 hrs prior. Advised will call back with pre-op appt.

## 2021-06-23 NOTE — Telephone Encounter (Signed)
LMOVM with pre-op appt details

## 2021-06-23 NOTE — H&P (View-Only) (Signed)
Primary Care Physician:  [facility] Referring Physician: [facility] Primary Gastroenterologist:  Dr. Gala Romney  Chief Complaint  Patient presents with   Anemia    [facility]. Stool has been dark but better now   Diarrhea    Over 1 month    HPI:   Darrell Lang is a 76 y.o. male presenting [facility] due [facility]. Last colonoscopy in 2015 was normal. He is on Eliquis with history of afib. Reviewed labs, with Hgb 13 at the [place] in August 2022. ED presentation early January with Hgb 9.9. Dec Hgb 11.2. Heme positive in ED. He had presented with shortness of breath. CTA without PE. Presents today for further evaluation.   He is a difficult historian. Saw onset of black stool in December through the first of February. No further black stool since beginning of February. No bright red blood per rectum.   Watery stool comes and goes. Sometimes has no BM and sometimes 2-3 times per day. Sometimes runny, sometimes not. States stool habits are chronic. Sometimes feels constipated. Antibiotics in December for UTI. Has abdominal cramping with urgency. No weight loss. Good appetite. No dysphagia. No N/V.   Has taken Ibuprofen as needed for a few days last month, otherwise none. Saw black stool prior to this. On omeprazole once daily for GERD. No prior EGD. Sometimes feels tired, sometimes not.   Past Medical History:  Diagnosis Date   Anxiety and depression    Atrial fibrillation with RVR ([place]) 02/08/2019   COVID-19 virus infection 02/09/2019   Diabetes ([place])    Encounter for screening colonoscopy 07/28/2013   Essential hypertension 02/09/2019   Hyperlipidemia 02/09/2019   Hypertension    Hypertriglyceridemia     Past Surgical History:  Procedure Laterality Date   APPENDECTOMY     6th grade   COLONOSCOPY  01/14/2004   Dr. Gala Romney: internal hemorrhoids, otherwise normal rectum. Normal colon and normal TI   COLONOSCOPY N/A 08/06/2013   normal,  internal hemorrhoids    Current Outpatient Medications  Medication Sig Dispense Refill   amLODipine (NORVASC) 5 MG tablet Take 1 tablet (5 mg total) by mouth daily. 90 tablet 2   apixaban (ELIQUIS) 5 MG TABS tablet Take 1 tablet (5 mg total) by mouth 2 (two) times daily. 180 tablet 3   atorvastatin (LIPITOR) 10 MG tablet Take 10 mg by mouth daily.     benzonatate (TESSALON) 200 MG capsule Take 1 capsule (200 mg total) by mouth 3 (three) times daily as needed for cough. 30 capsule 0   benztropine (COGENTIN) 1 MG tablet Take 1 mg by mouth at bedtime.     busPIRone (BUSPAR) 5 MG tablet Take 2.5 mg by mouth daily. Anxiety     carboxymethylcellulose (REFRESH PLUS) 0.5 % SOLN Place 1 drop into both eyes daily as needed (dry eye).     Carboxymethylcellulose Sodium 1 % GEL Place 1 drop into both eyes as needed (eye glued togeather).     Cholecalciferol 50 MCG (2000 UT) TABS Take 1 tablet by mouth daily.     citalopram (CELEXA) 20 MG tablet Take 20 mg by mouth daily.     Cyanocobalamin (B-12 PO) Take 1 tablet by mouth daily.     Eyelid Cleansers (AVENOVA) 0.01 % SOLN Place 1 spray into both eyes in the morning and at bedtime.     finasteride (PROSCAR) 5 MG tablet Take 5 mg by mouth daily.  gabapentin (NEURONTIN) 300 MG capsule Take 1 capsule by mouth 2 (two) times daily.     glipiZIDE (GLUCOTROL) 5 MG tablet Take 5 mg by mouth See admin instructions. Take 5 mg in the morning and 2.5 mg every evening     glucose-Vitamin C 4-0.006 GM CHEW chewable tablet as needed.     losartan (COZAAR) 100 MG tablet Take 100 mg by mouth daily.     Magnesium Oxide 420 MG TABS Take 1 tablet by mouth daily.     metFORMIN (GLUCOPHAGE) 1000 MG tablet Take by mouth 2 (two) times daily with a meal. 1000mg  AM; 1500mg  PM     methimazole (TAPAZOLE) 5 MG tablet Take 1 tablet (5 mg total) by mouth daily. Hyperthyroidism: E05.90 90 tablet 0   metoprolol tartrate (LOPRESSOR) 25 MG tablet Take 1 tablet (25 mg total) by mouth 2  (two) times daily. 60 tablet 0   Multiple Vitamin (MULTIVITAMIN) tablet Take 1 tablet by mouth daily.     omeprazole (PRILOSEC) 20 MG capsule Take 20 mg by mouth daily.     QUEtiapine (SEROQUEL) 200 MG tablet Take 50 mg by mouth at bedtime.     risperiDONE (RISPERDAL) 1 MG tablet Take 1 mg by mouth at bedtime.     Semaglutide,0.25 or 0.5MG /DOS, 2 MG/1.5ML SOPN Inject 0.5 mg into the skin once a week. Monday. Takes for diabetes     simvastatin (ZOCOR) 80 MG tablet Take 40 mg by mouth at bedtime.      tamsulosin (FLOMAX) 0.4 MG CAPS capsule Take 0.8 mg by mouth at bedtime.     vitamin B-12 (CYANOCOBALAMIN) 500 MCG tablet Take 2 tablets by mouth 2 (two) times daily.     clonazePAM (KLONOPIN) 0.5 MG tablet Take 0.5 mg by mouth 2 (two) times daily.  (Patient not taking: Reported on 05/14/2021)     fluticasone (FLONASE) 50 MCG/ACT nasal spray Place 2 sprays into both nostrils daily. (Patient not taking: Reported on 05/14/2021) 16 g 0   haloperidol (HALDOL) 2 MG tablet Take 1 tablet by mouth every evening. (Patient not taking: Reported on 05/14/2021)     No current facility-administered medications for this visit.    Allergies as of 06/23/2021 - Review Complete 06/23/2021  Allergen Reaction Noted   Empagliflozin  06/01/2020   Lisinopril Cough 08/24/2009   Sulfamethoxazole-trimethoprim  05/29/2011    Family History  Problem Relation Age of Onset   Stroke Father    Colon cancer Neg Hx    Thyroid disease Neg Hx    Colon polyps Neg Hx     Social History   Socioeconomic History   Marital status: Married    Spouse name: Not on file   Number of children: Not on file   Years of education: Not on file   Highest education level: Not on file  Occupational History   Not on file  Tobacco Use   Smoking status: Former    Packs/day: 0.00    Years: 26.00    Pack years: 0.00    Types: Cigarettes   Smokeless tobacco: Never   Tobacco comments:    Quit smoking in 1980s  Substance and Sexual  Activity   Alcohol use: No    Comment: history of ETOH abuse in remote past, none currently.    Drug use: No   Sexual activity: Not on file  Other Topics Concern   Not on file  Social History Narrative   Not on file   Social Determinants of Health  Financial Resource Strain: Not on file  Food Insecurity: Not on file  Transportation Needs: Not on file  Physical Activity: Not on file  Stress: Not on file  Social Connections: Not on file  Intimate Partner Violence: Not on file    Review of Systems: Gen: Denies any fever, chills, fatigue, weight loss, lack of appetite.  CV: +palpitations Resp: mild DOE GI: see HPI GU : Denies urinary burning, urinary frequency, urinary hesitancy MS: Denies joint pain, muscle weakness, cramps, or limitation of movement.  Derm: Denies rash, itching, dry skin Psych: Denies depression, anxiety, memory loss, and confusion Heme: see HPI  Physical Exam: BP 133/66    Pulse 84    Temp (!) 97.5 F (36.4 C) (Temporal)    Ht 5\' 11"  (1.803 m)    Wt 191 lb (86.6 kg)    BMI 26.64 kg/m  General:   Alert and oriented. Pleasant and cooperative. Well-nourished and well-developed.  Head:  Normocephalic and atraumatic. Eyes:  Without icterus, sclera clear and conjunctiva pink.  Ears:  Normal auditory acuity. Mouth:  mask in place Lungs:  Clear to auscultation bilaterally.  Heart:  S1, S2 present irregularly irregular Abdomen:  +BS, round but soft, non-tender. No HSM noted. No guarding or rebound. Small umbilical hernia.  Rectal:  Deferred  Msk:  Symmetrical without gross deformities.  Extremities:  Without edema. Neurologic:  Alert and  oriented x4 Skin:  Intact without significant lesions or rashes. Psych:  Alert and cooperative. Flat affect  ASSESSMENT: Darrell Lang is a 77 y.o. male [facility] due to new onset anemia. Labs reviewed with Hgb 13 in August 2022, 11.2 in December, and most recently 9.9 in the ED in Jan  2023. Heme positive per ED report. He is chronically anticoagulated on Eliquis for afib.  Anemia: drifting Hgb and reported black stool from December through early February although now resolved. In setting of Eliquis, bleeding could be from anywhere in GI tract but suspect UGI source. He did report Ibuprofen use recently although black stool was prior to this. Will update CBC and iron studies now. Arrange colonoscopy/EGD, as last colonoscopy in 2015. No prior EGD. Doubt right-sided colonic lesion but unable to exclude.  Alternating bowel habits: with constipation and diarrhea. This is chronic. Limited historian. Unclear which is predominant. Doubt any infectious process as diarrhea not persistent and due to chronicity. He is to call if persistent diarrhea. Can use Benefiber daily. Query if he has an overflow diarrhea.   GERD: managed on omeprazole daily.    PLAN: CBC and iron studies now Proceed with colonoscopy/EGD by Dr. Abbey Chatters in near future: the risks, benefits, and alternatives have been discussed with the patient in detail. The patient states understanding and desires to proceed. Hold Eliquis 48 hours prior May need capsule if colonoscopy/EGD unrevealing Further recommendations to follow   Annitta Needs, PhD, ANP-BC [facility]

## 2021-06-23 NOTE — Progress Notes (Signed)
Primary Care Physician:  [facility] Referring Physician: [facility] Primary Gastroenterologist:  Dr. Gala Romney  Chief Complaint  Patient presents with   Anemia    [facility] requesting t[facility]egd. Stool has been dark but better now   Diarrhea    Over 1 month    HPI:   Darrell Lang is a 76 y.o. male presenting today [facility] of [facility] due to [facility]. Last colonoscopy in 2015 was normal. He is on Eliquis with history of afib. Reviewed labs, with Hgb 13 at the [place] in August 2022. ED presentation early January with Hgb 9.9. Dec Hgb 11.2. Heme positive in ED. He had presented with shortness of breath. CTA without PE. Presents today for further evaluation.   He is a difficult historian. Saw onset of black stool in December through the first of February. No further black stool since beginning of February. No bright red blood per rectum.   Watery stool comes and goes. Sometimes has no BM and sometimes 2-3 times per day. Sometimes runny, sometimes not. States stool habits are chronic. Sometimes feels constipated. Antibiotics in December for UTI. Has abdominal cramping with urgency. No weight loss. Good appetite. No dysphagia. No N/V.   Has taken Ibuprofen as needed for a few days last month, otherwise none. Saw black stool prior to this. On omeprazole once daily for GERD. No prior EGD. Sometimes feels tired, sometimes not.   Past Medical History:  Diagnosis Date   Anxiety and depression    Atrial fibrillation with RVR ([place]) 02/08/2019   COVID-19 virus infection 02/09/2019   Diabetes ([place])    Encounter for screening colonoscopy 07/28/2013   Essential hypertension 02/09/2019   Hyperlipidemia 02/09/2019   Hypertension    Hypertriglyceridemia     Past Surgical History:  Procedure Laterality Date   APPENDECTOMY     6th grade   COLONOSCOPY  01/14/2004   Dr. Gala Romney: internal hemorrhoids, otherwise normal rectum. Normal colon and normal TI   COLONOSCOPY N/A 08/06/2013   normal,  internal hemorrhoids    Current Outpatient Medications  Medication Sig Dispense Refill   amLODipine (NORVASC) 5 MG tablet Take 1 tablet (5 mg total) by mouth daily. 90 tablet 2   apixaban (ELIQUIS) 5 MG TABS tablet Take 1 tablet (5 mg total) by mouth 2 (two) times daily. 180 tablet 3   atorvastatin (LIPITOR) 10 MG tablet Take 10 mg by mouth daily.     benzonatate (TESSALON) 200 MG capsule Take 1 capsule (200 mg total) by mouth 3 (three) times daily as needed for cough. 30 capsule 0   benztropine (COGENTIN) 1 MG tablet Take 1 mg by mouth at bedtime.     busPIRone (BUSPAR) 5 MG tablet Take 2.5 mg by mouth daily. Anxiety     carboxymethylcellulose (REFRESH PLUS) 0.5 % SOLN Place 1 drop into both eyes daily as needed (dry eye).     Carboxymethylcellulose Sodium 1 % GEL Place 1 drop into both eyes as needed (eye glued togeather).     Cholecalciferol 50 MCG (2000 UT) TABS Take 1 tablet by mouth daily.     citalopram (CELEXA) 20 MG tablet Take 20 mg by mouth daily.     Cyanocobalamin (B-12 PO) Take 1 tablet by mouth daily.     Eyelid Cleansers (AVENOVA) 0.01 % SOLN Place 1 spray into both eyes in the morning and at bedtime.     finasteride (PROSCAR) 5 MG tablet Take 5 mg by mouth daily.  gabapentin (NEURONTIN) 300 MG capsule Take 1 capsule by mouth 2 (two) times daily.     glipiZIDE (GLUCOTROL) 5 MG tablet Take 5 mg by mouth See admin instructions. Take 5 mg in the morning and 2.5 mg every evening     glucose-Vitamin C 4-0.006 GM CHEW chewable tablet as needed.     losartan (COZAAR) 100 MG tablet Take 100 mg by mouth daily.     Magnesium Oxide 420 MG TABS Take 1 tablet by mouth daily.     metFORMIN (GLUCOPHAGE) 1000 MG tablet Take by mouth 2 (two) times daily with a meal. 1000mg  AM; 1500mg  PM     methimazole (TAPAZOLE) 5 MG tablet Take 1 tablet (5 mg total) by mouth daily. Hyperthyroidism: E05.90 90 tablet 0   metoprolol tartrate (LOPRESSOR) 25 MG tablet Take 1 tablet (25 mg total) by mouth 2  (two) times daily. 60 tablet 0   Multiple Vitamin (MULTIVITAMIN) tablet Take 1 tablet by mouth daily.     omeprazole (PRILOSEC) 20 MG capsule Take 20 mg by mouth daily.     QUEtiapine (SEROQUEL) 200 MG tablet Take 50 mg by mouth at bedtime.     risperiDONE (RISPERDAL) 1 MG tablet Take 1 mg by mouth at bedtime.     Semaglutide,0.25 or 0.5MG /DOS, 2 MG/1.5ML SOPN Inject 0.5 mg into the skin once a week. Monday. Takes for diabetes     simvastatin (ZOCOR) 80 MG tablet Take 40 mg by mouth at bedtime.      tamsulosin (FLOMAX) 0.4 MG CAPS capsule Take 0.8 mg by mouth at bedtime.     vitamin B-12 (CYANOCOBALAMIN) 500 MCG tablet Take 2 tablets by mouth 2 (two) times daily.     clonazePAM (KLONOPIN) 0.5 MG tablet Take 0.5 mg by mouth 2 (two) times daily.  (Patient not taking: Reported on 05/14/2021)     fluticasone (FLONASE) 50 MCG/ACT nasal spray Place 2 sprays into both nostrils daily. (Patient not taking: Reported on 05/14/2021) 16 g 0   haloperidol (HALDOL) 2 MG tablet Take 1 tablet by mouth every evening. (Patient not taking: Reported on 05/14/2021)     No current facility-administered medications for this visit.    Allergies as of 06/23/2021 - Review Complete 06/23/2021  Allergen Reaction Noted   Empagliflozin  06/01/2020   Lisinopril Cough 08/24/2009   Sulfamethoxazole-trimethoprim  05/29/2011    Family History  Problem Relation Age of Onset   Stroke Father    Colon cancer Neg Hx    Thyroid disease Neg Hx    Colon polyps Neg Hx     Social History   Socioeconomic History   Marital status: Married    Spouse name: Not on file   Number of children: Not on file   Years of education: Not on file   Highest education level: Not on file  Occupational History   Not on file  Tobacco Use   Smoking status: Former    Packs/day: 0.00    Years: 26.00    Pack years: 0.00    Types: Cigarettes   Smokeless tobacco: Never   Tobacco comments:    Quit smoking in 1980s  Substance and Sexual  Activity   Alcohol use: No    Comment: history of ETOH abuse in remote past, none currently.    Drug use: No   Sexual activity: Not on file  Other Topics Concern   Not on file  Social History Narrative   Not on file   Social Determinants of Health  Financial Resource Strain: Not on file  Food Insecurity: Not on file  Transportation Needs: Not on file  Physical Activity: Not on file  Stress: Not on file  Social Connections: Not on file  Intimate Partner Violence: Not on file    Review of Systems: Gen: Denies any fever, chills, fatigue, weight loss, lack of appetite.  CV: +palpitations Resp: mild DOE GI: see HPI GU : Denies urinary burning, urinary frequency, urinary hesitancy MS: Denies joint pain, muscle weakness, cramps, or limitation of movement.  Derm: Denies rash, itching, dry skin Psych: Denies depression, anxiety, memory loss, and confusion Heme: see HPI  Physical Exam: BP 133/66    Pulse 84    Temp (!) 97.5 F (36.4 C) (Temporal)    Ht 5\' 11"  (1.803 m)    Wt 191 lb (86.6 kg)    BMI 26.64 kg/m  General:   Alert and oriented. Pleasant and cooperative. Well-nourished and well-developed.  Head:  Normocephalic and atraumatic. Eyes:  Without icterus, sclera clear and conjunctiva pink.  Ears:  Normal auditory acuity. Mouth:  mask in place Lungs:  Clear to auscultation bilaterally.  Heart:  S1, S2 present irregularly irregular Abdomen:  +BS, round but soft, non-tender. No HSM noted. No guarding or rebound. Small umbilical hernia.  Rectal:  Deferred  Msk:  Symmetrical without gross deformities.  Extremities:  Without edema. Neurologic:  Alert and  oriented x4 Skin:  Intact without significant lesions or rashes. Psych:  Alert and cooperative. Flat affect  ASSESSMENT: Darrell Lang is a 76 y.o. male [facility]. Labs reviewed with Hgb 13 in August 2022, 11.2 in December, and most recently 9.9 in the ED in Jan  2023. Heme positive per ED report. He is chronically anticoagulated on Eliquis for afib.  Anemia: drifting Hgb and reported black stool from December through early February although now resolved. In setting of Eliquis, bleeding could be from anywhere in GI tract but suspect UGI source. He did report Ibuprofen use recently although black stool was prior to this. Will update CBC and iron studies now. Arrange colonoscopy/EGD, as last colonoscopy in 2015. No prior EGD. Doubt right-sided colonic lesion but unable to exclude.  Alternating bowel habits: with constipation and diarrhea. This is chronic. Limited historian. Unclear which is predominant. Doubt any infectious process as diarrhea not persistent and due to chronicity. He is to call if persistent diarrhea. Can use Benefiber daily. Query if he has an overflow diarrhea.   GERD: managed on omeprazole daily.    PLAN: CBC and iron studies now Proceed with colonoscopy/EGD by Dr. Abbey Chatters in near future: the risks, benefits, and alternatives have been discussed with the patient in detail. The patient states understanding and desires to proceed. Hold Eliquis 48 hours prior May need capsule if colonoscopy/EGD unrevealing Further recommendations to follow   Annitta Needs, PhD, ANP-BC [facility]

## 2021-06-23 NOTE — Patient Instructions (Signed)
Please have blood work done today.  We are arranging a colonoscopy and upper endoscopy in the near future with Dr. Abbey Chatters.  Please stop Eliquis 2 days before the procedure.  Please call if abdominal pain, black stool, bright red blood, weakness.  Further recommendations to follow!  It was a pleasure to see you today. I want to create trusting relationships with patients to provide genuine, compassionate, and quality care. I value your feedback. If you receive a survey regarding your visit,  I greatly appreciate you taking time to fill this out.   Darrell Needs, PhD, ANP-BC [facility] Gastroenter[facility]

## 2021-06-24 LAB — CBC WITH DIFFERENTIAL/PLATELET
Absolute Monocytes: 723 cells/uL (ref 200–950)
Basophils Absolute: 102 cells/uL (ref 0–200)
Basophils Relative: 0.9 %
Eosinophils Absolute: 283 cells/uL (ref 15–500)
Eosinophils Relative: 2.5 %
HCT: 31.1 % — ABNORMAL LOW (ref 38.5–50.0)
Hemoglobin: 10 g/dL — ABNORMAL LOW (ref 13.2–17.1)
Lymphs Abs: 1345 cells/uL (ref 850–3900)
MCH: 25.9 pg — ABNORMAL LOW (ref 27.0–33.0)
MCHC: 32.2 g/dL (ref 32.0–36.0)
MCV: 80.6 fL (ref 80.0–100.0)
MPV: 9.8 fL (ref 7.5–12.5)
Monocytes Relative: 6.4 %
Neutro Abs: 8848 cells/uL — ABNORMAL HIGH (ref 1500–7800)
Neutrophils Relative %: 78.3 %
Platelets: 281 10*3/uL (ref 140–400)
RBC: 3.86 10*6/uL — ABNORMAL LOW (ref 4.20–5.80)
RDW: 13.5 % (ref 11.0–15.0)
Total Lymphocyte: 11.9 %
WBC: 11.3 10*3/uL — ABNORMAL HIGH (ref 3.8–10.8)

## 2021-06-24 LAB — IRON,TIBC AND FERRITIN PANEL
%SAT: 8 % (calc) — ABNORMAL LOW (ref 20–48)
Ferritin: 27 ng/mL (ref 24–380)
Iron: 26 ug/dL — ABNORMAL LOW (ref 50–180)
TIBC: 311 mcg/dL (calc) (ref 250–425)

## 2021-06-28 NOTE — Patient Instructions (Signed)
Darrell Lang  06/28/2021     @PREFPERIOPPHARMACY @   Your procedure is scheduled on  07/04/2021.   Report to [facility] at  1215  P.M.   Call this number if you have problems the morning of surgery:  [phone]   Remember:  Follow the diet and prep instructions given to you by the office.    Your last dose of eliquis should be on 07/01/2021.    DO NOT take any medications for diabetes the morning of your procedure.    Take these medicines the morning of surgery with A SIP OF WATER           amlodipine, buspar, celexa, proscar, gabapentin, tapazole, metoprolol, prilosec.     Do not wear jewelry, make-up or nail polish.  Do not wear lotions, powders, or perfumes, or deodorant.  Do not shave 48 hours prior to surgery.  Men may shave face and neck.  Do not bring valuables to the hospital.  [facility] is not responsible for any belongings or valuables.  Contacts, dentures or bridgework may not be worn into surgery.  Leave your suitcase in the car.  After surgery it may be brought to your room.  For patients admitted to the hospital, discharge time will be determined by your treatment team.  Patients discharged the day of surgery will not be allowed to drive home and must have someone with them for 24 hours.    Special instructions:   DO NOT smoke tobacco or vape for 24 hours before your procedure.  Please read over the following fact sheets that you were given. Anesthesia Post-op Instructions and Care and Recovery After Surgery      Colonoscopy, Adult, Care After This sheet gives you information about how to care for yourself after your procedure. Your health care provider may also give you more specific instructions. If you have problems or questions, contact your health care provider. What can I expect after the procedure? After the procedure, it is common to have: A small amount of blood in your stool for 24 hours after the procedure. Some gas. Mild  cramping or bloating of your abdomen. Follow these instructions at home: Eating and drinking  Drink enough fluid to keep your urine pale yellow. Follow instructions from your health care provider about eating or drinking restrictions. Resume your normal diet as instructed by your health care provider. Avoid heavy or fried foods that are hard to digest. Activity Rest as told by your health care provider. Avoid sitting for a long time without moving. Get up to take short walks every 1-2 hours. This is important to improve blood flow and breathing. Ask for help if you feel weak or unsteady. Return to your normal activities as told by your health care provider. Ask your health care provider what activities are safe for you. Managing cramping and bloating  Try walking around when you have cramps or feel bloated. Apply heat to your abdomen as told by your health care provider. Use the heat source that your health care provider recommends, such as a moist heat pack or a heating pad. Place a towel between your skin and the heat source. Leave the heat on for 20-30 minutes. Remove the heat if your skin turns bright red. This is especially important if you are unable to feel pain, heat, or cold. You may have a greater risk of getting burned. General instructions If you were given a sedative during the procedure,  it can affect you for several hours. Do not drive or operate machinery until your health care provider says that it is safe. For the first 24 hours after the procedure: Do not sign important documents. Do not drink alcohol. Do your regular daily activities at a slower pace than normal. Eat soft foods that are easy to digest. Take over-the-counter and prescription medicines only as told by your health care provider. Keep all follow-up visits as told by your health care provider. This is important. Contact a health care provider if: You have blood in your stool 2-3 days after the  procedure. Get help right away if you have: More than a small spotting of blood in your stool. Large blood clots in your stool. Swelling of your abdomen. Nausea or vomiting. A fever. Increasing pain in your abdomen that is not relieved with medicine. Summary After the procedure, it is common to have a small amount of blood in your stool. You may also have mild cramping and bloating of your abdomen. If you were given a sedative during the procedure, it can affect you for several hours. Do not drive or operate machinery until your health care provider says that it is safe. Get help right away if you have a lot of blood in your stool, nausea or vomiting, a fever, or increased pain in your abdomen. This information is not intended to replace advice given to you by your health care provider. Make sure you discuss any questions you have with your health care provider. Document Revised: 03/07/2019 Document Reviewed: 11/25/2018 Elsevier Patient Education  [place] After This sheet gives you information about how to care for yourself after your procedure. Your health care provider may also give you more specific instructions. If you have problems or questions, contact your health care provider. What can I expect after the procedure? After the procedure, it is common to have: Tiredness. Forgetfulness about what happened after the procedure. Impaired judgment for important decisions. Nausea or vomiting. Some difficulty with balance. Follow these instructions at home: For the time period you were told by your health care provider:   Rest as needed. Do not participate in activities where you could fall or become injured. Do not drive or use machinery. Do not drink alcohol. Do not take sleeping pills or medicines that cause drowsiness. Do not make important decisions or sign legal documents. Do not take care of children on your own. Eating and  drinking Follow the diet that is recommended by your health care provider. Drink enough fluid to keep your urine pale yellow. If you vomit: Drink water, juice, or soup when you can drink without vomiting. Make sure you have little or no nausea before eating solid foods. General instructions Have a responsible adult stay with you for the time you are told. It is important to have someone help care for you until you are awake and alert. Take over-the-counter and prescription medicines only as told by your health care provider. If you have sleep apnea, surgery and certain medicines can increase your risk for breathing problems. Follow instructions from your health care provider about wearing your sleep device: Anytime you are sleeping, including during daytime naps. While taking prescription pain medicines, sleeping medicines, or medicines that make you drowsy. Avoid smoking. Keep all follow-up visits as told by your health care provider. This is important. Contact a health care provider if: You keep feeling nauseous or you keep vomiting. You feel light-headed. You are  still sleepy or having trouble with balance after 24 hours. You develop a rash. You have a fever. You have redness or swelling around the IV site. Get help right away if: You have trouble breathing. You have new-onset confusion at home. Summary For several hours after your procedure, you may feel tired. You may also be forgetful and have poor judgment. Have a responsible adult stay with you for the time you are told. It is important to have someone help care for you until you are awake and alert. Rest as told. Do not drive or operate machinery. Do not drink alcohol or take sleeping pills. Get help right away if you have trouble breathing, or if you suddenly become confused. This information is not intended to replace advice given to you by your health care provider. Make sure you discuss any questions you have with your  health care provider. Document Revised: 01/15/2020 Document Reviewed: 04/03/2019 Elsevier Patient Education  2022 [employer].

## 2021-06-29 ENCOUNTER — Encounter (HOSPITAL_COMMUNITY): Payer: Self-pay

## 2021-06-29 ENCOUNTER — Encounter (HOSPITAL_COMMUNITY)
Admission: RE | Admit: 2021-06-29 | Discharge: 2021-06-29 | Disposition: A | Discharge status: Home / Self Care | Payer: Medicare Other | Source: Ambulatory Visit | Attending: Internal Medicine | Admitting: Internal Medicine

## 2021-06-29 HISTORY — DX: Family history of other specified conditions: Z84.89

## 2021-07-04 ENCOUNTER — Ambulatory Visit (HOSPITAL_COMMUNITY)
Admission: RE | Admit: 2021-07-04 | Discharge: 2021-07-04 | Disposition: A | Discharge status: Home / Self Care | Payer: No Typology Code available for payment source | Source: Ambulatory Visit | Attending: Internal Medicine | Admitting: Internal Medicine

## 2021-07-04 ENCOUNTER — Ambulatory Visit (HOSPITAL_COMMUNITY): Payer: No Typology Code available for payment source | Admitting: Anesthesiology

## 2021-07-04 ENCOUNTER — Other Ambulatory Visit: Payer: Self-pay

## 2021-07-04 ENCOUNTER — Encounter (HOSPITAL_COMMUNITY): Payer: Self-pay

## 2021-07-04 ENCOUNTER — Encounter (HOSPITAL_COMMUNITY)
Admission: RE | Disposition: A | Discharge status: Home / Self Care | Payer: Self-pay | Source: Ambulatory Visit | Attending: Internal Medicine

## 2021-07-04 ENCOUNTER — Ambulatory Visit (HOSPITAL_BASED_OUTPATIENT_CLINIC_OR_DEPARTMENT_OTHER): Payer: No Typology Code available for payment source | Admitting: Anesthesiology

## 2021-07-04 DIAGNOSIS — Z79899 Other long term (current) drug therapy: Secondary | ICD-10-CM | POA: Diagnosis not present

## 2021-07-04 DIAGNOSIS — I10 Essential (primary) hypertension: Secondary | ICD-10-CM | POA: Insufficient documentation

## 2021-07-04 DIAGNOSIS — Z7984 Long term (current) use of oral hypoglycemic drugs: Secondary | ICD-10-CM | POA: Insufficient documentation

## 2021-07-04 DIAGNOSIS — F32A Depression, unspecified: Secondary | ICD-10-CM | POA: Diagnosis not present

## 2021-07-04 DIAGNOSIS — K5730 Diverticulosis of large intestine without perforation or abscess without bleeding: Secondary | ICD-10-CM | POA: Diagnosis not present

## 2021-07-04 DIAGNOSIS — I4891 Unspecified atrial fibrillation: Secondary | ICD-10-CM | POA: Insufficient documentation

## 2021-07-04 DIAGNOSIS — E119 Type 2 diabetes mellitus without complications: Secondary | ICD-10-CM | POA: Insufficient documentation

## 2021-07-04 DIAGNOSIS — D509 Iron deficiency anemia, unspecified: Secondary | ICD-10-CM | POA: Insufficient documentation

## 2021-07-04 DIAGNOSIS — Z7901 Long term (current) use of anticoagulants: Secondary | ICD-10-CM | POA: Insufficient documentation

## 2021-07-04 DIAGNOSIS — K2970 Gastritis, unspecified, without bleeding: Secondary | ICD-10-CM

## 2021-07-04 DIAGNOSIS — D122 Benign neoplasm of ascending colon: Secondary | ICD-10-CM | POA: Insufficient documentation

## 2021-07-04 DIAGNOSIS — F419 Anxiety disorder, unspecified: Secondary | ICD-10-CM | POA: Diagnosis not present

## 2021-07-04 DIAGNOSIS — K648 Other hemorrhoids: Secondary | ICD-10-CM

## 2021-07-04 DIAGNOSIS — K635 Polyp of colon: Secondary | ICD-10-CM | POA: Diagnosis not present

## 2021-07-04 DIAGNOSIS — Z87891 Personal history of nicotine dependence: Secondary | ICD-10-CM | POA: Insufficient documentation

## 2021-07-04 HISTORY — PX: POLYPECTOMY: SHX5525

## 2021-07-04 HISTORY — PX: ESOPHAGOGASTRODUODENOSCOPY (EGD) WITH PROPOFOL: SHX5813

## 2021-07-04 HISTORY — PX: COLONOSCOPY WITH PROPOFOL: SHX5780

## 2021-07-04 LAB — GLUCOSE, CAPILLARY: Glucose-Capillary: 148 mg/dL — ABNORMAL HIGH (ref 70–99)

## 2021-07-04 SURGERY — COLONOSCOPY WITH PROPOFOL
Anesthesia: General

## 2021-07-04 MED ORDER — PROPOFOL 10 MG/ML IV BOLUS
INTRAVENOUS | Status: DC | PRN
  Administered 2021-07-04: 50 mg via INTRAVENOUS
  Administered 2021-07-04: 100 mg via INTRAVENOUS

## 2021-07-04 MED ORDER — LACTATED RINGERS IV SOLN: INTRAVENOUS | Status: DC

## 2021-07-04 MED ORDER — LIDOCAINE HCL (CARDIAC) PF 50 MG/5ML IV SOSY
PREFILLED_SYRINGE | INTRAVENOUS | Status: DC | PRN
  Administered 2021-07-04: 50 mg via INTRAVENOUS

## 2021-07-04 MED ORDER — PROPOFOL 500 MG/50ML IV EMUL
INTRAVENOUS | Status: DC | PRN
  Administered 2021-07-04: 75 ug/kg/min via INTRAVENOUS

## 2021-07-04 NOTE — Transfer of Care (Signed)
Immediate Anesthesia Transfer of Care Note  Patient: Darrell Lang  Procedure(s) Performed: COLONOSCOPY WITH PROPOFOL ESOPHAGOGASTRODUODENOSCOPY (EGD) WITH PROPOFOL POLYPECTOMY  Patient Location: Short Stay  Anesthesia Type:MAC  Level of Consciousness: awake and patient cooperative  Airway & Oxygen Therapy: Patient Spontanous Breathing  Post-op Assessment: Report given to RN and Post -op Vital signs reviewed and stable  Post vital signs: Reviewed and stable  Last Vitals:  Vitals Value Taken Time  BP 110/72 07/04/21 1400  Temp 36.8 C 07/04/21 1400  Pulse 78 07/04/21 1400  Resp 23 07/04/21 1400  SpO2 98 % 07/04/21 1400    Last Pain:  Vitals:   07/04/21 1400  TempSrc: Oral  PainSc: 0-No pain         Complications: No notable events documented.

## 2021-07-04 NOTE — Interval H&P Note (Signed)
History and Physical Interval Note:  07/04/2021 1:04 PM  Darrell Lang  has presented today for surgery, with the diagnosis of ANEMIA.  The various methods of treatment have been discussed with the patient and family. After consideration of risks, benefits and other options for treatment, the patient has consented to  Procedure(s) with comments: COLONOSCOPY WITH PROPOFOL (N/A) - 2:15pm ESOPHAGOGASTRODUODENOSCOPY (EGD) WITH PROPOFOL (N/A) as a surgical intervention.  The patient's history has been reviewed, patient examined, no change in status, stable for surgery.  I have reviewed the patient's chart and labs.  Questions were answered to the patient's satisfaction.     [name]

## 2021-07-04 NOTE — Op Note (Signed)
[facility] Patient Name: Darrell Lang Procedure Date: 07/04/2021 1:20 PM MRN: [id number] Date of Birth: [birth year] Attending MD: Elon Alas. Abbey Chatters DO CSN: [id number] Age: 76 Admit Type: Outpatient Procedure:                Upper GI endoscopy Indications:              Iron deficiency anemia Providers:                Elon Alas. Abbey Chatters, DO, Tammy Vaught, RN, Lambert Mody, Hughie Closs RN, RN Referring MD:              Medicines:                See the Anesthesia note for documentation of the                            administered medications Complications:            No immediate complications. Estimated Blood Loss:     Estimated blood loss: none. Procedure:                Pre-Anesthesia Assessment:                           - The anesthesia plan was to use monitored                            anesthesia care (MAC).                           After obtaining informed consent, the endoscope was                            passed under direct vision. Throughout the                            procedure, the patient's blood pressure, pulse, and                            oxygen saturations were monitored continuously. The                            [id number]-[id number] ([id number]) scope was introduced through the                            mouth, and advanced to the second part of duodenum.                            The upper GI endoscopy was accomplished without                            difficulty. The patient tolerated the procedure                            well. Scope In:  1:30:47 PM Scope Out: 1:34:13 PM Total Procedure Duration: 0 hours 3 minutes 26 seconds  Findings:      There is no endoscopic evidence of esophagitis, hiatal hernia,       ulcerations or varices in the entire esophagus.      Patchy mild inflammation characterized by erythema was found in the       gastric body.      The duodenal bulb, first portion of the duodenum and second portion  of       the duodenum were normal. Impression:               - Gastritis.                           - Normal duodenal bulb, first portion of the                            duodenum and second portion of the duodenum.                           - No specimens collected. Moderate Sedation:      Per Anesthesia Care Recommendation:           - Patient has a contact number available for                            emergencies. The signs and symptoms of potential                            delayed complications were discussed with the                            patient. Return to normal activities tomorrow.                            Written discharge instructions were provided to the                            patient.                           - Resume previous diet.                           - Continue present medications. Procedure Code(s):        --- Professional ---                           [phone], Esophagogastroduodenoscopy, flexible,                            transoral; diagnostic, including collection of                            specimen(s) by brushing or washing, when performed                            (separate procedure) Diagnosis Code(s):        ---  Professional ---                           K29.70, Gastritis, unspecified, without bleeding                           D50.9, Iron deficiency anemia, unspecified CPT copyright 2019 American Medical Association. All rights reserved. The codes documented in this report are preliminary and upon coder review may  be revised to meet current compliance requirements. Elon Alas. Abbey Chatters, DO [place] Abbey Chatters, DO 07/04/2021 1:37:51 PM This report has been signed electronically. Number of Addenda: 0

## 2021-07-04 NOTE — Anesthesia Postprocedure Evaluation (Signed)
Anesthesia Post Note  Patient: Darrell Lang  Procedure(s) Performed: COLONOSCOPY WITH PROPOFOL ESOPHAGOGASTRODUODENOSCOPY (EGD) WITH PROPOFOL POLYPECTOMY  Patient location during evaluation: PACU Anesthesia Type: General Level of consciousness: awake and alert and oriented Pain management: pain level controlled Vital Signs Assessment: post-procedure vital signs reviewed and stable Respiratory status: spontaneous breathing, nonlabored ventilation and respiratory function stable Cardiovascular status: blood pressure returned to baseline and stable Postop Assessment: no apparent nausea or vomiting Anesthetic complications: no   No notable events documented.   Last Vitals:  Vitals:   07/04/21 1214 07/04/21 1400  BP: (!) 149/77 110/72  Pulse: 80 78  Resp: 20 (!) 23  Temp: 36.6 C 36.8 C  SpO2: 99% 98%    Last Pain:  Vitals:   07/04/21 1400  TempSrc: Axillary  PainSc: 0-No pain                 Fatuma Dowers C Marnee Sherrard

## 2021-07-04 NOTE — Anesthesia Procedure Notes (Signed)
Date/Time: 07/04/2021 1:26 PM Performed by: Vista Deck, CRNA Pre-anesthesia Checklist: Patient identified, Emergency Drugs available, Suction available, Timeout performed and Patient being monitored Patient Re-evaluated:Patient Re-evaluated prior to induction Oxygen Delivery Method: Nasal Cannula

## 2021-07-04 NOTE — Op Note (Signed)
[facility] Patient Name: Darrell Lang Procedure Date: 07/04/2021 1:19 PM MRN: [id number] Date of Birth: 01-13-1946 Attending MD: Elon Alas. Abbey Chatters DO CSN: [id number] Age: 75 Admit Type: Outpatient Procedure:                Colonoscopy Indications:              Iron deficiency anemia Providers:                Elon Alas. Abbey Chatters, DO, Tammy Vaught, RN, Hughie Closs RN, RN, Lambert Mody Referring MD:              Medicines:                See the Anesthesia note for documentation of the                            administered medications Complications:            No immediate complications. Estimated Blood Loss:     Estimated blood loss was minimal. Procedure:                Pre-Anesthesia Assessment:                           - The anesthesia plan was to use monitored                            anesthesia care (MAC).                           After obtaining informed consent, the colonoscope                            was passed under direct vision. Throughout the                            procedure, the patient's blood pressure, pulse, and                            oxygen saturations were monitored continuously. The                            [id number]-[id number] ([id number]) scope was introduced through                            the anus and advanced to the the cecum, identified                            by appendiceal orifice and ileocecal valve. The                            colonoscopy was performed without difficulty. The                            patient tolerated the procedure well.  The quality                            of the bowel preparation was evaluated using the                            BBPS [facility] Bowel Preparation Scale) with scores                            of: Right Colon = 2 (minor amount of residual                            staining, small fragments of stool and/or opaque                            liquid, but mucosa seen  well), Transverse Colon = 2                            (minor amount of residual staining, small fragments                            of stool and/or opaque liquid, but mucosa seen                            well) and Left Colon = 2 (minor amount of residual                            staining, small fragments of stool and/or opaque                            liquid, but mucosa seen well). The total BBPS score                            equals 6. The quality of the bowel preparation was                            fair. Scope In: 1:38:29 PM Scope Out: 1:57:44 PM Scope Withdrawal Time: 0 hours 13 minutes 28 seconds  Total Procedure Duration: 0 hours 19 minutes 15 seconds  Findings:      The perianal and digital rectal examinations were normal.      Non-bleeding internal hemorrhoids were found during endoscopy.      Multiple small-mouthed diverticula were found in the sigmoid colon.      A 15 mm polyp was found in the ascending colon. The polyp was sessile.       The polyp was removed with a cold snare. Resection and retrieval were       complete. Impression:               - Preparation of the colon was fair.                           - Non-bleeding internal hemorrhoids.                           -  Diverticulosis in the sigmoid colon.                           - One 15 mm polyp in the ascending colon, removed                            with a cold snare. Resected and retrieved. Moderate Sedation:      Per Anesthesia Care Recommendation:           - Patient has a contact number available for                            emergencies. The signs and symptoms of potential                            delayed complications were discussed with the                            patient. Return to normal activities tomorrow.                            Written discharge instructions were provided to the                            patient.                           - Resume previous diet.                            - Continue present medications.                           - Await pathology results.                           - Repeat colonoscopy in 3 years for surveillance.                           - Return to GI clinic in 4 months.                           - Consider capsule endoscopy for completeness. Procedure Code(s):        --- Professional ---                           [phone], Colonoscopy, flexible; with removal of                            tumor(s), polyp(s), or other lesion(s) by snare                            technique Diagnosis Code(s):        --- Professional ---  K64.8, Other hemorrhoids                           K63.5, Polyp of colon                           D50.9, Iron deficiency anemia, unspecified                           K57.30, Diverticulosis of large intestine without                            perforation or abscess without bleeding CPT copyright 2019 American Medical Association. All rights reserved. The codes documented in this report are preliminary and upon coder review may  be revised to meet current compliance requirements. Elon Alas. Abbey Chatters, DO [place] Abbey Chatters, DO 07/04/2021 2:00:47 PM This report has been signed electronically. Number of Addenda: 0

## 2021-07-04 NOTE — Discharge Instructions (Addendum)
EGD Discharge instructions Please read the instructions outlined below and refer to this sheet in the next few weeks. These discharge instructions provide you with general information on caring for yourself after you leave the hospital. Your doctor may also give you specific instructions. While your treatment has been planned according to the most current medical practices available, unavoidable complications occasionally occur. If you have any problems or questions after discharge, please call your doctor. ACTIVITY You may resume your regular activity but move at a slower pace for the next 24 hours.  Take frequent rest periods for the next 24 hours.  Walking will help expel (get rid of) the air and reduce the bloated feeling in your abdomen.  No driving for 24 hours (because of the anesthesia (medicine) used during the test).  You may shower.  Do not sign any important legal documents or operate any machinery for 24 hours (because of the anesthesia used during the test).  NUTRITION Drink plenty of fluids.  You may resume your normal diet.  Begin with a light meal and progress to your normal diet.  Avoid alcoholic beverages for 24 hours or as instructed by your caregiver.  MEDICATIONS You may resume your normal medications unless your caregiver tells you otherwise.  WHAT YOU CAN EXPECT TODAY You may experience abdominal discomfort such as a feeling of fullness or gas pains.  FOLLOW-UP Your doctor will discuss the results of your test with you.  SEEK IMMEDIATE MEDICAL ATTENTION IF ANY OF THE FOLLOWING OCCUR: Excessive nausea (feeling sick to your stomach) and/or vomiting.  Severe abdominal pain and distention (swelling).  Trouble swallowing.  Temperature over 101 F (37.8 C).  Rectal bleeding or vomiting of blood.     Colonoscopy Discharge Instructions  Read the instructions outlined below and refer to this sheet in the next few weeks. These discharge instructions provide you with  general information on caring for yourself after you leave the hospital. Your doctor may also give you specific instructions. While your treatment has been planned according to the most current medical practices available, unavoidable complications occasionally occur.   ACTIVITY You may resume your regular activity, but move at a slower pace for the next 24 hours.  Take frequent rest periods for the next 24 hours.  Walking will help get rid of the air and reduce the bloated feeling in your belly (abdomen).  No driving for 24 hours (because of the medicine (anesthesia) used during the test).   Do not sign any important legal documents or operate any machinery for 24 hours (because of the anesthesia used during the test).  NUTRITION Drink plenty of fluids.  You may resume your normal diet as instructed by your doctor.  Begin with a light meal and progress to your normal diet. Heavy or fried foods are harder to digest and may make you feel sick to your stomach (nauseated).  Avoid alcoholic beverages for 24 hours or as instructed.  MEDICATIONS You may resume your normal medications unless your doctor tells you otherwise.  WHAT YOU CAN EXPECT TODAY Some feelings of bloating in the abdomen.  Passage of more gas than usual.  Spotting of blood in your stool or on the toilet paper.  IF YOU HAD POLYPS REMOVED DURING THE COLONOSCOPY: No aspirin products for 7 days or as instructed.  No alcohol for 7 days or as instructed.  Eat a soft diet for the next 24 hours.  FINDING OUT THE RESULTS OF YOUR TEST Not all test results are  available during your visit. If your test results are not back during the visit, make an appointment with your caregiver to find out the results. Do not assume everything is normal if you have not heard from your caregiver or the medical facility. It is important for you to follow up on all of your test results.  SEEK IMMEDIATE MEDICAL ATTENTION IF: You have more than a spotting of  blood in your stool.  Your belly is swollen (abdominal distention).  You are nauseated or vomiting.  You have a temperature over 101.  You have abdominal pain or discomfort that is severe or gets worse throughout the day.   Your EGD was largely unremarkable besides mild inflammation in your stomach.  Colon revealed 1 large polyp which I removed successfully.  Await pathology results, my office will contact you.  Would recommend repeat colonoscopy in 3 years for surveillance purposes.  No active or stigmata of GI bleeding identified.  Consider small bowel capsule for completeness.  Follow-up with Vicente Males in 4 months.  I hope you have a great rest of your week!  Elon Alas. Abbey Chatters, D.O. Gastroenterology and Hepatology [facility]

## 2021-07-04 NOTE — Anesthesia Preprocedure Evaluation (Signed)
Anesthesia Evaluation  Patient identified by MRN, date of birth, ID band Patient awake    Reviewed: Allergy & Precautions, NPO status , Patient's Chart, lab work & pertinent test results, reviewed documented beta blocker date and time   History of Anesthesia Complications (+) Family history of anesthesia reaction  Airway Mallampati: II   Neck ROM: Full    Dental  (+) Edentulous Upper, Edentulous Lower   Pulmonary shortness of breath and with exertion, former smoker,    Pulmonary exam normal breath sounds clear to auscultation       Cardiovascular hypertension, Pt. on medications and Pt. on home beta blockers Normal cardiovascular exam+ dysrhythmias Atrial Fibrillation  Rhythm:Regular Rate:Normal     Neuro/Psych PSYCHIATRIC DISORDERS Anxiety Depression negative neurological ROS     GI/Hepatic negative GI ROS, Neg liver ROS,   Endo/Other  diabetes, Well Controlled, Type 2, Oral Hypoglycemic AgentsHyperthyroidism   Renal/GU negative Renal ROS  negative genitourinary   Musculoskeletal negative musculoskeletal ROS (+)   Abdominal   Peds negative pediatric ROS (+)  Hematology  (+) Blood dyscrasia, anemia ,   Anesthesia Other Findings   Reproductive/Obstetrics negative OB ROS                            Anesthesia Physical Anesthesia Plan  ASA: 3  Anesthesia Plan: General   Post-op Pain Management: Minimal or no pain anticipated   Induction: Intravenous  PONV Risk Score and Plan: TIVA  Airway Management Planned: Nasal Cannula and Natural Airway  Additional Equipment:   Intra-op Plan:   Post-operative Plan:   Informed Consent: I have reviewed the patients History and Physical, chart, labs and discussed the procedure including the risks, benefits and alternatives for the proposed anesthesia with the patient or authorized representative who has indicated his/her understanding and  acceptance.       Plan Discussed with: CRNA and Surgeon  Anesthesia Plan Comments:        Anesthesia Quick Evaluation

## 2021-07-06 LAB — SURGICAL PATHOLOGY

## 2021-07-07 ENCOUNTER — Encounter (HOSPITAL_COMMUNITY): Payer: Self-pay | Admitting: Internal Medicine

## 2021-08-19 ENCOUNTER — Emergency Department (HOSPITAL_COMMUNITY): Payer: No Typology Code available for payment source

## 2021-08-19 ENCOUNTER — Ambulatory Visit
Admission: EM | Admit: 2021-08-19 | Discharge: 2021-08-19 | Disposition: A | Discharge status: Home / Self Care | Payer: Medicare Other | Attending: Family Medicine | Admitting: Family Medicine

## 2021-08-19 ENCOUNTER — Other Ambulatory Visit: Payer: Self-pay

## 2021-08-19 ENCOUNTER — Inpatient Hospital Stay (HOSPITAL_COMMUNITY)
Admission: EM | Admit: 2021-08-19 | Discharge: 2021-08-23 | DRG: 872 | Disposition: A | Discharge status: Home Health | Payer: No Typology Code available for payment source | Attending: Family Medicine | Admitting: Family Medicine

## 2021-08-19 ENCOUNTER — Encounter: Payer: Self-pay | Admitting: Emergency Medicine

## 2021-08-19 ENCOUNTER — Encounter (HOSPITAL_COMMUNITY): Payer: Self-pay | Admitting: *Deleted

## 2021-08-19 DIAGNOSIS — Z7984 Long term (current) use of oral hypoglycemic drugs: Secondary | ICD-10-CM | POA: Diagnosis not present

## 2021-08-19 DIAGNOSIS — N10 Acute tubulo-interstitial nephritis: Secondary | ICD-10-CM | POA: Diagnosis present

## 2021-08-19 DIAGNOSIS — A4159 Other Gram-negative sepsis: Principal | ICD-10-CM | POA: Diagnosis present

## 2021-08-19 DIAGNOSIS — Z8616 Personal history of COVID-19: Secondary | ICD-10-CM | POA: Diagnosis not present

## 2021-08-19 DIAGNOSIS — F32A Depression, unspecified: Secondary | ICD-10-CM | POA: Diagnosis present

## 2021-08-19 DIAGNOSIS — I4891 Unspecified atrial fibrillation: Secondary | ICD-10-CM

## 2021-08-19 DIAGNOSIS — E785 Hyperlipidemia, unspecified: Secondary | ICD-10-CM | POA: Diagnosis present

## 2021-08-19 DIAGNOSIS — F419 Anxiety disorder, unspecified: Secondary | ICD-10-CM | POA: Diagnosis present

## 2021-08-19 DIAGNOSIS — E0590 Thyrotoxicosis, unspecified without thyrotoxic crisis or storm: Secondary | ICD-10-CM | POA: Diagnosis present

## 2021-08-19 DIAGNOSIS — E119 Type 2 diabetes mellitus without complications: Secondary | ICD-10-CM | POA: Diagnosis present

## 2021-08-19 DIAGNOSIS — J209 Acute bronchitis, unspecified: Secondary | ICD-10-CM | POA: Diagnosis present

## 2021-08-19 DIAGNOSIS — J22 Unspecified acute lower respiratory infection: Secondary | ICD-10-CM | POA: Diagnosis present

## 2021-08-19 DIAGNOSIS — E781 Pure hyperglyceridemia: Secondary | ICD-10-CM | POA: Diagnosis present

## 2021-08-19 DIAGNOSIS — N390 Urinary tract infection, site not specified: Secondary | ICD-10-CM | POA: Diagnosis not present

## 2021-08-19 DIAGNOSIS — Z79899 Other long term (current) drug therapy: Secondary | ICD-10-CM | POA: Diagnosis not present

## 2021-08-19 DIAGNOSIS — Z7901 Long term (current) use of anticoagulants: Secondary | ICD-10-CM | POA: Diagnosis not present

## 2021-08-19 DIAGNOSIS — N3000 Acute cystitis without hematuria: Principal | ICD-10-CM

## 2021-08-19 DIAGNOSIS — A419 Sepsis, unspecified organism: Secondary | ICD-10-CM | POA: Diagnosis not present

## 2021-08-19 DIAGNOSIS — I482 Chronic atrial fibrillation, unspecified: Secondary | ICD-10-CM

## 2021-08-19 DIAGNOSIS — R6520 Severe sepsis without septic shock: Secondary | ICD-10-CM | POA: Diagnosis not present

## 2021-08-19 DIAGNOSIS — J069 Acute upper respiratory infection, unspecified: Secondary | ICD-10-CM | POA: Diagnosis not present

## 2021-08-19 DIAGNOSIS — D509 Iron deficiency anemia, unspecified: Secondary | ICD-10-CM | POA: Diagnosis present

## 2021-08-19 DIAGNOSIS — Z823 Family history of stroke: Secondary | ICD-10-CM

## 2021-08-19 DIAGNOSIS — Z9049 Acquired absence of other specified parts of digestive tract: Secondary | ICD-10-CM

## 2021-08-19 DIAGNOSIS — E1165 Type 2 diabetes mellitus with hyperglycemia: Secondary | ICD-10-CM

## 2021-08-19 DIAGNOSIS — N179 Acute kidney failure, unspecified: Secondary | ICD-10-CM | POA: Diagnosis not present

## 2021-08-19 DIAGNOSIS — I10 Essential (primary) hypertension: Secondary | ICD-10-CM | POA: Diagnosis present

## 2021-08-19 DIAGNOSIS — Z87891 Personal history of nicotine dependence: Secondary | ICD-10-CM

## 2021-08-19 LAB — URINALYSIS, MICROSCOPIC (REFLEX): Squamous Epithelial / HPF: NONE SEEN (ref 0–5)

## 2021-08-19 LAB — CBC WITH DIFFERENTIAL/PLATELET
Abs Immature Granulocytes: 0.13 10*3/uL — ABNORMAL HIGH (ref 0.00–0.07)
Basophils Absolute: 0.1 10*3/uL (ref 0.0–0.1)
Basophils Relative: 0 %
Eosinophils Absolute: 0 10*3/uL (ref 0.0–0.5)
Eosinophils Relative: 0 %
HCT: 34.7 % — ABNORMAL LOW (ref 39.0–52.0)
Hemoglobin: 10.9 g/dL — ABNORMAL LOW (ref 13.0–17.0)
Immature Granulocytes: 1 %
Lymphocytes Relative: 4 %
Lymphs Abs: 0.7 10*3/uL (ref 0.7–4.0)
MCH: 25.6 pg — ABNORMAL LOW (ref 26.0–34.0)
MCHC: 31.4 g/dL (ref 30.0–36.0)
MCV: 81.5 fL (ref 80.0–100.0)
Monocytes Absolute: 1.7 10*3/uL — ABNORMAL HIGH (ref 0.1–1.0)
Monocytes Relative: 8 %
Neutro Abs: 18.3 10*3/uL — ABNORMAL HIGH (ref 1.7–7.7)
Neutrophils Relative %: 87 %
Platelets: 353 10*3/uL (ref 150–400)
RBC: 4.26 MIL/uL (ref 4.22–5.81)
RDW: 16.8 % — ABNORMAL HIGH (ref 11.5–15.5)
WBC: 20.9 10*3/uL — ABNORMAL HIGH (ref 4.0–10.5)
nRBC: 0 % (ref 0.0–0.2)

## 2021-08-19 LAB — COMPREHENSIVE METABOLIC PANEL
ALT: 24 U/L (ref 0–44)
AST: 34 U/L (ref 15–41)
Albumin: 3.6 g/dL (ref 3.5–5.0)
Alkaline Phosphatase: 117 U/L (ref 38–126)
Anion gap: 12 (ref 5–15)
BUN: 19 mg/dL (ref 8–23)
CO2: 22 mmol/L (ref 22–32)
Calcium: 9 mg/dL (ref 8.9–10.3)
Chloride: 100 mmol/L (ref 98–111)
Creatinine, Ser: 1.28 mg/dL — ABNORMAL HIGH (ref 0.61–1.24)
GFR, Estimated: 59 mL/min — ABNORMAL LOW (ref >60)
Glucose, Bld: 314 mg/dL — ABNORMAL HIGH (ref 70–99)
Potassium: 3.8 mmol/L (ref 3.5–5.1)
Sodium: 134 mmol/L — ABNORMAL LOW (ref 135–145)
Total Bilirubin: 0.6 mg/dL (ref 0.3–1.2)
Total Protein: 8 g/dL (ref 6.5–8.1)

## 2021-08-19 LAB — RESP PANEL BY RT-PCR (FLU A&B, COVID) ARPGX2
Influenza A by PCR: NEGATIVE
Influenza B by PCR: NEGATIVE
SARS Coronavirus 2 by RT PCR: NEGATIVE

## 2021-08-19 LAB — URINALYSIS, ROUTINE W REFLEX MICROSCOPIC
Bilirubin Urine: NEGATIVE
Glucose, UA: NEGATIVE mg/dL
Ketones, ur: NEGATIVE mg/dL
Nitrite: NEGATIVE
Protein, ur: 100 mg/dL — AB
Specific Gravity, Urine: 1.02 (ref 1.005–1.030)
pH: 6 (ref 5.0–8.0)

## 2021-08-19 LAB — LACTIC ACID, PLASMA
Lactic Acid, Venous: 2.3 mmol/L (ref 0.5–1.9)
Lactic Acid, Venous: 1.6 mmol/L (ref 0.5–1.9)

## 2021-08-19 LAB — APTT: aPTT: 46 seconds — ABNORMAL HIGH (ref 24–36)

## 2021-08-19 LAB — PROTIME-INR
INR: 1.6 — ABNORMAL HIGH (ref 0.8–1.2)
Prothrombin Time: 19.1 seconds — ABNORMAL HIGH (ref 11.4–15.2)

## 2021-08-19 MED ORDER — INSULIN ASPART 100 UNIT/ML IJ SOLN
0.0000 [IU] | Freq: Three times a day (TID) | INTRAMUSCULAR | Status: DC
  Administered 2021-08-20: 2 [IU] via SUBCUTANEOUS
  Administered 2021-08-20 (×2): 3 [IU] via SUBCUTANEOUS
  Administered 2021-08-21 (×3): 5 [IU] via SUBCUTANEOUS
  Administered 2021-08-22: 2 [IU] via SUBCUTANEOUS
  Administered 2021-08-22: 5 [IU] via SUBCUTANEOUS
  Administered 2021-08-22 – 2021-08-23 (×2): 3 [IU] via SUBCUTANEOUS
  Administered 2021-08-23: 5 [IU] via SUBCUTANEOUS

## 2021-08-19 MED ORDER — SODIUM CHLORIDE 0.9 % IV SOLN
500.0000 mg | INTRAVENOUS | Status: DC
  Administered 2021-08-19 – 2021-08-22 (×4): 500 mg via INTRAVENOUS
  Filled 2021-08-19 (×5): qty 5

## 2021-08-19 MED ORDER — INSULIN GLARGINE-YFGN 100 UNIT/ML ~~LOC~~ SOLN
15.0000 [IU] | Freq: Every day | SUBCUTANEOUS | Status: DC
  Administered 2021-08-21 – 2021-08-22 (×3): 15 [IU] via SUBCUTANEOUS
  Filled 2021-08-19 (×5): qty 0.15

## 2021-08-19 MED ORDER — ATORVASTATIN CALCIUM 10 MG PO TABS
10.0000 mg | ORAL_TABLET | Freq: Every day | ORAL | Status: DC
  Administered 2021-08-20 – 2021-08-23 (×4): 10 mg via ORAL
  Filled 2021-08-19 (×4): qty 1

## 2021-08-19 MED ORDER — QUETIAPINE FUMARATE 25 MG PO TABS
50.0000 mg | ORAL_TABLET | Freq: Every day | ORAL | Status: DC
  Administered 2021-08-19 – 2021-08-22 (×4): 50 mg via ORAL
  Filled 2021-08-19 (×4): qty 2

## 2021-08-19 MED ORDER — FINASTERIDE 5 MG PO TABS
5.0000 mg | ORAL_TABLET | Freq: Every day | ORAL | Status: DC
  Administered 2021-08-20 – 2021-08-22 (×3): 5 mg via ORAL
  Filled 2021-08-19 (×3): qty 1

## 2021-08-19 MED ORDER — BUSPIRONE HCL 5 MG PO TABS
2.5000 mg | ORAL_TABLET | Freq: Every day | ORAL | Status: DC
  Administered 2021-08-20 – 2021-08-23 (×4): 2.5 mg via ORAL
  Filled 2021-08-19 (×4): qty 1

## 2021-08-19 MED ORDER — APIXABAN 5 MG PO TABS
5.0000 mg | ORAL_TABLET | Freq: Two times a day (BID) | ORAL | Status: DC
  Administered 2021-08-20 – 2021-08-23 (×7): 5 mg via ORAL
  Filled 2021-08-19 (×7): qty 1

## 2021-08-19 MED ORDER — SODIUM CHLORIDE 0.9 % IV BOLUS
1000.0000 mL | Freq: Once | INTRAVENOUS | Status: AC
Start: 2021-08-19 — End: 2021-08-19
  Administered 2021-08-19: 1000 mL via INTRAVENOUS

## 2021-08-19 MED ORDER — METHIMAZOLE 5 MG PO TABS
5.0000 mg | ORAL_TABLET | Freq: Every day | ORAL | Status: DC
  Administered 2021-08-20 – 2021-08-23 (×4): 5 mg via ORAL
  Filled 2021-08-19 (×7): qty 1

## 2021-08-19 MED ORDER — PANTOPRAZOLE SODIUM 40 MG PO TBEC
40.0000 mg | DELAYED_RELEASE_TABLET | Freq: Every day | ORAL | Status: DC
  Administered 2021-08-20 – 2021-08-23 (×4): 40 mg via ORAL
  Filled 2021-08-19 (×4): qty 1

## 2021-08-19 MED ORDER — INSULIN ASPART 100 UNIT/ML IJ SOLN
0.0000 [IU] | Freq: Every day | INTRAMUSCULAR | Status: DC
  Administered 2021-08-21 – 2021-08-22 (×2): 2 [IU] via SUBCUTANEOUS

## 2021-08-19 MED ORDER — ACETAMINOPHEN 325 MG PO TABS
650.0000 mg | ORAL_TABLET | Freq: Once | ORAL | Status: AC
  Administered 2021-08-19: 650 mg via ORAL
  Filled 2021-08-19: qty 2

## 2021-08-19 MED ORDER — GABAPENTIN 300 MG PO CAPS
300.0000 mg | ORAL_CAPSULE | Freq: Two times a day (BID) | ORAL | Status: DC
  Administered 2021-08-20 – 2021-08-23 (×7): 300 mg via ORAL
  Filled 2021-08-19 (×7): qty 1

## 2021-08-19 MED ORDER — ACETAMINOPHEN 325 MG PO TABS
650.0000 mg | ORAL_TABLET | Freq: Four times a day (QID) | ORAL | Status: DC | PRN
  Administered 2021-08-20 (×2): 650 mg via ORAL
  Filled 2021-08-19 (×3): qty 2

## 2021-08-19 MED ORDER — METOPROLOL TARTRATE 25 MG PO TABS
25.0000 mg | ORAL_TABLET | Freq: Two times a day (BID) | ORAL | Status: DC
  Administered 2021-08-20: 25 mg via ORAL
  Filled 2021-08-19: qty 1

## 2021-08-19 MED ORDER — ACETAMINOPHEN 650 MG RE SUPP
650.0000 mg | Freq: Four times a day (QID) | RECTAL | Status: DC | PRN
Start: 2021-08-19 — End: 2021-08-23

## 2021-08-19 MED ORDER — BENZTROPINE MESYLATE 1 MG PO TABS
1.0000 mg | ORAL_TABLET | Freq: Every day | ORAL | Status: DC
  Administered 2021-08-19 – 2021-08-22 (×4): 1 mg via ORAL
  Filled 2021-08-19 (×4): qty 1

## 2021-08-19 MED ORDER — SODIUM CHLORIDE 0.9 % IV BOLUS
500.0000 mL | Freq: Once | INTRAVENOUS | Status: AC
  Administered 2021-08-19: 500 mL via INTRAVENOUS

## 2021-08-19 MED ORDER — SODIUM CHLORIDE 0.9 % IV SOLN
2.0000 g | INTRAVENOUS | Status: DC
  Administered 2021-08-19 – 2021-08-21 (×3): 2 g via INTRAVENOUS
  Filled 2021-08-19 (×2): qty 20

## 2021-08-19 MED ORDER — ONDANSETRON HCL 4 MG/2ML IJ SOLN: 4.0000 mg | Freq: Four times a day (QID) | INTRAMUSCULAR | Status: DC | PRN

## 2021-08-19 MED ORDER — TAMSULOSIN HCL 0.4 MG PO CAPS
0.8000 mg | ORAL_CAPSULE | Freq: Every day | ORAL | Status: DC
  Administered 2021-08-19 – 2021-08-22 (×4): 0.8 mg via ORAL
  Filled 2021-08-19 (×4): qty 2

## 2021-08-19 MED ORDER — SODIUM CHLORIDE 0.9 % IV SOLN: INTRAVENOUS | Status: DC

## 2021-08-19 MED ORDER — RISPERIDONE 1 MG PO TABS
1.0000 mg | ORAL_TABLET | Freq: Every day | ORAL | Status: DC
  Administered 2021-08-19 – 2021-08-22 (×4): 1 mg via ORAL
  Filled 2021-08-19 (×4): qty 1

## 2021-08-19 MED ORDER — AMLODIPINE BESYLATE 5 MG PO TABS
5.0000 mg | ORAL_TABLET | Freq: Every day | ORAL | Status: DC
  Administered 2021-08-19 – 2021-08-22 (×4): 5 mg via ORAL
  Filled 2021-08-19 (×4): qty 1

## 2021-08-19 MED ORDER — LOSARTAN POTASSIUM 50 MG PO TABS
100.0000 mg | ORAL_TABLET | Freq: Every day | ORAL | Status: DC
  Administered 2021-08-20 – 2021-08-23 (×4): 100 mg via ORAL
  Filled 2021-08-19 (×4): qty 2

## 2021-08-19 MED ORDER — CITALOPRAM HYDROBROMIDE 20 MG PO TABS
20.0000 mg | ORAL_TABLET | Freq: Every day | ORAL | Status: DC
  Administered 2021-08-20 – 2021-08-22 (×3): 20 mg via ORAL
  Filled 2021-08-19 (×3): qty 1

## 2021-08-19 MED ORDER — ONDANSETRON HCL 4 MG PO TABS: 4.0000 mg | ORAL_TABLET | Freq: Four times a day (QID) | ORAL | Status: DC | PRN

## 2021-08-19 NOTE — ED Triage Notes (Signed)
Referral from urgent care with a fib ?

## 2021-08-19 NOTE — Progress Notes (Signed)
Code Sepsis followed by Elink. ?

## 2021-08-19 NOTE — ED Notes (Signed)
Patient is being discharged from the Urgent Care and sent to the Emergency Department via private vehicle . Per PA, patient is in need of higher level of care due to A-fib with RVR. Patient is aware and verbalizes understanding of plan of care.  ?Vitals:  ? 08/19/21 1659 08/19/21 1731  ?BP: (!) 158/64 (!) 146/72  ?Pulse: (!) 125 99  ?Resp: 18   ?Temp: 98.2 ?F (36.8 ?C)   ?SpO2: 95% 93%  ? ? ?

## 2021-08-19 NOTE — H&P (Signed)
?History and Physical  ? ? ?Patient: Darrell Lang ATF:[id number] DOB: Dec 11, 1945 ?DOA: 08/19/2021 ?DOS: the patient was seen and examined on 08/19/2021 ?PCP: [facility]  ?Patient coming from: Home ? ?Chief Complaint:  ?Chief Complaint  ?Patient presents with  ? Shortness of Breath  ? Atrial Fibrillation  ? ?HPI: Darrell Lang is a 76 y.o. male with medical history significant of atrial fibrillation, hypertension, diabetes, anxiety depression on multiple antipsychotics.  Patient seen for productive cough with green sputum, fevers, chills that started yesterday.  Patient denies shortness of breath or chest pain.  He did go to urgent care today to be evaluated, however he was sent to the emergency department due to his heart rate being in rapid ventricular response.  Here, the patient received 2 L of fluid bolus and his heart rate decreased into normal range.  He had a chest x-ray which did not show an acute infection.  He received Rocephin and azithromycin.  His UA does show evidence of infection and his white count is 20.  Lactic acid was slightly elevated initially, but improved on repeat.  He denies dysuria.  He does have some mild frequency. ? ?Review of Systems: As mentioned in the history of present illness. All other systems reviewed and are negative. ?Past Medical History:  ?Diagnosis Date  ? Anxiety and depression   ? Atrial fibrillation with RVR ([place]) 02/08/2019  ? COVID-19 virus infection 02/09/2019  ? Diabetes ([place])   ? Encounter for screening colonoscopy 07/28/2013  ? Essential hypertension 02/09/2019  ? Family history of adverse reaction to anesthesia   ? Hyperlipidemia 02/09/2019  ? Hypertension   ? Hypertriglyceridemia   ? ?Past Surgical History:  ?Procedure Laterality Date  ? APPENDECTOMY    ? 6th grade  ? COLONOSCOPY  01/14/2004  ? Dr. Gala Romney: internal hemorrhoids, otherwise normal rectum. Normal colon and normal TI  ? COLONOSCOPY N/A 08/06/2013  ? normal, internal hemorrhoids  ? COLONOSCOPY  WITH PROPOFOL N/A 07/04/2021  ? Procedure: COLONOSCOPY WITH PROPOFOL;  Surgeon: [name], DO;  Location: [facility];  Service: Endoscopy;  Laterality: N/A;  2:15pm  ? ESOPHAGOGASTRODUODENOSCOPY (EGD) WITH PROPOFOL N/A 07/04/2021  ? Procedure: ESOPHAGOGASTRODUODENOSCOPY (EGD) WITH PROPOFOL;  Surgeon: [name], DO;  Location: [facility];  Service: Endoscopy;  Laterality: N/A;  ? POLYPECTOMY  07/04/2021  ? Procedure: POLYPECTOMY;  Surgeon: [name], DO;  Location: [facility];  Service: Endoscopy;;  ? ?Social History:  reports that he has quit smoking. His smoking use included cigarettes. He has never used smokeless tobacco. He reports that he does not drink alcohol and does not use drugs. ? ?Allergies  ?Allergen Reactions  ? Empagliflozin   ?  Other reaction(s): Urinary tract infectious disease  ? Haldol [Haloperidol]   ?  "Messed up his thoughts" pt takes risperidone   ? Lisinopril Cough  ? Sulfamethoxazole-Trimethoprim   ?  Unknown reaction  ? ? ?Family History  ?Problem Relation Age of Onset  ? Stroke Father   ? Colon cancer Neg Hx   ? Thyroid disease Neg Hx   ? Colon polyps Neg Hx   ? ? ?Prior to Admission medications   ?Medication Sig Start Date End Date Taking? Authorizing Provider  ?amLODipine (NORVASC) 5 MG tablet Take 1 tablet (5 mg total) by mouth daily. 08/12/19   Richardson Dopp T, PA-C  ?apixaban (ELIQUIS) 5 MG TABS tablet Take 1 tablet (5 mg total) by mouth 2 (two) times daily. 02/24/19  Nahser, Wonda Cheng, MD  ?atorvastatin (LIPITOR) 10 MG tablet Take 10 mg by mouth daily. 06/21/20   [provider]  ?benztropine (COGENTIN) 1 MG tablet Take 1 mg by mouth at bedtime.    [provider]  ?busPIRone (BUSPAR) 5 MG tablet Take 2.5 mg by mouth daily. 09/21/20   [provider]  ?carboxymethylcellulose (REFRESH PLUS) 0.5 % SOLN Place 1 drop into both eyes 2 (two) times daily as needed (dry eye). 04/11/21   [provider]  ?Carboxymethylcellulose  Sodium 1 % GEL Place 1 drop into both eyes as needed (eye glued together). 12/09/20   [provider]  ?Cholecalciferol 50 MCG (2000 UT) TABS Take 2,000 Units by mouth 3 (three) times a week. 06/21/20   [provider]  ?citalopram (CELEXA) 20 MG tablet Take 20 mg by mouth daily.    [provider]  ?Eyelid Cleansers (AVENOVA) 0.01 % SOLN Place 1 spray into both eyes 2 (two) times daily as needed (irritation). 12/12/20   [provider]  ?finasteride (PROSCAR) 5 MG tablet Take 5 mg by mouth daily. 06/21/20   [provider]  ?fluticasone (FLONASE) 50 MCG/ACT nasal spray Place 2 sprays into both nostrils daily. ?Patient not taking: Reported on 05/14/2021 02/15/21   Melynda Ripple, MD  ?gabapentin (NEURONTIN) 300 MG capsule Take 300 mg by mouth 2 (two) times daily. 05/11/20   [provider]  ?glipiZIDE (GLUCOTROL) 5 MG tablet Take 2.5-5 mg by mouth See admin instructions. Take 5 mg in the morning and 2.5 mg every evening    [provider]  ?glucose-Vitamin C 4-0.006 GM CHEW chewable tablet Chew 1 tablet by mouth as needed for low blood sugar. 09/30/20   [provider]  ?losartan (COZAAR) 100 MG tablet Take 100 mg by mouth daily.    [provider]  ?Magnesium Oxide 420 MG TABS Take 420 mg by mouth daily. 12/19/20   [provider]  ?metFORMIN (GLUCOPHAGE) 1000 MG tablet Take 1,000-1,500 mg by mouth See admin instructions. Take 1000 mg by mouth in the morning and take 1500 mg in the evening    [provider]  ?methimazole (TAPAZOLE) 5 MG tablet Take 1 tablet (5 mg total) by mouth daily. Hyperthyroidism: E05.90 07/01/19   Renato Shin, MD  ?metoprolol tartrate (LOPRESSOR) 25 MG tablet Take 1 tablet (25 mg total) by mouth 2 (two) times daily. 03/06/19   Nahser, Wonda Cheng, MD  ?Multiple Vitamin (MULTIVITAMIN WITH MINERALS) TABS tablet Take 1 tablet by mouth daily.    [provider]  ?omeprazole (PRILOSEC) 20 MG capsule  Take 20 mg by mouth daily.    [provider]  ?QUEtiapine (SEROQUEL) 50 MG tablet Take 50 mg by mouth at bedtime.    [provider]  ?risperiDONE (RISPERDAL) 1 MG tablet Take 1 mg by mouth at bedtime.    [provider]  ?Semaglutide,0.25 or 0.'5MG'$ /DOS, 2 MG/1.5ML SOPN Inject 0.5 mg into the skin every Monday. 05/03/21   [provider]  ?tamsulosin (FLOMAX) 0.4 MG CAPS capsule Take 0.8 mg by mouth at bedtime. 10/28/19   [provider]  ?vitamin B-12 (CYANOCOBALAMIN) 500 MCG tablet Take 500 mcg by mouth daily. 06/21/20   [provider]  ? ? ?Physical Exam: ?Vitals:  ? 08/19/21 1905 08/19/21 1930 08/19/21 2100 08/19/21 2130  ?BP:  (!) 84/64 124/63 (!) 120/57  ?Pulse:  (!) 110 89 98  ?Resp:  (!) '22 16 20  '$ ?Temp: (!) 100.8 ?F (38.2 ?C)     ?  TempSrc: Oral     ?SpO2:  97% 96% 95%  ?Weight:      ?Height:      ? ?General: Elderly male. Awake and alert and oriented x3. No acute cardiopulmonary distress.  ?HEENT: Normocephalic atraumatic.  Right and left ears normal in appearance.  Pupils equal, round, reactive to light. Extraocular muscles are intact. Sclerae anicteric and noninjected.  Moist mucosal membranes. No mucosal lesions.  ?Neck: Neck supple without lymphadenopathy. No carotid bruits. No masses palpated.  ?Cardiovascular: Irregularly irregular rate with normal S1-S2 sounds. No murmurs, rubs, gallops auscultated. No JVD.  ?Respiratory: Slightly distant breath sounds.  Rales in bases.  Slight wheeze.  No accessory muscle use. ?Abdomen: Soft, nontender, nondistended. Active bowel sounds. No masses or hepatosplenomegaly  ?Skin: No rashes, lesions, or ulcerations.  Dry, warm to touch. 2+ dorsalis pedis and radial pulses. ?Musculoskeletal: No calf or leg pain. All major joints not erythematous nontender.  No upper or lower joint deformation.  Good ROM.  No contractures  ?Psychiatric: Intact judgment and insight. Pleasant and cooperative. ?Neurologic: No focal  neurological deficits. Strength is 5/5 and symmetric in upper and lower extremities.  Cranial nerves II through XII are grossly intact. ? ?Data Reviewed: ?Results for orders placed or performed during the hosp

## 2021-08-19 NOTE — ED Provider Notes (Signed)
?[facility] ?Provider Note ? ? ?CSN: [id number] ?Arrival date & time: 08/19/21  1746 ? ?  ? ?History ? ?Chief Complaint  ?Patient presents with  ? Shortness of Breath  ? Atrial Fibrillation  ? ? ?Darrell Lang is a 76 y.o. male. ? ?Patient complains of cough and fever and chills.  Patient has a history of diabetes and hypertension ? ?The history is provided by the patient and medical records.  ?Shortness of Breath ?Severity:  Moderate ?Onset quality:  Sudden ?Timing:  Constant ?Progression:  Waxing and waning ?Chronicity:  New ?Context: not activity   ?Relieved by:  Nothing ?Worsened by:  Nothing ?Ineffective treatments:  None tried ?Associated symptoms: fever   ?Associated symptoms: no abdominal pain, no chest pain, no cough, no headaches and no rash   ?Atrial Fibrillation ?Associated symptoms include shortness of breath. Pertinent negatives include no chest pain, no abdominal pain and no headaches.  ? ?  ? ?Home Medications ?Prior to Admission medications   ?Medication Sig Start Date End Date Taking? Authorizing Provider  ?amLODipine (NORVASC) 5 MG tablet Take 1 tablet (5 mg total) by mouth daily. 08/12/19   Richardson Dopp T, PA-C  ?apixaban (ELIQUIS) 5 MG TABS tablet Take 1 tablet (5 mg total) by mouth 2 (two) times daily. 02/24/19   Nahser, Wonda Cheng, MD  ?atorvastatin (LIPITOR) 10 MG tablet Take 10 mg by mouth daily. 06/21/20   [provider]  ?benztropine (COGENTIN) 1 MG tablet Take 1 mg by mouth at bedtime.    [provider]  ?busPIRone (BUSPAR) 5 MG tablet Take 2.5 mg by mouth daily. 09/21/20   [provider]  ?carboxymethylcellulose (REFRESH PLUS) 0.5 % SOLN Place 1 drop into both eyes 2 (two) times daily as needed (dry eye). 04/11/21   [provider]  ?Carboxymethylcellulose Sodium 1 % GEL Place 1 drop into both eyes as needed (eye glued together). 12/09/20   [provider]  ?Cholecalciferol 50 MCG (2000 UT) TABS Take 2,000 Units by mouth 3  (three) times a week. 06/21/20   [provider]  ?citalopram (CELEXA) 20 MG tablet Take 20 mg by mouth daily.    [provider]  ?Eyelid Cleansers (AVENOVA) 0.01 % SOLN Place 1 spray into both eyes 2 (two) times daily as needed (irritation). 12/12/20   [provider]  ?finasteride (PROSCAR) 5 MG tablet Take 5 mg by mouth daily. 06/21/20   [provider]  ?fluticasone (FLONASE) 50 MCG/ACT nasal spray Place 2 sprays into both nostrils daily. ?Patient not taking: Reported on 05/14/2021 02/15/21   Melynda Ripple, MD  ?gabapentin (NEURONTIN) 300 MG capsule Take 300 mg by mouth 2 (two) times daily. 05/11/20   [provider]  ?glipiZIDE (GLUCOTROL) 5 MG tablet Take 2.5-5 mg by mouth See admin instructions. Take 5 mg in the morning and 2.5 mg every evening    [provider]  ?glucose-Vitamin C 4-0.006 GM CHEW chewable tablet Chew 1 tablet by mouth as needed for low blood sugar. 09/30/20   [provider]  ?losartan (COZAAR) 100 MG tablet Take 100 mg by mouth daily.    [provider]  ?Magnesium Oxide 420 MG TABS Take 420 mg by mouth daily. 12/19/20   [provider]  ?metFORMIN (GLUCOPHAGE) 1000 MG tablet Take 1,000-1,500 mg by mouth See admin instructions. Take 1000 mg by mouth in the morning and take 1500 mg in the evening    [provider]  ?methimazole (TAPAZOLE) 5 MG  tablet Take 1 tablet (5 mg total) by mouth daily. Hyperthyroidism: E05.90 07/01/19   Renato Shin, MD  ?metoprolol tartrate (LOPRESSOR) 25 MG tablet Take 1 tablet (25 mg total) by mouth 2 (two) times daily. 03/06/19   Nahser, Wonda Cheng, MD  ?Multiple Vitamin (MULTIVITAMIN WITH MINERALS) TABS tablet Take 1 tablet by mouth daily.    [provider]  ?omeprazole (PRILOSEC) 20 MG capsule Take 20 mg by mouth daily.    [provider]  ?QUEtiapine (SEROQUEL) 50 MG tablet Take 50 mg by mouth at bedtime.    [provider]  ?risperiDONE (RISPERDAL)  1 MG tablet Take 1 mg by mouth at bedtime.    [provider]  ?Semaglutide,0.25 or 0.'5MG'$ /DOS, 2 MG/1.5ML SOPN Inject 0.5 mg into the skin every Monday. 05/03/21   [provider]  ?tamsulosin (FLOMAX) 0.4 MG CAPS capsule Take 0.8 mg by mouth at bedtime. 10/28/19   [provider]  ?vitamin B-12 (CYANOCOBALAMIN) 500 MCG tablet Take 500 mcg by mouth daily. 06/21/20   [provider]  ?   ? ?Allergies    ?Empagliflozin, Haldol [haloperidol], Lisinopril, and Sulfamethoxazole-trimethoprim   ? ?Review of Systems   ?Review of Systems  ?Constitutional:  Positive for fatigue and fever. Negative for appetite change.  ?HENT:  Negative for congestion, ear discharge and sinus pressure.   ?Eyes:  Negative for discharge.  ?Respiratory:  Positive for shortness of breath. Negative for cough.   ?Cardiovascular:  Negative for chest pain.  ?Gastrointestinal:  Negative for abdominal pain and diarrhea.  ?Genitourinary:  Negative for frequency and hematuria.  ?Musculoskeletal:  Negative for back pain.  ?Skin:  Negative for rash.  ?Neurological:  Negative for seizures and headaches.  ?Psychiatric/Behavioral:  Negative for hallucinations.   ? ?Physical Exam ?Updated Vital Signs ?BP 124/63   Pulse 89   Temp (!) 100.8 ?F (38.2 ?C) (Oral)   Resp 16   Ht '5\' 11"'$  (1.803 m)   Wt 86.2 kg   SpO2 96%   BMI 26.50 kg/m?  ?Physical Exam ?Vitals and nursing note reviewed.  ?Constitutional:   ?   Appearance: He is well-developed.  ?HENT:  ?   Head: Normocephalic.  ?   Nose: Nose normal.  ?Eyes:  ?   General: No scleral icterus. ?   Conjunctiva/sclera: Conjunctivae normal.  ?Neck:  ?   Thyroid: No thyromegaly.  ?Cardiovascular:  ?   Rate and Rhythm: Normal rate and regular rhythm.  ?   Heart sounds: No murmur heard. ?  No friction rub. No gallop.  ?Pulmonary:  ?   Breath sounds: No stridor. No wheezing or rales.  ?Chest:  ?   Chest wall: No tenderness.  ?Abdominal:  ?   General: There is no distension.  ?    Tenderness: There is no abdominal tenderness. There is no rebound.  ?Musculoskeletal:     ?   General: Normal range of motion.  ?   Cervical back: Neck supple.  ?Lymphadenopathy:  ?   Cervical: No cervical adenopathy.  ?Skin: ?   Findings: No erythema or rash.  ?Neurological:  ?   Mental Status: He is alert and oriented to person, place, and time.  ?   Motor: No abnormal muscle tone.  ?   Coordination: Coordination normal.  ?Psychiatric:     ?   Behavior: Behavior normal.  ? ? ?ED Results / Procedures / Treatments   ?Labs ?(all labs ordered are listed, but only abnormal results are displayed) ?Labs Reviewed  ?  LACTIC ACID, PLASMA - Abnormal; Notable for the following components:  ?    Result Value  ? Lactic Acid, Venous 2.3 (*)   ? All other components within normal limits  ?COMPREHENSIVE METABOLIC PANEL - Abnormal; Notable for the following components:  ? Sodium 134 (*)   ? Glucose, Bld 314 (*)   ? Creatinine, Ser 1.28 (*)   ? GFR, Estimated 59 (*)   ? All other components within normal limits  ?CBC WITH DIFFERENTIAL/PLATELET - Abnormal; Notable for the following components:  ? WBC 20.9 (*)   ? Hemoglobin 10.9 (*)   ? HCT 34.7 (*)   ? MCH 25.6 (*)   ? RDW 16.8 (*)   ? Neutro Abs 18.3 (*)   ? Monocytes Absolute 1.7 (*)   ? Abs Immature Granulocytes 0.13 (*)   ? All other components within normal limits  ?PROTIME-INR - Abnormal; Notable for the following components:  ? Prothrombin Time 19.1 (*)   ? INR 1.6 (*)   ? All other components within normal limits  ?APTT - Abnormal; Notable for the following components:  ? aPTT 46 (*)   ? All other components within normal limits  ?URINALYSIS, ROUTINE W REFLEX MICROSCOPIC - Abnormal; Notable for the following components:  ? Hgb urine dipstick MODERATE (*)   ? Protein, ur 100 (*)   ? Leukocytes,Ua LARGE (*)   ? All other components within normal limits  ?URINALYSIS, MICROSCOPIC (REFLEX) - Abnormal; Notable for the following components:  ? Bacteria, UA MANY (*)   ? All other  components within normal limits  ?RESP PANEL BY RT-PCR (FLU A&B, COVID) [id number]  ?CULTURE, BLOOD (ROUTINE X 2)  ?CULTURE, BLOOD (ROUTINE X 2)  ?LACTIC ACID, PLASMA  ? ? ?EKG ?None ? ?Radiology ?DG Chest Port 1 View ? ?R

## 2021-08-19 NOTE — Discharge Instructions (Signed)
Go to the emergency department immediately for further evaluation and management ?

## 2021-08-19 NOTE — ED Notes (Signed)
Patient's son will come and take patient to ED. ?

## 2021-08-19 NOTE — ED Triage Notes (Signed)
Fever last night, chills, productive cough with green sputum, some nasal congestion ?

## 2021-08-20 ENCOUNTER — Encounter (HOSPITAL_COMMUNITY): Payer: Self-pay | Admitting: Family Medicine

## 2021-08-20 ENCOUNTER — Other Ambulatory Visit: Payer: Self-pay

## 2021-08-20 DIAGNOSIS — N179 Acute kidney failure, unspecified: Secondary | ICD-10-CM

## 2021-08-20 DIAGNOSIS — R6520 Severe sepsis without septic shock: Secondary | ICD-10-CM

## 2021-08-20 DIAGNOSIS — A419 Sepsis, unspecified organism: Secondary | ICD-10-CM

## 2021-08-20 LAB — CBC
HCT: 28.7 % — ABNORMAL LOW (ref 39.0–52.0)
Hemoglobin: 9.1 g/dL — ABNORMAL LOW (ref 13.0–17.0)
MCH: 26 pg (ref 26.0–34.0)
MCHC: 31.7 g/dL (ref 30.0–36.0)
MCV: 82 fL (ref 80.0–100.0)
Platelets: 281 10*3/uL (ref 150–400)
RBC: 3.5 MIL/uL — ABNORMAL LOW (ref 4.22–5.81)
RDW: 16.7 % — ABNORMAL HIGH (ref 11.5–15.5)
WBC: 17.3 10*3/uL — ABNORMAL HIGH (ref 4.0–10.5)
nRBC: 0 % (ref 0.0–0.2)

## 2021-08-20 LAB — BASIC METABOLIC PANEL
Anion gap: 10 (ref 5–15)
BUN: 16 mg/dL (ref 8–23)
CO2: 22 mmol/L (ref 22–32)
Calcium: 8.3 mg/dL — ABNORMAL LOW (ref 8.9–10.3)
Chloride: 105 mmol/L (ref 98–111)
Creatinine, Ser: 0.95 mg/dL (ref 0.61–1.24)
GFR, Estimated: >60 mL/min (ref >60)
Glucose, Bld: 156 mg/dL — ABNORMAL HIGH (ref 70–99)
Potassium: 3.6 mmol/L (ref 3.5–5.1)
Sodium: 137 mmol/L (ref 135–145)

## 2021-08-20 LAB — GLUCOSE, CAPILLARY
Glucose-Capillary: 132 mg/dL — ABNORMAL HIGH (ref 70–99)
Glucose-Capillary: 138 mg/dL — ABNORMAL HIGH (ref 70–99)
Glucose-Capillary: 157 mg/dL — ABNORMAL HIGH (ref 70–99)
Glucose-Capillary: 158 mg/dL — ABNORMAL HIGH (ref 70–99)
Glucose-Capillary: 182 mg/dL — ABNORMAL HIGH (ref 70–99)

## 2021-08-20 LAB — BLOOD CULTURE ID PANEL (REFLEXED) - BCID2

## 2021-08-20 LAB — TSH: TSH: 1.656 u[IU]/mL (ref 0.350–4.500)

## 2021-08-20 LAB — MRSA NEXT GEN BY PCR, NASAL: MRSA by PCR Next Gen: NOT DETECTED

## 2021-08-20 LAB — HEMOGLOBIN A1C
Hgb A1c MFr Bld: 7.2 % — ABNORMAL HIGH (ref 4.8–5.6)
Mean Plasma Glucose: 159.94 mg/dL

## 2021-08-20 LAB — STREP PNEUMONIAE URINARY ANTIGEN: Strep Pneumo Urinary Antigen: NEGATIVE

## 2021-08-20 MED ORDER — DILTIAZEM HCL-DEXTROSE 125-5 MG/125ML-% IV SOLN (PREMIX)
5.0000 mg/h | INTRAVENOUS | Status: DC
  Administered 2021-08-20: 5 mg/h via INTRAVENOUS
  Administered 2021-08-21: 10 mg/h via INTRAVENOUS
  Filled 2021-08-20 (×5): qty 125

## 2021-08-20 MED ORDER — DILTIAZEM HCL 25 MG/5ML IV SOLN
10.0000 mg | Freq: Once | INTRAVENOUS | Status: AC
  Administered 2021-08-20: 10 mg via INTRAVENOUS
  Filled 2021-08-20: qty 5

## 2021-08-20 MED ORDER — LABETALOL HCL 5 MG/ML IV SOLN
10.0000 mg | INTRAVENOUS | Status: DC | PRN
Start: 2021-08-20 — End: 2021-08-23
  Administered 2021-08-20 (×2): 10 mg via INTRAVENOUS
  Filled 2021-08-20 (×2): qty 4

## 2021-08-20 MED ORDER — METOPROLOL TARTRATE 50 MG PO TABS
50.0000 mg | ORAL_TABLET | Freq: Two times a day (BID) | ORAL | Status: DC
  Administered 2021-08-20 – 2021-08-21 (×3): 50 mg via ORAL
  Filled 2021-08-20 (×3): qty 1

## 2021-08-20 MED ORDER — CHLORHEXIDINE GLUCONATE CLOTH 2 % EX PADS
6.0000 | MEDICATED_PAD | Freq: Every day | CUTANEOUS | Status: DC
  Administered 2021-08-21: 6 via TOPICAL

## 2021-08-20 NOTE — Progress Notes (Signed)
?   08/20/21 0343  ?Vitals  ?Temp (!) 100.4 ?F (38 ?C)  ?Temp Source Oral  ?BP (!) 141/74  ?MAP (mmHg) 96  ?BP Location Left Arm  ?BP Method Automatic  ?Patient Position (if appropriate) Sitting  ?Pulse Rate (!) 117  ?Pulse Rate Source Monitor  ?Resp 18  ?MEWS COLOR  ?MEWS Score Color Yellow  ?Oxygen Therapy  ?SpO2 95 %  ?O2 Device Room Air  ?Pain Assessment  ?Pain Scale 0-10  ?Pain Score 0  ?MEWS Score  ?MEWS Temp 0  ?MEWS Systolic 0  ?MEWS Pulse 2  ?MEWS RR 0  ?MEWS LOC 0  ?MEWS Score 2  ?Provider Notification  ?Provider Name/Title Adefeso  ?Date Provider Notified 08/20/21  ?Time Provider Notified [phone]  ?Method of Notification Page  ?Notification Reason Other (Comment) ?(Afib RVR, temp elevated)  ? ?Patients heart rate sustaining 110's to 120's on telemetry monitor. Patient resting in bed. Patient stated he wasn't sure when the last time he took his metoprolol was. Dr. Josephine Cables notified. No new orders at this time. Given Tylenol for temp.  ?

## 2021-08-20 NOTE — Progress Notes (Signed)
?   08/20/21 1338  ?Assess: MEWS Score  ?Temp 99.8 ?F (37.7 ?C)  ?BP (!) 192/70  ?Pulse Rate (!) 120  ?Resp 20  ?SpO2 94 %  ?O2 Device Room Air  ?Assess: MEWS Score  ?MEWS Temp 0  ?MEWS Systolic 0  ?MEWS Pulse 2  ?MEWS RR 0  ?MEWS LOC 0  ?MEWS Score 2  ?MEWS Score Color Yellow  ?Treat  ?Pain Scale 0-10  ?Pain Score 0  ?Notify: Charge Nurse/RN  ?Name of Charge Nurse/RN Notified Mable Fill, RN  ?Date Charge Nurse/RN Notified 08/20/21  ?Time Charge Nurse/RN Notified 1341  ?Notify: Provider  ?Provider Name/Title Dr. Manuella Ghazi, DO  ?Date Provider Notified 08/20/21  ?Time Provider Notified 1341  ?Notification Type Page  ?Notification Reason Other (Comment) ?(Patient back to a yellow mews d/t BP and HR elevated.)  ?Provider response See new orders  ?Date of Provider Response 08/20/21  ?Time of Provider Response 1342  ? ? ?

## 2021-08-20 NOTE — Progress Notes (Signed)
?PROGRESS NOTE ? ? ? ?CAMERAN PETTEY  OFB:[id number] DOB: 03/30/1946 DOA: 08/19/2021 ?PCP: [facility] ? ? ?Brief Narrative:  ?Per HPI: ?Darrell Lang is a 76 y.o. male with medical history significant of atrial fibrillation, hypertension, diabetes, anxiety depression on multiple antipsychotics.  Patient seen for productive cough with green sputum, fevers, chills that started yesterday.  Patient denies shortness of breath or chest pain.  He did go to urgent care today to be evaluated, however he was sent to the emergency department due to his heart rate being in rapid ventricular response.  Here, the patient received 2 L of fluid bolus and his heart rate decreased into normal range.  He had a chest x-ray which did not show an acute infection.  He received Rocephin and azithromycin.  His UA does show evidence of infection and his white count is 20.  Lactic acid was slightly elevated initially, but improved on repeat.  He denies dysuria.  He does have some mild frequency. ? ?-Patient was admitted with sepsis, present on admission secondary to UTI as well as concern for acute lower respiratory infection.  He remains on Rocephin and azithromycin.  Gram-negative rods are noted in a set of blood cultures with further ID and sensitivity pending.   ? ? ?Assessment & Plan: ?  ?Principal Problem: ?  Sepsis ([place]) ?Active Problems: ?  Essential hypertension ?  Diabetes ([place]) ?  Hyperthyroidism ?  Sepsis secondary to UTI [facility]) ?  Acute lower respiratory infection ?  Atrial fibrillation, chronic ([place]) ? ?Assessment and Plan: ? ? ?Sepsis ?S/p fluid resuscitation ?Continue IV fluids.  Lactic acid improved. ?Check CBC in the morning ?Blood cultures with gram-negative rods in 1 set, continue to follow ?UTI ?Urine culture ?Last urine culture in our system was 05/14/2021 which showed sensitivity to Rocephin, ciprofloxacin, Bactrim ?Continue Rocephin ?  ?Acute lower respiratory infection ?Checks x-ray does not show acute  infection. ?However, given presentation and purulent sputum, will treat with azithromycin in addition to the Rocephin. ?Check sputum cultures ?  ?Diabetes type 2 on oral medications ?Hold oral hypoglycemics ?Start long-acting insulin 0.2 units/kg ?Sliding scale insulin ?CBG before meals and nightly ?Hypertension ?Continue antihypertensives ?Atrial fibrillation ?Continue metoprolol and Eliquis ?Heart rate control has improved on telemetry ?Hyperthyroidism ?Patient on Tapazole ?  ? ?DVT prophylaxis: Apixaban ?Code Status: Full ?Family Communication: None at bedside ?Disposition Plan:  ?Status is: Inpatient ?Remains inpatient appropriate because: Continues to require IV medications for treatment. ? ?Consultants:  ?None ? ?Procedures:  ?None ? ?Antimicrobials:  ?Anti-infectives (From admission, onward)  ? ? Start     Dose/Rate Route Frequency Ordered Stop  ? 08/19/21 1815  cefTRIAXone (ROCEPHIN) 2 g in sodium chloride 0.9 % 100 mL IVPB       ? 2 g ?200 mL/hr over 30 Minutes Intravenous Every 24 hours 08/19/21 1808 08/24/21 1814  ? 08/19/21 1815  azithromycin (ZITHROMAX) 500 mg in sodium chloride 0.9 % 250 mL IVPB       ? 500 mg ?250 mL/hr over 60 Minutes Intravenous Every 24 hours 08/19/21 1808 08/24/21 1814  ? ?  ? ? ?Subjective: ?Patient seen and evaluated today with no new acute complaints or concerns. No acute concerns or events noted overnight.  Noted to have fever last night which has resolved.  Heart rates have improved. ? ?Objective: ?Vitals:  ? 08/19/21 2339 08/20/21 7824 08/20/21 0515 08/20/21 0603  ?BP: 129/66 (!) 141/74 (!) 153/67   ?Pulse: (!) 105 (!) 117 (!) 129 Marland Kitchen)  122  ?Resp: '16 18 20   '$ ?Temp: 98.5 ?F (36.9 ?C) (!) 100.4 ?F (38 ?C) 100 ?F (37.8 ?C)   ?TempSrc: Oral Oral Oral   ?SpO2: 96% 95% 94%   ?Weight: 86.9 kg     ?Height: '5\' 11"'$  (1.803 m)     ? ? ?Intake/Output Summary (Last 24 hours) at 08/20/2021 1037 ?Last data filed at 08/20/2021 0900 ?Gross per 24 hour  ?Intake 2728.33 ml  ?Output 700 ml  ?Net  2028.33 ml  ? ?Filed Weights  ? 08/19/21 1754 08/19/21 2339  ?Weight: 86.2 kg 86.9 kg  ? ? ?Examination: ? ?General exam: Appears calm and comfortable  ?Respiratory system: Clear to auscultation. Respiratory effort normal. ?Cardiovascular system: S1 & S2 heard, RRR.  ?Gastrointestinal system: Abdomen is soft ?Central nervous system: Alert and awake ?Extremities: No edema ?Skin: No significant lesions noted ?Psychiatry: Flat affect. ? ? ? ?Data Reviewed: I have personally reviewed following labs and imaging studies ? ?CBC: ?Recent Labs  ?Lab 08/19/21 ?1809 08/20/21 ?0410  ?WBC 20.9* 17.3*  ?NEUTROABS 18.3*  --   ?HGB 10.9* 9.1*  ?HCT 34.7* 28.7*  ?MCV 81.5 82.0  ?PLT 353 281  ? ?Basic Metabolic Panel: ?Recent Labs  ?Lab 08/19/21 ?1809 08/20/21 ?0410  ?NA 134* 137  ?K 3.8 3.6  ?CL 100 105  ?CO2 22 22  ?GLUCOSE 314* 156*  ?BUN 19 16  ?CREATININE 1.28* 0.95  ?CALCIUM 9.0 8.3*  ? ?GFR: ?Estimated Creatinine Clearance: 73.8 mL/min (by C-G formula based on SCr of 0.95 mg/dL). ?Liver Function Tests: ?Recent Labs  ?Lab 08/19/21 ?1809  ?AST 34  ?ALT 24  ?ALKPHOS 117  ?BILITOT 0.6  ?PROT 8.0  ?ALBUMIN 3.6  ? ?No results for input(s): LIPASE, AMYLASE in the last 168 hours. ?No results for input(s): AMMONIA in the last 168 hours. ?Coagulation Profile: ?Recent Labs  ?Lab 08/19/21 ?1809  ?INR 1.6*  ? ?Cardiac Enzymes: ?No results for input(s): CKTOTAL, CKMB, CKMBINDEX, TROPONINI in the last 168 hours. ?BNP (last 3 results) ?No results for input(s): PROBNP in the last 8760 hours. ?HbA1C: ?No results for input(s): HGBA1C in the last 72 hours. ?CBG: ?Recent Labs  ?Lab 08/20/21 ?0000 08/20/21 ?2395  ?GLUCAP 138* 157*  ? ?Lipid Profile: ?No results for input(s): CHOL, HDL, LDLCALC, TRIG, CHOLHDL, LDLDIRECT in the last 72 hours. ?Thyroid Function Tests: ?No results for input(s): TSH, T4TOTAL, FREET4, T3FREE, THYROIDAB in the last 72 hours. ?Anemia Panel: ?No results for input(s): VITAMINB12, FOLATE, FERRITIN, TIBC, IRON, RETICCTPCT in  the last 72 hours. ?Sepsis Labs: ?Recent Labs  ?Lab 08/19/21 ?1826 08/19/21 ?1946  ?LATICACIDVEN 2.3* 1.6  ? ? ?Recent Results (from the past 240 hour(s))  ?Resp Panel by RT-PCR (Flu A&B, Covid) Nasopharyngeal Swab     Status: None  ? Collection Time: 08/19/21  6:09 PM  ? Specimen: Nasopharyngeal Swab; Nasopharyngeal(NP) swabs in vial transport medium  ?Result Value Ref Range Status  ? SARS Coronavirus 2 by RT PCR NEGATIVE NEGATIVE Final  ?  Comment: (NOTE) ?SARS-CoV-2 target nucleic acids are NOT DETECTED. ? ?The SARS-CoV-2 RNA is generally detectable in upper respiratory ?specimens during the acute phase of infection. The lowest ?concentration of SARS-CoV-2 viral copies this assay can detect is ?138 copies/mL. A negative result does not preclude SARS-Cov-2 ?infection and should not be used as the sole basis for treatment or ?other patient management decisions. A negative result may occur with  ?improper specimen collection/handling, submission of specimen other ?than nasopharyngeal swab, presence of viral mutation(s) within the ?areas  targeted by this assay, and inadequate number of viral ?copies(<138 copies/mL). A negative result must be combined with ?clinical observations, patient history, and epidemiological ?information. The expected result is Negative. ? ?Fact Sheet for Patients:  ?EntrepreneurPulse.com.au ? ?Fact Sheet for Healthcare Providers:  ?IncredibleEmployment.be ? ?This test is no t yet approved or cleared by the [place] FDA and  ?has been authorized for detection and/or diagnosis of SARS-CoV-2 by ?FDA under an Emergency Use Authorization (EUA). This EUA will remain  ?in effect (meaning this test can be used) for the duration of the ?COVID-19 declaration under Section 564(b)(1) of the Act, 21 ?U.S.C.section 360bbb-3(b)(1), unless the authorization is terminated  ?or revoked sooner.  ? ? ?  ? Influenza A by PCR NEGATIVE NEGATIVE Final  ? Influenza B by PCR  NEGATIVE NEGATIVE Final  ?  Comment: (NOTE) ?The Xpert Xpress SARS-CoV-2/FLU/RSV plus assay is intended as an aid ?in the diagnosis of influenza from Nasopharyngeal swab specimens and ?should not be used as a sole

## 2021-08-20 NOTE — Progress Notes (Signed)
PHARMACY - PHYSICIAN COMMUNICATION ?CRITICAL VALUE ALERT - BLOOD CULTURE IDENTIFICATION (BCID) ? ?Darrell Lang is an 76 y.o. male who presented to [facility] on 08/19/2021 with a chief complaint of fevers and chills ? ?Assessment:  1 aerobic bottle klebsiella no resistance, 3 bottles no growth (include suspected source if known) ? ?Name of physician (or Provider) Contacted: Dr Manuella Ghazi ? ?Current antibiotics: ceftriaxone and azithromycin ? ?Changes to prescribed antibiotics recommended:  ?Patient is on recommended antibiotics - No changes needed ? ?No results found for this or any previous visit. ? ?[name] ?08/20/2021  2:00 PM ? ?

## 2021-08-20 NOTE — Progress Notes (Signed)
Patients aerobic blood cultures came back as gram negative rods. Notified MD ? ?

## 2021-08-20 NOTE — Progress Notes (Signed)
?   08/20/21 0603  ?Vitals  ?Pulse Rate (!) 122  ?MEWS COLOR  ?MEWS Score Color Yellow  ?MEWS Score  ?MEWS Temp 0  ?MEWS Systolic 0  ?MEWS Pulse 2  ?MEWS RR 0  ?MEWS LOC 0  ?MEWS Score 2  ? ? ?HR Remains elevated, afib. Dr. Josephine Cables notified. Instructed by MD to give scheduled morning Metoprolol 25 mg PO early. Medication administered.  ?

## 2021-08-20 NOTE — Progress Notes (Signed)
?   08/20/21 0515  ?Vitals  ?Temp 100 ?F (37.8 ?C)  ?Temp Source Oral  ?BP (!) 153/67  ?MAP (mmHg) 91  ?BP Location Left Arm  ?BP Method Automatic  ?Patient Position (if appropriate) Lying  ?Pulse Rate (!) 129  ?Pulse Rate Source Monitor  ?ECG Heart Rate (!) 129  ?Resp 20  ?MEWS COLOR  ?MEWS Score Color Yellow  ?Oxygen Therapy  ?SpO2 94 %  ?O2 Device Room Air  ?MEWS Score  ?MEWS Temp 0  ?MEWS Systolic 0  ?MEWS Pulse 2  ?MEWS RR 0  ?MEWS LOC 0  ?MEWS Score 2  ?Provider Notification  ?Provider Name/Title Adefeso  ?Date Provider Notified 08/20/21  ?Time Provider Notified [phone]  ?Method of Notification Page  ? ? ?

## 2021-08-20 NOTE — Progress Notes (Signed)
Patient new admit to [address]. Patient stated that he self catheterizes himself every morning and then is able to urinate on his own the rest of the day. Instructed patient that we do need another urine sample and given supplies. Dr. Josephine Cables notified of this. Patient also given sputum culture container and given instruction.  ?

## 2021-08-20 NOTE — Hospital Course (Addendum)
Per HPI: ?Darrell Lang is a 76 y.o. male with medical history significant of atrial fibrillation, hypertension, diabetes, anxiety depression on multiple antipsychotics.  Patient seen for productive cough with green sputum, fevers, chills that started yesterday.  Patient denies shortness of breath or chest pain.  He did go to urgent care today to be evaluated, however he was sent to the emergency department due to his heart rate being in rapid ventricular response.  Here, the patient received 2 L of fluid bolus and his heart rate decreased into normal range.  He had a chest x-ray which did not show an acute infection.  He received Rocephin and azithromycin.  His UA does show evidence of infection and his white count is 20.  Lactic acid was slightly elevated initially, but improved on repeat.  He denies dysuria.  He does have some mild frequency. ? ?-Patient was admitted with sepsis, present on admission secondary to UTI as well as concern for acute lower respiratory infection.  He remains on Rocephin and azithromycin.  Gram-negative rods with Klebsiella pneumonia noted in 1 out of 4 bottles.  He has gone into A-fib with RVR requiring Cardizem drip on 4/8, but this now appears resolved and he has transitioned to oral Cardizem.  He may transfer to telemetry unit if he continues to remain stable today.  2D echocardiogram ordered and pending.  TSH within normal limits ?

## 2021-08-21 ENCOUNTER — Inpatient Hospital Stay (HOSPITAL_COMMUNITY): Payer: No Typology Code available for payment source

## 2021-08-21 ENCOUNTER — Other Ambulatory Visit (HOSPITAL_COMMUNITY): Payer: Self-pay | Admitting: *Deleted

## 2021-08-21 DIAGNOSIS — I4891 Unspecified atrial fibrillation: Secondary | ICD-10-CM

## 2021-08-21 LAB — IRON AND TIBC
Iron: 9 ug/dL — ABNORMAL LOW (ref 45–182)
Saturation Ratios: 3 % — ABNORMAL LOW (ref 17.9–39.5)
TIBC: 259 ug/dL (ref 250–450)
UIBC: 250 ug/dL

## 2021-08-21 LAB — EXPECTORATED SPUTUM ASSESSMENT W GRAM STAIN, RFLX TO RESP C

## 2021-08-21 LAB — CBC
HCT: 27.6 % — ABNORMAL LOW (ref 39.0–52.0)
Hemoglobin: 8.4 g/dL — ABNORMAL LOW (ref 13.0–17.0)
MCH: 25 pg — ABNORMAL LOW (ref 26.0–34.0)
MCHC: 30.4 g/dL (ref 30.0–36.0)
MCV: 82.1 fL (ref 80.0–100.0)
Platelets: 270 10*3/uL (ref 150–400)
RBC: 3.36 MIL/uL — ABNORMAL LOW (ref 4.22–5.81)
RDW: 16.8 % — ABNORMAL HIGH (ref 11.5–15.5)
WBC: 12.9 10*3/uL — ABNORMAL HIGH (ref 4.0–10.5)
nRBC: 0 % (ref 0.0–0.2)

## 2021-08-21 LAB — ECHOCARDIOGRAM COMPLETE
Area-P 1/2: 4.31 cm2
Calc EF: 62.6 %
Height: 71 in
MV M vel: 4.43 m/s
MV Peak grad: 78.5 mmHg
MV VTI: 2.03 cm2
Radius: 0.5 cm
S' Lateral: 3.2 cm
Single Plane A2C EF: 60.3 %
Single Plane A4C EF: 64.3 %
Weight: 3104.08 oz

## 2021-08-21 LAB — URINE CULTURE: Culture: <10000 — AB

## 2021-08-21 LAB — RETICULOCYTES
Immature Retic Fract: 14.3 % (ref 2.3–15.9)
RBC.: 3.29 MIL/uL — ABNORMAL LOW (ref 4.22–5.81)
Retic Count, Absolute: 36.8 10*3/uL (ref 19.0–186.0)
Retic Ct Pct: 1.1 % (ref 0.4–3.1)

## 2021-08-21 LAB — VITAMIN B12: Vitamin B-12: 330 pg/mL (ref 180–914)

## 2021-08-21 LAB — BASIC METABOLIC PANEL
Anion gap: 11 (ref 5–15)
BUN: 15 mg/dL (ref 8–23)
CO2: 20 mmol/L — ABNORMAL LOW (ref 22–32)
Calcium: 8 mg/dL — ABNORMAL LOW (ref 8.9–10.3)
Chloride: 105 mmol/L (ref 98–111)
Creatinine, Ser: 0.94 mg/dL (ref 0.61–1.24)
GFR, Estimated: >60 mL/min (ref >60)
Glucose, Bld: 209 mg/dL — ABNORMAL HIGH (ref 70–99)
Potassium: 3.7 mmol/L (ref 3.5–5.1)
Sodium: 136 mmol/L (ref 135–145)

## 2021-08-21 LAB — GLUCOSE, CAPILLARY
Glucose-Capillary: 208 mg/dL — ABNORMAL HIGH (ref 70–99)
Glucose-Capillary: 241 mg/dL — ABNORMAL HIGH (ref 70–99)
Glucose-Capillary: 236 mg/dL — ABNORMAL HIGH (ref 70–99)
Glucose-Capillary: 207 mg/dL — ABNORMAL HIGH (ref 70–99)

## 2021-08-21 LAB — FERRITIN: Ferritin: 142 ng/mL (ref 24–336)

## 2021-08-21 LAB — MAGNESIUM: Magnesium: 1.5 mg/dL — ABNORMAL LOW (ref 1.7–2.4)

## 2021-08-21 LAB — FOLATE: Folate: 17.9 ng/mL (ref >5.9)

## 2021-08-21 MED ORDER — DILTIAZEM HCL 30 MG PO TABS
30.0000 mg | ORAL_TABLET | Freq: Four times a day (QID) | ORAL | Status: DC
  Administered 2021-08-21 – 2021-08-22 (×4): 30 mg via ORAL
  Filled 2021-08-21 (×4): qty 1

## 2021-08-21 MED ORDER — MAGNESIUM SULFATE 2 GM/50ML IV SOLN
2.0000 g | Freq: Once | INTRAVENOUS | Status: AC
  Administered 2021-08-21: 2 g via INTRAVENOUS
  Filled 2021-08-21: qty 50

## 2021-08-21 NOTE — Progress Notes (Signed)
?PROGRESS NOTE ? ? ? ?Darrell Lang  WHQ:[id number] DOB: 20-Sep-1945 DOA: 08/19/2021 ?PCP: [facility] ? ? ?Brief Narrative:  ?Per HPI: ?Darrell Lang is a 76 y.o. male with medical history significant of atrial fibrillation, hypertension, diabetes, anxiety depression on multiple antipsychotics.  Patient seen for productive cough with green sputum, fevers, chills that started yesterday.  Patient denies shortness of breath or chest pain.  He did go to urgent care today to be evaluated, however he was sent to the emergency department due to his heart rate being in rapid ventricular response.  Here, the patient received 2 L of fluid bolus and his heart rate decreased into normal range.  He had a chest x-ray which did not show an acute infection.  He received Rocephin and azithromycin.  His UA does show evidence of infection and his white count is 20.  Lactic acid was slightly elevated initially, but improved on repeat.  He denies dysuria.  He does have some mild frequency. ? ?-Patient was admitted with sepsis, present on admission secondary to UTI as well as concern for acute lower respiratory infection.  He remains on Rocephin and azithromycin.  Gram-negative rods with Klebsiella pneumonia noted in 1 out of 4 bottles.  He has gone into A-fib with RVR requiring Cardizem drip on 4/8, but this now appears resolved and he has transitioned to oral Cardizem.  He may transfer to telemetry unit if he continues to remain stable today.  2D echocardiogram ordered and pending.  TSH within normal limits  ? ? ?Assessment & Plan: ?  ?Principal Problem: ?  Sepsis ([place]) ?Active Problems: ?  Essential hypertension ?  Diabetes ([place]) ?  Hyperthyroidism ?  Sepsis secondary to UTI [facility]) ?  Acute lower respiratory infection ?  Atrial fibrillation, chronic ([place]) ? ?Assessment and Plan: ? ? ?Sepsis, present on admission secondary to UTI and suspected bronchitis ?S/p fluid resuscitation ?Continue IV fluids.  Lactic acid  improved. ?Check CBC in the morning ?Blood cultures with Klebsiella pneumonia in one set ?Continues to have fever overnight which is likely what caused A-fib with RVR ?Leukocytosis improving ?UTI ?Urine culture with insignificant growth ?Last urine culture in our system was 05/14/2021 which showed sensitivity to Rocephin, ciprofloxacin, Bactrim ?Continue Rocephin ?  ?Acute lower respiratory infection ?Checks x-ray does not show acute infection. ?However, given presentation and purulent sputum, will treat with azithromycin in addition to the Rocephin. ?Check sputum cultures, pending ?  ?Diabetes type 2 on oral medications ?Hold oral hypoglycemics ?Start long-acting insulin 0.2 units/kg ?Sliding scale insulin ?CBG before meals and nightly ?Hypertension ?Continue antihypertensives ?Atrial fibrillation with RVR-improved ?Continue Eliquis for anticoagulation ?Metoprolol increased from 25 twice daily to 50 twice daily ?Weaned off IV Cardizem on 4/9 and started on Cardizem 30 mg every 6 hours ?Heart rate control has improved and may transfer to telemetry if he remains controlled ?TSH within normal limits, 2D echocardiogram ordered and pending ?Hyperthyroidism ?Patient on Tapazole ?TSH 1.6 ?8.   Hypomagnesemia ? -Replete and re-eval in am ?9.   Worsening anemia ?            -Continue to trend and monitor CBC.  Anemia panel with some mild iron deficiency noted.  Avoid iron supplementation at this time given active infection. ? -Transfuse for hemoglobin less than 7; okay to continue Eliquis with no active bleeding noted ?  ?  ?DVT prophylaxis: Apixaban ?Code Status: Full ?Family Communication: None at bedside ?Disposition Plan:  ?Status is: Inpatient ?Remains inpatient appropriate  because: Continues to require IV medications for treatment. ?  ?Consultants:  ?None ?  ?Procedures:  ?None ?  ?Antimicrobials:  ?Anti-infectives (From admission, onward)  ? ? Start     Dose/Rate Route Frequency Ordered Stop  ? 08/19/21 1815   cefTRIAXone (ROCEPHIN) 2 g in sodium chloride 0.9 % 100 mL IVPB       ? 2 g ?200 mL/hr over 30 Minutes Intravenous Every 24 hours 08/19/21 1808 08/24/21 1814  ? 08/19/21 1815  azithromycin (ZITHROMAX) 500 mg in sodium chloride 0.9 % 250 mL IVPB       ? 500 mg ?250 mL/hr over 60 Minutes Intravenous Every 24 hours 08/19/21 1808 08/24/21 1814  ? ?  ? ? ?Subjective: ?Patient seen and evaluated today with improvement in heart rate control noted with IV Cardizem drip.  He denies any chest pain, palpitations, or shortness of breath.  Patient was noted to be febrile overnight.  No other acute events noted. ? ?Objective: ?Vitals:  ? 08/21/21 0900 08/21/21 1000 08/21/21 1135 08/21/21 1151  ?BP: (!) 112/44 (!) 87/70    ?Pulse: 73 77 (!) 44 90  ?Resp: 20 19 (!) 24 17  ?Temp:   (!) 96.3 ?F (35.7 ?C)   ?TempSrc:   Oral   ?SpO2: 95% 95% 93%   ?Weight:      ?Height:      ? ? ?Intake/Output Summary (Last 24 hours) at 08/21/2021 1226 ?Last data filed at 08/21/2021 4944 ?Gross per 24 hour  ?Intake 3564.52 ml  ?Output 450 ml  ?Net 3114.52 ml  ? ?Filed Weights  ? 08/19/21 2339 08/20/21 1736 08/21/21 0500  ?Weight: 86.9 kg 86.4 kg 88 kg  ? ? ?Examination: ? ?General exam: Appears calm and comfortable  ?Respiratory system: Clear to auscultation. Respiratory effort normal. ?Cardiovascular system: S1 & S2 heard, irregular rate. ?Gastrointestinal system: Abdomen is soft ?Central nervous system: Alert and awake ?Extremities: No edema ?Skin: No significant lesions noted ?Psychiatry: Flat affect. ? ? ? ?Data Reviewed: I have personally reviewed following labs and imaging studies ? ?CBC: ?Recent Labs  ?Lab 08/19/21 ?1809 08/20/21 ?0410 08/21/21 ?9675  ?WBC 20.9* 17.3* 12.9*  ?NEUTROABS 18.3*  --   --   ?HGB 10.9* 9.1* 8.4*  ?HCT 34.7* 28.7* 27.6*  ?MCV 81.5 82.0 82.1  ?PLT 353 281 270  ? ?Basic Metabolic Panel: ?Recent Labs  ?Lab 08/19/21 ?1809 08/20/21 ?0410 08/21/21 ?9163  ?NA 134* 137 136  ?K 3.8 3.6 3.7  ?CL 100 105 105  ?CO2 22 22 20*  ?GLUCOSE  314* 156* 209*  ?BUN '19 16 15  '$ ?CREATININE 1.28* 0.95 0.94  ?CALCIUM 9.0 8.3* 8.0*  ?MG  --   --  1.5*  ? ?GFR: ?Estimated Creatinine Clearance: 74.5 mL/min (by C-G formula based on SCr of 0.94 mg/dL). ?Liver Function Tests: ?Recent Labs  ?Lab 08/19/21 ?1809  ?AST 34  ?ALT 24  ?ALKPHOS 117  ?BILITOT 0.6  ?PROT 8.0  ?ALBUMIN 3.6  ? ?No results for input(s): LIPASE, AMYLASE in the last 168 hours. ?No results for input(s): AMMONIA in the last 168 hours. ?Coagulation Profile: ?Recent Labs  ?Lab 08/19/21 ?1809  ?INR 1.6*  ? ?Cardiac Enzymes: ?No results for input(s): CKTOTAL, CKMB, CKMBINDEX, TROPONINI in the last 168 hours. ?BNP (last 3 results) ?No results for input(s): PROBNP in the last 8760 hours. ?HbA1C: ?Recent Labs  ?  08/19/21 ?1809  ?HGBA1C 7.2*  ? ?CBG: ?Recent Labs  ?Lab 08/20/21 ?1136 08/20/21 ?1628 08/20/21 ?2129 08/21/21 ?0756 08/21/21 ?1153  ?  GLUCAP 132* 158* 182* 208* 241*  ? ?Lipid Profile: ?No results for input(s): CHOL, HDL, LDLCALC, TRIG, CHOLHDL, LDLDIRECT in the last 72 hours. ?Thyroid Function Tests: ?Recent Labs  ?  08/20/21 ?1714  ?TSH 1.656  ? ?Anemia Panel: ?Recent Labs  ?  08/21/21 ?3734 08/21/21 ?2876  ?VITAMINB12  --  330  ?FOLATE  --  17.9  ?FERRITIN  --  142  ?TIBC  --  259  ?IRON  --  9*  ?RETICCTPCT 1.1  --   ? ?Sepsis Labs: ?Recent Labs  ?Lab 08/19/21 ?1826 08/19/21 ?1946  ?LATICACIDVEN 2.3* 1.6  ? ? ?Recent Results (from the past 240 hour(s))  ?Resp Panel by RT-PCR (Flu A&B, Covid) Nasopharyngeal Swab     Status: None  ? Collection Time: 08/19/21  6:09 PM  ? Specimen: Nasopharyngeal Swab; Nasopharyngeal(NP) swabs in vial transport medium  ?Result Value Ref Range Status  ? SARS Coronavirus 2 by RT PCR NEGATIVE NEGATIVE Final  ?  Comment: (NOTE) ?SARS-CoV-2 target nucleic acids are NOT DETECTED. ? ?The SARS-CoV-2 RNA is generally detectable in upper respiratory ?specimens during the acute phase of infection. The lowest ?concentration of SARS-CoV-2 viral copies this assay can detect  is ?138 copies/mL. A negative result does not preclude SARS-Cov-2 ?infection and should not be used as the sole basis for treatment or ?other patient management decisions. A negative result may occur with  ?improper specimen

## 2021-08-21 NOTE — Progress Notes (Signed)
Tele called with RVR in 140's.  Now afib 121 on monitor.  Sent chat to Dr. Josephine Cables.  ?

## 2021-08-21 NOTE — ED Provider Notes (Signed)
?[facility] ? ? ? ?CSN: [id number] ?Arrival date & time: 08/19/21  1653 ? ? ?  ? ?History   ?Chief Complaint ?No chief complaint on file. ? ? ?HPI ?Darrell Lang is a 76 y.o. male.  ? ?Presenting today with 1 day history of fever last night, chills, cough, congestion.  He denies any chest pain, shortness of breath, dizziness, lightheadedness, abdominal pain, nausea vomiting or diarrhea.  So far trying over-the-counter cold remedies with no relief.  No known sick contacts recently.  History significant for hypertension, hyperlipidemia, diabetes, hypothyroid, atrial fibrillation with history of RVR on metoprolol, liquids. ? ? ?Past Medical History:  ?Diagnosis Date  ? Anxiety and depression   ? Atrial fibrillation with RVR ([place]) 02/08/2019  ? COVID-19 virus infection 02/09/2019  ? Diabetes ([place])   ? Encounter for screening colonoscopy 07/28/2013  ? Essential hypertension 02/09/2019  ? Family history of adverse reaction to anesthesia   ? Hyperlipidemia 02/09/2019  ? Hypertension   ? Hypertriglyceridemia   ? ? ?Patient Active Problem List  ? Diagnosis Date Noted  ? Sepsis ([place]) 08/19/2021  ? Sepsis secondary to UTI ([place]) 08/19/2021  ? Acute lower respiratory infection 08/19/2021  ? Atrial fibrillation, chronic ([place]) 08/19/2021  ? Normocytic anemia 06/23/2021  ? Heme positive stool 06/23/2021  ? Hyperthyroidism 03/11/2019  ? COVID-19 virus infection 02/09/2019  ? Essential hypertension 02/09/2019  ? Hyperlipidemia 02/09/2019  ? Diabetes ([place]) 02/09/2019  ? Atrial fibrillation with RVR ([place]) 02/08/2019  ? Encounter for screening colonoscopy 07/28/2013  ? ? ?Past Surgical History:  ?Procedure Laterality Date  ? APPENDECTOMY    ? 6th grade  ? COLONOSCOPY  01/14/2004  ? Dr. Gala Romney: internal hemorrhoids, otherwise normal rectum. Normal colon and normal TI  ? COLONOSCOPY N/A 08/06/2013  ? normal, internal hemorrhoids  ? COLONOSCOPY WITH PROPOFOL N/A 07/04/2021  ? Procedure: COLONOSCOPY WITH PROPOFOL;  Surgeon:  [name], DO;  [facility];  Service: Endoscopy;  Laterality: N/A;  2:15pm  ? ESOPHAGOGASTRODUODENOSCOPY (EGD) WITH PROPOFOL N/A 07/04/2021  ? Procedure: ESOPHAGOGASTRODUODENOSCOPY (EGD) WITH PROPOFOL;  Surgeon: [name], DO;  [facility];  Service: Endoscopy;  Laterality: N/A;  ? POLYPECTOMY  07/04/2021  ? Procedure: POLYPECTOMY;  Surgeon: [name], DO;  [facility];  Service: Endoscopy;;  ? ? ? ?Home Medications   ? ?Prior to Admission medications   ?Medication Sig Start Date End Date Taking? Authorizing Provider  ?amLODipine (NORVASC) 5 MG tablet Take 1 tablet (5 mg total) by mouth daily. 08/12/19   Richardson Dopp T, PA-C  ?apixaban (ELIQUIS) 5 MG TABS tablet Take 1 tablet (5 mg total) by mouth 2 (two) times daily. 02/24/19   Nahser, Wonda Cheng, MD  ?atorvastatin (LIPITOR) 10 MG tablet Take 10 mg by mouth daily. 06/21/20   [provider]  ?benztropine (COGENTIN) 1 MG tablet Take 1 mg by mouth at bedtime.    [provider]  ?busPIRone (BUSPAR) 5 MG tablet Take 2.5 mg by mouth daily. 09/21/20   [provider]  ?carboxymethylcellulose (REFRESH PLUS) 0.5 % SOLN Place 1 drop into both eyes 2 (two) times daily as needed (dry eye). 04/11/21   [provider]  ?Carboxymethylcellulose Sodium 1 % GEL Place 1 drop into both eyes as needed (eye glued together). 12/09/20   [provider]  ?Cholecalciferol 50 MCG (2000 UT) TABS Take 2,000 Units by mouth 3 (three) times a week. 06/21/20   [provider]  ?citalopram (  CELEXA) 20 MG tablet Take 20 mg by mouth daily.    [provider]  ?finasteride (PROSCAR) 5 MG tablet Take 5 mg by mouth daily. 06/21/20   [provider]  ?fluticasone (FLONASE) 50 MCG/ACT nasal spray Place 2 sprays into both nostrils daily. ?Patient not taking: Reported on 05/14/2021 02/15/21   Melynda Ripple, MD  ?gabapentin (NEURONTIN) 300 MG capsule Take 300 mg by mouth 2 (two) times  daily. 05/11/20   [provider]  ?glipiZIDE (GLUCOTROL) 5 MG tablet Take 2.5-5 mg by mouth See admin instructions. Take 5 mg in the morning and 2.5 mg every evening    [provider]  ?glucose-Vitamin C 4-0.006 GM CHEW chewable tablet Chew 1 tablet by mouth as needed for low blood sugar. 09/30/20   [provider]  ?losartan (COZAAR) 100 MG tablet Take 100 mg by mouth daily.    [provider]  ?Magnesium Oxide 420 MG TABS Take 420 mg by mouth daily. 12/19/20   [provider]  ?metFORMIN (GLUCOPHAGE) 1000 MG tablet Take 1,000-1,500 mg by mouth See admin instructions. Take 1000 mg by mouth in the morning and take 1500 mg in the evening    [provider]  ?methimazole (TAPAZOLE) 5 MG tablet Take 1 tablet (5 mg total) by mouth daily. Hyperthyroidism: E05.90 07/01/19   Renato Shin, MD  ?metoprolol tartrate (LOPRESSOR) 25 MG tablet Take 1 tablet (25 mg total) by mouth 2 (two) times daily. 03/06/19   Nahser, Wonda Cheng, MD  ?Multiple Vitamin (MULTIVITAMIN WITH MINERALS) TABS tablet Take 1 tablet by mouth daily. High potency    [provider]  ?Multiple Vitamins-Minerals (ZINC PO) Take 1 tablet by mouth daily.    [provider]  ?omeprazole (PRILOSEC) 20 MG capsule Take 20 mg by mouth daily.    [provider]  ?QUEtiapine (SEROQUEL) 50 MG tablet Take 50 mg by mouth at bedtime.    [provider]  ?risperiDONE (RISPERDAL) 1 MG tablet Take 1 mg by mouth at bedtime.    [provider]  ?Semaglutide,0.25 or 0.'5MG'$ /DOS, 2 MG/1.5ML SOPN Inject 0.5 mg into the skin every Monday. 05/03/21   [provider]  ?tamsulosin (FLOMAX) 0.4 MG CAPS capsule Take 0.8 mg by mouth at bedtime. 10/28/19   [provider]  ?vitamin B-12 (CYANOCOBALAMIN) 500 MCG tablet Take 500 mcg by mouth in the morning and at bedtime. 06/21/20   [provider]  ? ?Family History ?Family History  ?Problem Relation Age of Onset  ? Stroke  Father   ? Colon cancer Neg Hx   ? Thyroid disease Neg Hx   ? Colon polyps Neg Hx   ? ? ?Social History ?Social History  ? ?Tobacco Use  ? Smoking status: Former  ?  Packs/day: 0.00  ?  Years: 26.00  ?  Pack years: 0.00  ?  Types: Cigarettes  ? Smokeless tobacco: Never  ? Tobacco comments:  ?  Quit smoking in 1980s  ?Substance Use Topics  ? Alcohol use: No  ?  Comment: history of ETOH abuse in remote past, none currently.   ? Drug use: No  ? ? ? ?Allergies   ?Empagliflozin, Haldol [haloperidol], Lisinopril, and Sulfamethoxazole-trimethoprim ? ? ?Review of Systems ?Review of Systems ?Per HPI ? ?Physical Exam ?Triage Vital Signs ?ED Triage Vitals  ?Enc Vitals Group  ?   BP 08/19/21 1659 (!) 158/64  ?   Pulse Rate 08/19/21 1659 (!) 125  ?   Resp 08/19/21 1659  18  ?   Temp 08/19/21 1659 98.2 ?F (36.8 ?C)  ?   Temp Source 08/19/21 1659 Oral  ?   SpO2 08/19/21 1659 95 %  ?   Weight --   ?   Height --   ?   Head Circumference --   ?   Peak Flow --   ?   Pain Score 08/19/21 1701 0  ?   Pain Loc --   ?   Pain Edu? --   ?   Excl. in [place]? --   ? ?No data found. ? ?Updated Vital Signs ?BP (!) 146/72 (BP Location: Right Arm)   Pulse 99   Temp 98.2 ?F (36.8 ?C) (Oral)   Resp 18   SpO2 93%  ? ?Visual Acuity ?Right Eye Distance:   ?Left Eye Distance:   ?Bilateral Distance:   ? ?Right Eye Near:   ?Left Eye Near:    ?Bilateral Near:    ? ?Physical Exam ?Vitals and nursing note reviewed.  ?Constitutional:   ?   Appearance: Normal appearance.  ?HENT:  ?   Head: Atraumatic.  ?   Nose: Rhinorrhea present.  ?   Mouth/Throat:  ?   Mouth: Mucous membranes are moist.  ?Eyes:  ?   Extraocular Movements: Extraocular movements intact.  ?   Conjunctiva/sclera: Conjunctivae normal.  ?Cardiovascular:  ?   Rate and Rhythm: Tachycardia present. Rhythm irregular.  ?Pulmonary:  ?   Effort: Pulmonary effort is normal.  ?   Breath sounds: Normal breath sounds. No wheezing or rales.  ?Musculoskeletal:     ?   General: Normal range of motion.  ?    Cervical back: Normal range of motion and neck supple.  ?Skin: ?   General: Skin is warm and dry.  ?Neurological:  ?   General: No focal deficit present.  ?   Mental Status: He is oriented to person, place, and t

## 2021-08-21 NOTE — Progress Notes (Signed)
?  Echocardiogram ?2D Echocardiogram has been performed. ? ?Darrell Lang ?08/21/2021, 1:49 PM ?

## 2021-08-22 LAB — CULTURE, BLOOD (ROUTINE X 2): Special Requests: ADEQUATE

## 2021-08-22 LAB — CBC
HCT: 28.8 % — ABNORMAL LOW (ref 39.0–52.0)
Hemoglobin: 9 g/dL — ABNORMAL LOW (ref 13.0–17.0)
MCH: 25.7 pg — ABNORMAL LOW (ref 26.0–34.0)
MCHC: 31.3 g/dL (ref 30.0–36.0)
MCV: 82.3 fL (ref 80.0–100.0)
Platelets: 317 10*3/uL (ref 150–400)
RBC: 3.5 MIL/uL — ABNORMAL LOW (ref 4.22–5.81)
RDW: 17.1 % — ABNORMAL HIGH (ref 11.5–15.5)
WBC: 11.4 10*3/uL — ABNORMAL HIGH (ref 4.0–10.5)
nRBC: 0 % (ref 0.0–0.2)

## 2021-08-22 LAB — GLUCOSE, CAPILLARY
Glucose-Capillary: 130 mg/dL — ABNORMAL HIGH (ref 70–99)
Glucose-Capillary: 192 mg/dL — ABNORMAL HIGH (ref 70–99)
Glucose-Capillary: 214 mg/dL — ABNORMAL HIGH (ref 70–99)
Glucose-Capillary: 241 mg/dL — ABNORMAL HIGH (ref 70–99)

## 2021-08-22 LAB — BASIC METABOLIC PANEL
Anion gap: 7 (ref 5–15)
BUN: 13 mg/dL (ref 8–23)
CO2: 23 mmol/L (ref 22–32)
Calcium: 8.4 mg/dL — ABNORMAL LOW (ref 8.9–10.3)
Chloride: 109 mmol/L (ref 98–111)
Creatinine, Ser: 0.87 mg/dL (ref 0.61–1.24)
GFR, Estimated: >60 mL/min (ref >60)
Glucose, Bld: 141 mg/dL — ABNORMAL HIGH (ref 70–99)
Potassium: 3.7 mmol/L (ref 3.5–5.1)
Sodium: 139 mmol/L (ref 135–145)

## 2021-08-22 LAB — LEGIONELLA PNEUMOPHILA SEROGP 1 UR AG: L. pneumophila Serogp 1 Ur Ag: NEGATIVE

## 2021-08-22 LAB — MAGNESIUM: Magnesium: 2 mg/dL (ref 1.7–2.4)

## 2021-08-22 MED ORDER — CEFAZOLIN SODIUM-DEXTROSE 2-4 GM/100ML-% IV SOLN
2.0000 g | Freq: Three times a day (TID) | INTRAVENOUS | Status: DC
  Administered 2021-08-22 – 2021-08-23 (×3): 2 g via INTRAVENOUS
  Filled 2021-08-22 (×3): qty 100

## 2021-08-22 MED ORDER — METOPROLOL TARTRATE 50 MG PO TABS
50.0000 mg | ORAL_TABLET | Freq: Two times a day (BID) | ORAL | Status: DC
  Administered 2021-08-22 – 2021-08-23 (×2): 50 mg via ORAL
  Filled 2021-08-22 (×2): qty 1

## 2021-08-22 MED ORDER — DILTIAZEM HCL ER COATED BEADS 180 MG PO CP24
180.0000 mg | ORAL_CAPSULE | Freq: Every day | ORAL | Status: DC
  Administered 2021-08-22 – 2021-08-23 (×2): 180 mg via ORAL
  Filled 2021-08-22 (×2): qty 1

## 2021-08-22 MED ORDER — METOPROLOL TARTRATE 50 MG PO TABS
100.0000 mg | ORAL_TABLET | Freq: Two times a day (BID) | ORAL | Status: DC
  Administered 2021-08-22: 100 mg via ORAL
  Filled 2021-08-22: qty 2

## 2021-08-22 NOTE — TOC Initial Note (Signed)
Transition of Care (TOC) - Initial/Assessment Note  ? ? ?Patient Details  ?Name: Darrell Lang ?MRN: [id number] ?Date of Birth: 07/27/1945 ? ?Transition of Care (TOC) CM/SW Contact:    ?Boneta Lucks, RN ?Phone Number: ?08/22/2021, 11:03 AM ? ?Clinical Narrative:       Patient admitted with Sepsis. Patient is confused. Patient has a high risk for readmission. TOC spoke to [facility]. [facility] Patient lives at home, still drives himself, independent with ADL's. Has recently been discharged from home health. TOC to follow for any needs when patient is stable.           ? ? ?Expected Discharge Plan: [address] ?Barriers to Discharge: Continued Medical Work up ? ?Patient Goals and CMS Choice ?Patient states their goals for this hospitalization and ongoing recovery are:: to get better ?CMS Medicare.gov Compare Post Acute Care list provided to:: Patient Represenative (must comment) ?Choice offered to / list presented to : Spouse ? ?Expected Discharge Plan and Services ?Expected Discharge Plan: [place] ?   ?Living arrangements for the past 2 months: [place] ?      ?Prior Living Arrangements/Services ?Living arrangements for the past 2 months: [place] ?Lives with:: Spouse ?   ?Activities of Daily Living ?Home Assistive Devices/Equipment: CBG Meter, Eyeglasses, Dentures (specify type) ?ADL Screening (condition at time of admission) ?Patient's cognitive ability adequate to safely complete daily activities?: Yes ?Is the patient deaf or have difficulty hearing?: No ?Does the patient have difficulty seeing, even when wearing glasses/contacts?: No ?Does the patient have difficulty concentrating, remembering, or making decisions?: Yes ?Patient able to express need for assistance with ADLs?: Yes ?Does the patient have difficulty dressing or bathing?: Yes ?Independently performs ADLs?: Yes (appropriate for developmental age) ?Does the patient  have difficulty walking or climbing stairs?: Yes ?Weakness of Legs: None ?Weakness of Arms/Hands: None ? ?Permission Sought/Granted ?  ?Emotional Assessment ?  ?Attitude/Demeanor/Rapport: Other (comment) (confused) ?  ?Orientation: : Oriented to Self ?Alcohol / Substance Use: Not Applicable ?Psych Involvement: No (comment) ? ?Admission diagnosis:  Acute cystitis without hematuria [N30.00] ?Sepsis ([place]) [A41.9] ?Patient Active Problem List  ? Diagnosis Date Noted  ? Sepsis ([place]) 08/19/2021  ? Sepsis secondary to UTI ([place]) 08/19/2021  ? Acute lower respiratory infection 08/19/2021  ? Atrial fibrillation, chronic ([place]) 08/19/2021  ? Normocytic anemia 06/23/2021  ? Heme positive stool 06/23/2021  ? Hyperthyroidism 03/11/2019  ? COVID-19 virus infection 02/09/2019  ? Essential hypertension 02/09/2019  ? Hyperlipidemia 02/09/2019  ? Diabetes ([place]) 02/09/2019  ? Atrial fibrillation with RVR ([place]) 02/08/2019  ? Encounter for screening colonoscopy 07/28/2013  ? ?PCP:  [place]:   ?[id number] [place] [place] [address]?Phone: [phone]: [phone]? ?Readmission Risk Interventions ? ?  08/22/2021  ? 11:00 AM  ?Readmission Risk Prevention Plan  ?Transportation Screening Complete  ?PCP or Specialist Appt within 3-5 Days Not Complete  ?[facility] Consult Complete  ?Social Work Consult for [place]/Counseling Complete  ?Palliative Care Screening Not Applicable  ?Medication Review (Press photographer Complete  ? ? ? ?

## 2021-08-22 NOTE — Plan of Care (Signed)

## 2021-08-22 NOTE — Progress Notes (Signed)
?PROGRESS NOTE ? ? ? ?Darrell Lang  PJA:[id number] DOB: 1945/12/24 DOA: 08/19/2021 ?[facility]P: [facility] ? ? ?Brief Narrative:  ?Per HPI: ?Darrell Lang is a 76 y.o. male with medical history significant of atrial fibrillation, hypertension, diabetes, anxiety depression on multiple antipsychotics.  Patient seen for productive cough with green sputum, fevers, chills that started yesterday.  Patient denies shortness of breath or chest pain.  He did go to urgent care today to be evaluated, however he was sent to the emergency department due to his heart rate being in rapid ventricular response.  Here, the patient received 2 L of fluid bolus and his heart rate decreased into normal range.  He had a chest x-ray which did not show an acute infection.  He received Rocephin and azithromycin.  His UA does show evidence of infection and his white count is 20.  Lactic acid was slightly elevated initially, but improved on repeat.  He denies dysuria.  He does have some mild frequency. ? ?-Patient was admitted with sepsis, present on admission secondary to UTI as well as concern for acute lower respiratory infection.  He remains on Rocephin and azithromycin.  Gram-negative rods with Klebsiella pneumonia noted in 1 out of 4 bottles.  He has gone into A-fib with RVR requiring Cardizem drip on 4/8, but this now appears resolved and he has transitioned to oral Cardizem.  He may transfer to telemetry unit if he continues to remain stable today.  2D echocardiogram ordered and pending.  TSH within normal limits  ? ? ?Assessment & Plan: ?  ?Principal Problem: ?  Sepsis ([place]) ?Active Problems: ?  Essential hypertension ?  Diabetes ([place]) ?  Hyperthyroidism ?  Sepsis secondary to UTI [facility]) ?  Acute lower respiratory infection ?  Atrial fibrillation, chronic ([place]) ? ?Assessment and Plan: ?Sepsis ([facility]) due to UTI and Klebsiella bacteremia: WBC improving, still elevated. Klebsiella growing in aerobic bottles of 2 of 2 collections. This  is precipitating rate control issues with AFib still.  ?- Narrow abx to ancef based on susceptibility report, d/w pharmacy. Plan 7 days total antibiotics if continues to be afebrile and leukocytosis resolves.  ?- s/p IVF, lactic acid cleared.  ? ?Atrial fibrillation with RVR: Recurrent RVR.  ?- Consolidate diltiazem to '180mg'$  q24h (LVEF 60-65% ?- Increased metoprolol to '100mg'$  planning BID dosing, will lower dose back given improved rate control with infection treatment, borderline bradycardia. Tele personally reviewed, did show 2 second ventricular pause, asymptomatic.  ?- Continue eliquis ? ?Acute bronchitis: CXR negative. ?- Imaging suggestive of interstitial coarsening. Consider HRCT after discharge. ?- Monitor sputum culture (NGTD). Abx as above.  ? ?NIDT2DM: HbA1c 7.2%. ?- Continue basal-bolus insulin.  ?- Return to home regimen at discharge ? ?Hypertension ?- Continue antihypertensives ? ?Hyperthyroidism: TSH 1.6 ?- Continue methimazole  ? ?Hypomagnesemia: Resolved with supplementation. ? ?Iron deficiency anemia: Complicated by acute infection. Hgb stabilizing at 9g/dl ?- Recheck CBC in AM. No bleeding, so can continue eliquis.  ?- Start iron supplementation once outside scope of bacteremia.  ? ?DVT prophylaxis: Apixaban ?Code Status: Full ?Family Communication: None at bedside ?Disposition Plan:  ?Status is: Inpatient ?Remains inpatient appropriate because: Continues to require IV medications for treatment. ? ?Subjective: ?Feels better, denying shortness of breath or pain anywhere at this time. Wandered into lobby expecting to be discharged today. Overnight had rapid irregular heart rate  ? ?Objective: ?Vitals:  ? 08/21/21 2042 08/22/21 0521 08/22/21 0522 08/22/21 1509  ?BP: 134/78 (!) 151/73 (!) 151/73 117/68  ?  Pulse: (!) 106 99 (!) 103 80  ?Resp: '18 20 20 20  '$ ?Temp: 98 ?F (36.7 ?C) 98.2 ?F (36.8 ?C) 98.2 ?F (36.8 ?C) 98.7 ?F (37.1 ?C)  ?TempSrc: Oral Oral Oral   ?SpO2: (!) 89% 91% 91% 94%  ?Weight:       ?Height:      ? ? ?Intake/Output Summary (Last 24 hours) at 08/22/2021 1616 ?Last data filed at 08/22/2021 1500 ?Gross per 24 hour  ?Intake 690 ml  ?Output --  ?Net 690 ml  ? ?Filed Weights  ? 08/20/21 1736 08/21/21 0500 08/21/21 1537  ?Weight: 86.4 kg 88 kg 89.2 kg  ? ? ?Examination: ?Gen: 76 y.o. male in no distress ?Pulm: Nonlabored breathing room air. Clear. ?CV: Irreg w/bradycardic rate in 50-60's without murmur, rub, or gallop. No JVD, no pitting dependent edema. ?GI: Abdomen soft, non-tender, non-distended, with normoactive bowel sounds.  ?Ext: Warm, no deformities ?Skin: No rashes, lesions or ulcers on visualized skin. ?Neuro: Alert and oriented. No focal neurological deficits. ?Psych: Judgement and insight appear fair. Mood euthymic & affect congruent. Behavior is appropriate.   ? ?Data Reviewed: I have personally reviewed following labs and imaging studies ? ?CBC: ?Recent Labs  ?Lab 08/19/21 ?1809 08/20/21 ?0410 08/21/21 ?4481 08/22/21 ?8563  ?WBC 20.9* 17.3* 12.9* 11.4*  ?NEUTROABS 18.3*  --   --   --   ?HGB 10.9* 9.1* 8.4* 9.0*  ?HCT 34.7* 28.7* 27.6* 28.8*  ?MCV 81.5 82.0 82.1 82.3  ?PLT 353 281 270 317  ? ?Basic Metabolic Panel: ?Recent Labs  ?Lab 08/19/21 ?1809 08/20/21 ?0410 08/21/21 ?1497 08/22/21 ?0263  ?NA 134* 137 136 139  ?K 3.8 3.6 3.7 3.7  ?CL 100 105 105 109  ?CO2 22 22 20* 23  ?GLUCOSE 314* 156* 209* 141*  ?BUN '19 16 15 13  '$ ?CREATININE 1.28* 0.95 0.94 0.87  ?CALCIUM 9.0 8.3* 8.0* 8.4*  ?MG  --   --  1.5* 2.0  ? ?GFR: ?Estimated Creatinine Clearance: 80.5 mL/min (by C-G formula based on SCr of 0.87 mg/dL). ?Liver Function Tests: ?Recent Labs  ?Lab 08/19/21 ?1809  ?AST 34  ?ALT 24  ?ALKPHOS 117  ?BILITOT 0.6  ?PROT 8.0  ?ALBUMIN 3.6  ? ?HbA1C: ?Recent Labs  ?  08/19/21 ?1809  ?HGBA1C 7.2*  ? ?Thyroid Function Tests: ?Recent Labs  ?  08/20/21 ?1714  ?TSH 1.656  ? ?Anemia Panel: ?Recent Labs  ?  08/21/21 ?7858 08/21/21 ?8502  ?VITAMINB12  --  330  ?FOLATE  --  17.9  ?FERRITIN  --  142  ?TIBC  --   259  ?IRON  --  9*  ?RETICCTPCT 1.1  --   ? ?Sepsis Labs: ?Recent Labs  ?Lab 08/19/21 ?1826 08/19/21 ?1946  ?LATICACIDVEN 2.3* 1.6  ? ? ?Recent Results (from the past 240 hour(s))  ?Resp Panel by RT-PCR (Flu A&B, Covid) Nasopharyngeal Swab     Status: None  ? Collection Time: 08/19/21  6:09 PM  ? Specimen: Nasopharyngeal Swab; Nasopharyngeal(NP) swabs in vial transport medium  ?Result Value Ref Range Status  ? SARS Coronavirus 2 by RT PCR NEGATIVE NEGATIVE Final  ?  Comment: (NOTE) ?SARS-CoV-2 target nucleic acids are NOT DETECTED. ? ?The SARS-CoV-2 RNA is generally detectable in upper respiratory ?specimens during the acute phase of infection. The lowest ?concentration of SARS-CoV-2 viral copies this assay can detect is ?138 copies/mL. A negative result does not preclude SARS-Cov-2 ?infection and should not be used as the sole basis for treatment or ?other patient management decisions.  A negative result may occur with  ?improper specimen collection/handling, submission of specimen other ?than nasopharyngeal swab, presence of viral mutation(s) within the ?areas targeted by this assay, and inadequate number of viral ?copies(<138 copies/mL). A negative result must be combined with ?clinical observations, patient history, and epidemiological ?information. The expected result is Negative. ? ?Fact Sheet for Patients:  ?EntrepreneurPulse.com.au ? ?Fact Sheet for Healthcare Providers:  ?IncredibleEmployment.be ? ?This test is no t yet approved or cleared by the [place] FDA and  ?has been authorized for detection and/or diagnosis of SARS-CoV-2 by ?FDA under an Emergency Use Authorization (EUA). This EUA will remain  ?in effect (meaning this test can be used) for the duration of the ?COVID-19 declaration under Section 564(b)(1) of the Act, 21 ?U.S.C.section 360bbb-3(b)(1), unless the authorization is terminated  ?or revoked sooner.  ? ? ?  ? Influenza A by PCR NEGATIVE NEGATIVE Final   ? Influenza B by PCR NEGATIVE NEGATIVE Final  ?  Comment: (NOTE) ?The Xpert Xpress SARS-CoV-2/FLU/RSV plus assay is intended as an aid ?in the diagnosis of influenza from Nasopharyngeal swab specimens an

## 2021-08-22 NOTE — Progress Notes (Signed)
Patients wife at bedside requested to bladder scan patient, bladder scan revealed 440cc in urinary bladder, obtained order for in and out catheter , will hold off for now, went in and patient was up to restroom voiding, ?

## 2021-08-23 ENCOUNTER — Inpatient Hospital Stay (HOSPITAL_COMMUNITY): Payer: No Typology Code available for payment source

## 2021-08-23 DIAGNOSIS — R6520 Severe sepsis without septic shock: Secondary | ICD-10-CM

## 2021-08-23 DIAGNOSIS — A419 Sepsis, unspecified organism: Secondary | ICD-10-CM

## 2021-08-23 DIAGNOSIS — N179 Acute kidney failure, unspecified: Secondary | ICD-10-CM

## 2021-08-23 LAB — BASIC METABOLIC PANEL
Anion gap: 9 (ref 5–15)
BUN: 11 mg/dL (ref 8–23)
CO2: 23 mmol/L (ref 22–32)
Calcium: 8.4 mg/dL — ABNORMAL LOW (ref 8.9–10.3)
Chloride: 108 mmol/L (ref 98–111)
Creatinine, Ser: 0.86 mg/dL (ref 0.61–1.24)
GFR, Estimated: >60 mL/min (ref >60)
Glucose, Bld: 158 mg/dL — ABNORMAL HIGH (ref 70–99)
Potassium: 3.6 mmol/L (ref 3.5–5.1)
Sodium: 140 mmol/L (ref 135–145)

## 2021-08-23 LAB — GLUCOSE, CAPILLARY
Glucose-Capillary: 172 mg/dL — ABNORMAL HIGH (ref 70–99)
Glucose-Capillary: 235 mg/dL — ABNORMAL HIGH (ref 70–99)

## 2021-08-23 LAB — CBC
HCT: 27.9 % — ABNORMAL LOW (ref 39.0–52.0)
Hemoglobin: 8.5 g/dL — ABNORMAL LOW (ref 13.0–17.0)
MCH: 25.2 pg — ABNORMAL LOW (ref 26.0–34.0)
MCHC: 30.5 g/dL (ref 30.0–36.0)
MCV: 82.8 fL (ref 80.0–100.0)
Platelets: 348 10*3/uL (ref 150–400)
RBC: 3.37 MIL/uL — ABNORMAL LOW (ref 4.22–5.81)
RDW: 17.2 % — ABNORMAL HIGH (ref 11.5–15.5)
WBC: 11 10*3/uL — ABNORMAL HIGH (ref 4.0–10.5)
nRBC: 0 % (ref 0.0–0.2)

## 2021-08-23 MED ORDER — CEFADROXIL 1 G PO TABS
1.0000 g | ORAL_TABLET | Freq: Two times a day (BID) | ORAL | 0 refills | Status: AC
Start: 2021-08-23 — End: 2021-09-09

## 2021-08-23 MED ORDER — DILTIAZEM HCL ER COATED BEADS 180 MG PO CP24: 180.0000 mg | ORAL_CAPSULE | Freq: Every day | ORAL | 0 refills | Status: DC

## 2021-08-23 MED ORDER — IOHEXOL 300 MG/ML  SOLN
100.0000 mL | Freq: Once | INTRAMUSCULAR | Status: AC | PRN
  Administered 2021-08-23: 100 mL via INTRAVENOUS

## 2021-08-23 MED ORDER — IOHEXOL 9 MG/ML PO SOLN
ORAL | Status: AC
  Filled 2021-08-23: qty 1000

## 2021-08-23 MED ORDER — METOPROLOL TARTRATE 50 MG PO TABS: 50.0000 mg | ORAL_TABLET | Freq: Two times a day (BID) | ORAL | 0 refills | Status: DC

## 2021-08-23 NOTE — Discharge Summary (Signed)
?Physician Discharge Summary ?  ?Patient: Darrell Lang MRN: [id number] DOB: 1945/08/06  ?Admit date:     08/19/2021  ?Discharge date: 08/23/21  ?Discharge Physician: Patrecia Pour  ? ?PCP: [facility]  ? ?Recommendations at discharge:  ?Continue clinic follow up for right-sided pyelonephritis with Klebsiella bacteremia. Based on discussions with ID and urology, a 21 day course of antibiotics is recommended. ?Follow up with PCP and/or cardiology for AFib. Increased metoprolol and started diltiazem based on HR trends.  ?Consider initiation of iron supplementation after outside the scope of bacteremia. ?Consider HRCT chest to characterize interstitial abnormality on imaging seen here (see below) ? ?Discharge Diagnoses: ?Principal Problem: ?  Sepsis ([place]) ?Active Problems: ?  Essential hypertension ?  Diabetes ([place]) ?  Hyperthyroidism ?  Sepsis secondary to UTI [facility]) ?  Acute lower respiratory infection ?  Atrial fibrillation, chronic ([place]) ? ? ?Hospital Course: ?Per HPI: ?Darrell Lang is a 76 y.o. male with medical history significant of atrial fibrillation, hypertension, diabetes, anxiety depression on multiple antipsychotics.  Patient seen for productive cough with green sputum, fevers, chills that started yesterday.  Patient denies shortness of breath or chest pain.  He did go to urgent care today to be evaluated, however he was sent to the emergency department due to his heart rate being in rapid ventricular response.  Here, the patient received 2 L of fluid bolus and his heart rate decreased into normal range.  He had a chest x-ray which did not show an acute infection.  He received Rocephin and azithromycin.  His UA does show evidence of infection and his white count is 20.  Lactic acid was slightly elevated initially, but improved on repeat.  He denies dysuria.  He does have some mild frequency. ? ?-Patient was admitted with sepsis, present on admission secondary to UTI as well as concern for acute  lower respiratory infection.  He remains on Rocephin and azithromycin.  Gram-negative rods with Klebsiella pneumonia noted in 1 out of 4 bottles.  He has gone into A-fib with RVR requiring Cardizem drip on 4/8, but this now appears resolved and he has transitioned to oral Cardizem.  He may transfer to telemetry unit if he continues to remain stable today.  2D echocardiogram ordered and pending.  TSH within normal limits ? ?Assessment and Plan: ?Sepsis (POA) due to acute right pyelonephritis and Klebsiella bacteremia: WBC improving, defervesced. Due to insignificant growth on urine culture, CT was performed after consultation with ID. This reveals significant pyelo with suggestion of the beginning of a small abscess. Urology, Dr. Tresa Moore, reviewed the scan, confirming there is no drainable collection and at this size, with susceptibility-guided antibiotics, this should heal with medical therapy alone. He did recommend protracted duration of antibiotics, prescribed 21 total days.  ?  ?Atrial fibrillation with RVR: Recurrent RVR.  ?- Consolidated new start diltiazem to '180mg'$  q24h (LVEF 60-65%) ?- Increased metoprolol to '50mg'$  ?- Continue eliquis ?  ?Acute bronchitis: CXR negative. ?- Imaging suggestive of interstitial coarsening. Consider HRCT after discharge. ?- Monitor sputum culture (NGTD). Abx as above.  ?  ?NIDT2DM: HbA1c 7.2%. ?- Return to home regimen at discharge ?  ?Hypertension ?- Continue antihypertensives ?  ?Hyperthyroidism: TSH 1.6 ?- Continue methimazole  ?  ?Hypomagnesemia: Resolved with supplementation. ?  ?Iron deficiency anemia: Complicated by acute infection. Hgb stabilizing at 9g/dl ?- Recheck CBC in AM. No bleeding, so can continue eliquis.  ?- Start iron supplementation once outside scope of bacteremia. ?  ?Consultants:  ID, urology ?Procedures performed: None  ?Disposition: Home ?Diet recommendation:  ?Cardiac and Carb modified diet ?DISCHARGE MEDICATION: ?Allergies as of 08/23/2021   ? ?   Reactions   ? Empagliflozin   ? Other reaction(s): Urinary tract infectious disease  ? Haldol [haloperidol]   ? "Messed up his thoughts" pt takes risperidone   ? Lisinopril Cough  ? Sulfamethoxazole-trimethoprim   ? Unknown reaction  ? ?  ? ?  ?Medication List  ?  ? ?TAKE these medications   ? ?amLODipine 5 MG tablet ?Commonly known as: NORVASC ?Take 1 tablet (5 mg total) by mouth daily. ?  ?apixaban 5 MG Tabs tablet ?Commonly known as: ELIQUIS ?Take 1 tablet (5 mg total) by mouth 2 (two) times daily. ?  ?atorvastatin 10 MG tablet ?Commonly known as: LIPITOR ?Take 10 mg by mouth daily. ?  ?benztropine 1 MG tablet ?Commonly known as: COGENTIN ?Take 1 mg by mouth at bedtime. ?  ?busPIRone 5 MG tablet ?Commonly known as: BUSPAR ?Take 2.5 mg by mouth daily. ?  ?Carboxymethylcellulose Sodium 1 % Gel ?Place 1 drop into both eyes as needed (eye glued together). ?  ?carboxymethylcellulose 0.5 % Soln ?Commonly known as: REFRESH PLUS ?Place 1 drop into both eyes 2 (two) times daily as needed (dry eye). ?  ?cefadroxil 1 g tablet ?Commonly known as: DURICEF ?Take 1 tablet (1 g total) by mouth 2 (two) times daily for 17 days. ?  ?Cholecalciferol 50 MCG (2000 UT) Tabs ?Take 2,000 Units by mouth 3 (three) times a week. ?  ?citalopram 20 MG tablet ?Commonly known as: CELEXA ?Take 20 mg by mouth daily. ?  ?diltiazem 180 MG 24 hr capsule ?Commonly known as: CARDIZEM CD ?Take 1 capsule (180 mg total) by mouth daily. ?Start taking on: August 24, 2021 ?  ?finasteride 5 MG tablet ?Commonly known as: PROSCAR ?Take 5 mg by mouth daily. ?  ?fluticasone 50 MCG/ACT nasal spray ?Commonly known as: FLONASE ?Place 2 sprays into both nostrils daily. ?  ?gabapentin 300 MG capsule ?Commonly known as: NEURONTIN ?Take 300 mg by mouth 2 (two) times daily. ?  ?glipiZIDE 5 MG tablet ?Commonly known as: GLUCOTROL ?Take 2.5-5 mg by mouth See admin instructions. Take 5 mg in the morning and 2.5 mg every evening ?  ?glucose-Vitamin C 4-0.006 GM Chew chewable  tablet ?Chew 1 tablet by mouth as needed for low blood sugar. ?  ?losartan 100 MG tablet ?Commonly known as: COZAAR ?Take 100 mg by mouth daily. ?  ?Magnesium Oxide 420 MG Tabs ?Take 420 mg by mouth daily. ?  ?metFORMIN 1000 MG tablet ?Commonly known as: GLUCOPHAGE ?Take 1,000-1,500 mg by mouth See admin instructions. Take 1000 mg by mouth in the morning and take 1500 mg in the evening ?  ?methimazole 5 MG tablet ?Commonly known as: TAPAZOLE ?Take 1 tablet (5 mg total) by mouth daily. Hyperthyroidism: E05.90 ?  ?metoprolol tartrate 25 MG tablet ?Commonly known as: LOPRESSOR ?Take 1 tablet (25 mg total) by mouth 2 (two) times daily. ?What changed: Another medication with the same name was added. Make sure you understand how and when to take each. ?  ?metoprolol tartrate 50 MG tablet ?Commonly known as: LOPRESSOR ?Take 1 tablet (50 mg total) by mouth 2 (two) times daily. ?What changed: You were already taking a medication with the same name, and this prescription was added. Make sure you understand how and when to take each. ?  ?multivitamin with minerals Tabs tablet ?Take 1 tablet by mouth daily. High  potency ?  ?omeprazole 20 MG capsule ?Commonly known as: PRILOSEC ?Take 20 mg by mouth daily. ?  ?QUEtiapine 50 MG tablet ?Commonly known as: SEROQUEL ?Take 50 mg by mouth at bedtime. ?  ?risperiDONE 1 MG tablet ?Commonly known as: RISPERDAL ?Take 1 mg by mouth at bedtime. ?  ?Semaglutide(0.25 or 0.'5MG'$ /DOS) 2 MG/1.5ML Sopn ?Inject 0.5 mg into the skin every Monday. ?  ?tamsulosin 0.4 MG Caps capsule ?Commonly known as: FLOMAX ?Take 0.8 mg by mouth at bedtime. ?  ?vitamin B-12 500 MCG tablet ?Commonly known as: CYANOCOBALAMIN ?Take 500 mcg by mouth in the morning and at bedtime. ?  ?ZINC PO ?Take 1 tablet by mouth daily. ?  ? ?  ? ? Follow-up Information   ? ? [place] Follow up in 1 month(s).   ?Specialty: General Practice ?Contact information: ?[address] ?[place] [address] ?[phone] ? ? ?  ?   ? ?  ?  ? ?  ? ?Subjective: Feels well, much better. No urinary pain/urgency or frequency. Output is normal. No fever or chills. Wants to go home. Has been walking around unassisted. Was recently discharged fro

## 2021-08-23 NOTE — Plan of Care (Signed)
Kleb pneumo bacteremia 2/2 unclear source ?-urine Cx with insignificant growth ?-consider imaging abdomen ?

## 2021-08-24 LAB — CULTURE, RESPIRATORY W GRAM STAIN
Culture: NORMAL
Gram Stain: NONE SEEN

## 2021-08-25 LAB — CULTURE, BLOOD (ROUTINE X 2): Special Requests: ADEQUATE

## 2021-09-24 ENCOUNTER — Inpatient Hospital Stay (HOSPITAL_COMMUNITY)
Admission: EM | Admit: 2021-09-24 | Discharge: 2021-09-27 | DRG: 291 | Disposition: A | Discharge status: Home Health | Payer: No Typology Code available for payment source | Attending: Internal Medicine | Admitting: Internal Medicine

## 2021-09-24 ENCOUNTER — Encounter (HOSPITAL_COMMUNITY): Payer: Self-pay | Admitting: Emergency Medicine

## 2021-09-24 ENCOUNTER — Emergency Department (HOSPITAL_COMMUNITY): Payer: No Typology Code available for payment source

## 2021-09-24 ENCOUNTER — Other Ambulatory Visit: Payer: Self-pay

## 2021-09-24 DIAGNOSIS — F32A Depression, unspecified: Secondary | ICD-10-CM | POA: Diagnosis present

## 2021-09-24 DIAGNOSIS — R21 Rash and other nonspecific skin eruption: Secondary | ICD-10-CM | POA: Diagnosis present

## 2021-09-24 DIAGNOSIS — D649 Anemia, unspecified: Secondary | ICD-10-CM | POA: Diagnosis present

## 2021-09-24 DIAGNOSIS — E119 Type 2 diabetes mellitus without complications: Secondary | ICD-10-CM

## 2021-09-24 DIAGNOSIS — Z8616 Personal history of COVID-19: Secondary | ICD-10-CM

## 2021-09-24 DIAGNOSIS — E1165 Type 2 diabetes mellitus with hyperglycemia: Secondary | ICD-10-CM

## 2021-09-24 DIAGNOSIS — N401 Enlarged prostate with lower urinary tract symptoms: Secondary | ICD-10-CM

## 2021-09-24 DIAGNOSIS — I509 Heart failure, unspecified: Secondary | ICD-10-CM

## 2021-09-24 DIAGNOSIS — I10 Essential (primary) hypertension: Secondary | ICD-10-CM | POA: Diagnosis present

## 2021-09-24 DIAGNOSIS — E876 Hypokalemia: Secondary | ICD-10-CM | POA: Diagnosis present

## 2021-09-24 DIAGNOSIS — Z79899 Other long term (current) drug therapy: Secondary | ICD-10-CM

## 2021-09-24 DIAGNOSIS — Z823 Family history of stroke: Secondary | ICD-10-CM

## 2021-09-24 DIAGNOSIS — Z7901 Long term (current) use of anticoagulants: Secondary | ICD-10-CM

## 2021-09-24 DIAGNOSIS — E871 Hypo-osmolality and hyponatremia: Secondary | ICD-10-CM

## 2021-09-24 DIAGNOSIS — E785 Hyperlipidemia, unspecified: Secondary | ICD-10-CM | POA: Diagnosis present

## 2021-09-24 DIAGNOSIS — N138 Other obstructive and reflux uropathy: Secondary | ICD-10-CM

## 2021-09-24 DIAGNOSIS — E781 Pure hyperglyceridemia: Secondary | ICD-10-CM | POA: Diagnosis present

## 2021-09-24 DIAGNOSIS — Z882 Allergy status to sulfonamides status: Secondary | ICD-10-CM

## 2021-09-24 DIAGNOSIS — Z87891 Personal history of nicotine dependence: Secondary | ICD-10-CM

## 2021-09-24 DIAGNOSIS — Z20822 Contact with and (suspected) exposure to covid-19: Secondary | ICD-10-CM | POA: Diagnosis present

## 2021-09-24 DIAGNOSIS — J9601 Acute respiratory failure with hypoxia: Secondary | ICD-10-CM | POA: Diagnosis not present

## 2021-09-24 DIAGNOSIS — E0590 Thyrotoxicosis, unspecified without thyrotoxic crisis or storm: Secondary | ICD-10-CM | POA: Diagnosis present

## 2021-09-24 DIAGNOSIS — I482 Chronic atrial fibrillation, unspecified: Secondary | ICD-10-CM | POA: Diagnosis not present

## 2021-09-24 DIAGNOSIS — Z7984 Long term (current) use of oral hypoglycemic drugs: Secondary | ICD-10-CM

## 2021-09-24 DIAGNOSIS — I5031 Acute diastolic (congestive) heart failure: Secondary | ICD-10-CM | POA: Diagnosis present

## 2021-09-24 DIAGNOSIS — R0602 Shortness of breath: Secondary | ICD-10-CM | POA: Diagnosis not present

## 2021-09-24 DIAGNOSIS — F419 Anxiety disorder, unspecified: Secondary | ICD-10-CM | POA: Diagnosis present

## 2021-09-24 DIAGNOSIS — Z888 Allergy status to other drugs, medicaments and biological substances status: Secondary | ICD-10-CM

## 2021-09-24 DIAGNOSIS — I110 Hypertensive heart disease with heart failure: Principal | ICD-10-CM | POA: Diagnosis present

## 2021-09-24 DIAGNOSIS — N320 Bladder-neck obstruction: Secondary | ICD-10-CM | POA: Diagnosis present

## 2021-09-24 LAB — COMPREHENSIVE METABOLIC PANEL
ALT: 12 U/L (ref 0–44)
AST: 17 U/L (ref 15–41)
Albumin: 4 g/dL (ref 3.5–5.0)
Alkaline Phosphatase: 112 U/L (ref 38–126)
Anion gap: 8 (ref 5–15)
BUN: 12 mg/dL (ref 8–23)
CO2: 24 mmol/L (ref 22–32)
Calcium: 8.7 mg/dL — ABNORMAL LOW (ref 8.9–10.3)
Chloride: 93 mmol/L — ABNORMAL LOW (ref 98–111)
Creatinine, Ser: 0.75 mg/dL (ref 0.61–1.24)
GFR, Estimated: >60 mL/min (ref >60)
Glucose, Bld: 154 mg/dL — ABNORMAL HIGH (ref 70–99)
Potassium: 4.2 mmol/L (ref 3.5–5.1)
Sodium: 125 mmol/L — ABNORMAL LOW (ref 135–145)
Total Bilirubin: 0.7 mg/dL (ref 0.3–1.2)
Total Protein: 7.3 g/dL (ref 6.5–8.1)

## 2021-09-24 LAB — BASIC METABOLIC PANEL
Anion gap: 9 (ref 5–15)
BUN: 13 mg/dL (ref 8–23)
CO2: 24 mmol/L (ref 22–32)
Calcium: 8.7 mg/dL — ABNORMAL LOW (ref 8.9–10.3)
Chloride: 93 mmol/L — ABNORMAL LOW (ref 98–111)
Creatinine, Ser: 0.78 mg/dL (ref 0.61–1.24)
GFR, Estimated: >60 mL/min (ref >60)
Glucose, Bld: 202 mg/dL — ABNORMAL HIGH (ref 70–99)
Potassium: 4.4 mmol/L (ref 3.5–5.1)
Sodium: 126 mmol/L — ABNORMAL LOW (ref 135–145)

## 2021-09-24 LAB — CBC
HCT: 31.4 % — ABNORMAL LOW (ref 39.0–52.0)
Hemoglobin: 9.9 g/dL — ABNORMAL LOW (ref 13.0–17.0)
MCH: 26 pg (ref 26.0–34.0)
MCHC: 31.5 g/dL (ref 30.0–36.0)
MCV: 82.4 fL (ref 80.0–100.0)
Platelets: 233 10*3/uL (ref 150–400)
RBC: 3.81 MIL/uL — ABNORMAL LOW (ref 4.22–5.81)
RDW: 17 % — ABNORMAL HIGH (ref 11.5–15.5)
WBC: 12.2 10*3/uL — ABNORMAL HIGH (ref 4.0–10.5)
nRBC: 0 % (ref 0.0–0.2)

## 2021-09-24 LAB — BRAIN NATRIURETIC PEPTIDE: B Natriuretic Peptide: 315 pg/mL — ABNORMAL HIGH (ref 0.0–100.0)

## 2021-09-24 LAB — RESP PANEL BY RT-PCR (FLU A&B, COVID) ARPGX2
Influenza A by PCR: NEGATIVE
Influenza B by PCR: NEGATIVE
SARS Coronavirus 2 by RT PCR: NEGATIVE

## 2021-09-24 LAB — TROPONIN I (HIGH SENSITIVITY): Troponin I (High Sensitivity): 5 ng/L (ref <18)

## 2021-09-24 LAB — MAGNESIUM: Magnesium: 1.8 mg/dL (ref 1.7–2.4)

## 2021-09-24 MED ORDER — FUROSEMIDE 10 MG/ML IJ SOLN
40.0000 mg | Freq: Once | INTRAMUSCULAR | Status: AC
  Administered 2021-09-24: 40 mg via INTRAVENOUS
  Filled 2021-09-24: qty 4

## 2021-09-24 MED ORDER — SODIUM CHLORIDE 0.9 % IV SOLN: 250.0000 mL | INTRAVENOUS | Status: DC | PRN

## 2021-09-24 MED ORDER — FUROSEMIDE 10 MG/ML IJ SOLN
40.0000 mg | Freq: Two times a day (BID) | INTRAMUSCULAR | Status: DC
  Administered 2021-09-24 – 2021-09-26 (×5): 40 mg via INTRAVENOUS
  Filled 2021-09-24 (×6): qty 4

## 2021-09-24 MED ORDER — SODIUM CHLORIDE 0.9% FLUSH
3.0000 mL | INTRAVENOUS | Status: DC | PRN
Start: 2021-09-24 — End: 2021-09-27

## 2021-09-24 MED ORDER — NITROGLYCERIN 2 % TD OINT
1.0000 [in_us] | TOPICAL_OINTMENT | Freq: Four times a day (QID) | TRANSDERMAL | Status: DC
  Administered 2021-09-24 – 2021-09-25 (×5): 1 [in_us] via TOPICAL
  Filled 2021-09-24 (×6): qty 1

## 2021-09-24 MED ORDER — SODIUM CHLORIDE 0.9% FLUSH
3.0000 mL | Freq: Two times a day (BID) | INTRAVENOUS | Status: DC
  Administered 2021-09-24 – 2021-09-27 (×6): 3 mL via INTRAVENOUS

## 2021-09-24 NOTE — ED Notes (Signed)
EKG done, given to EDP. Patient placed on 2 liters O2 via [place]. Room being cleaned.  ?

## 2021-09-24 NOTE — ED Provider Notes (Signed)
?[facility] ?Provider Note ? ? ?CSN: [id number] ?Arrival date & time: 09/24/21  1110 ? ?  ? ?History ? ?Chief Complaint  ?Patient presents with  ? Shortness of Breath  ? ? ?Darrell Lang is a 76 y.o. male. ? ? ?Shortness of Breath ?Associated symptoms: no fever   ? ?Patient has history of atrial fibrillation with rapid rate, hypertension, hyperlipidemia, diabetes, hypothyroidism, sepsis who presents to the ED for evaluation of shortness of breath.  States he was in the hospital in April of this year and was discharged on April 11.  Patient was treated for sepsis.  He was also noted to have diabetes hypertension and chronic A-fib.  Patient states he has been having trouble with shortness of breath for the last week.  He went to his doctor at the [place] [facility] who thought it could be related to fluid buildup.  He was put on diuretic medications but does not feel he is getting any better.  Patient is having worsening shortness of breath.  It increases with activity and lying flat. ? ?Home Medications ?Prior to Admission medications   ?Medication Sig Start Date End Date Taking? Authorizing Provider  ?amLODipine (NORVASC) 5 MG tablet Take 1 tablet (5 mg total) by mouth daily. 08/12/19   Richardson Dopp T, PA-C  ?apixaban (ELIQUIS) 5 MG TABS tablet Take 1 tablet (5 mg total) by mouth 2 (two) times daily. 02/24/19   Nahser, Wonda Cheng, MD  ?atorvastatin (LIPITOR) 10 MG tablet Take 10 mg by mouth daily. 06/21/20   [provider]  ?benztropine (COGENTIN) 1 MG tablet Take 1 mg by mouth at bedtime.    [provider]  ?busPIRone (BUSPAR) 5 MG tablet Take 2.5 mg by mouth daily. 09/21/20   [provider]  ?carboxymethylcellulose (REFRESH PLUS) 0.5 % SOLN Place 1 drop into both eyes 2 (two) times daily as needed (dry eye). 04/11/21   [provider]  ?Carboxymethylcellulose Sodium 1 % GEL Place 1 drop into both eyes as needed (eye glued together). 12/09/20   [provider]   ?Cholecalciferol 50 MCG (2000 UT) TABS Take 2,000 Units by mouth 3 (three) times a week. 06/21/20   [provider]  ?citalopram (CELEXA) 20 MG tablet Take 20 mg by mouth daily.    [provider]  ?diltiazem (CARDIZEM CD) 180 MG 24 hr capsule Take 1 capsule (180 mg total) by mouth daily. 08/24/21   Patrecia Pour, MD  ?finasteride (PROSCAR) 5 MG tablet Take 5 mg by mouth daily. 06/21/20   [provider]  ?fluticasone (FLONASE) 50 MCG/ACT nasal spray Place 2 sprays into both nostrils daily. ?Patient not taking: Reported on 05/14/2021 02/15/21   Melynda Ripple, MD  ?gabapentin (NEURONTIN) 300 MG capsule Take 300 mg by mouth 2 (two) times daily. 05/11/20   [provider]  ?glipiZIDE (GLUCOTROL) 5 MG tablet Take 2.5-5 mg by mouth See admin instructions. Take 5 mg in the morning and 2.5 mg every evening    [provider]  ?glucose-Vitamin C 4-0.006 GM CHEW chewable tablet Chew 1 tablet by mouth as needed for low blood sugar. 09/30/20   [provider]  ?losartan (COZAAR) 100 MG tablet Take 100 mg by mouth daily.    [provider]  ?Magnesium Oxide 420 MG TABS Take 420 mg by mouth daily. 12/19/20   [provider]  ?metFORMIN (GLUCOPHAGE) 1000 MG tablet Take 1,000-1,500 mg by mouth See admin instructions. Take 1000 mg by mouth in the  morning and take 1500 mg in the evening    [provider]  ?methimazole (TAPAZOLE) 5 MG tablet Take 1 tablet (5 mg total) by mouth daily. Hyperthyroidism: E05.90 07/01/19   Renato Shin, MD  ?metoprolol tartrate (LOPRESSOR) 25 MG tablet Take 1 tablet (25 mg total) by mouth 2 (two) times daily. 03/06/19   Nahser, Wonda Cheng, MD  ?metoprolol tartrate (LOPRESSOR) 50 MG tablet Take 1 tablet (50 mg total) by mouth 2 (two) times daily. 08/23/21   Patrecia Pour, MD  ?Multiple Vitamin (MULTIVITAMIN WITH MINERALS) TABS tablet Take 1 tablet by mouth daily. High potency    [provider]  ?Multiple Vitamins-Minerals  (ZINC PO) Take 1 tablet by mouth daily.    [provider]  ?omeprazole (PRILOSEC) 20 MG capsule Take 20 mg by mouth daily.    [provider]  ?QUEtiapine (SEROQUEL) 50 MG tablet Take 50 mg by mouth at bedtime.    [provider]  ?risperiDONE (RISPERDAL) 1 MG tablet Take 1 mg by mouth at bedtime.    [provider]  ?Semaglutide,0.25 or 0.'5MG'$ /DOS, 2 MG/1.5ML SOPN Inject 0.5 mg into the skin every Monday. 05/03/21   [provider]  ?tamsulosin (FLOMAX) 0.4 MG CAPS capsule Take 0.8 mg by mouth at bedtime. 10/28/19   [provider]  ?vitamin B-12 (CYANOCOBALAMIN) 500 MCG tablet Take 500 mcg by mouth in the morning and at bedtime. 06/21/20   [provider]  ?   ? ?Allergies    ?Empagliflozin, Haldol [haloperidol], Lisinopril, and Sulfamethoxazole-trimethoprim   ? ?Review of Systems   ?Review of Systems  ?Constitutional:  Negative for fever.  ?Respiratory:  Positive for shortness of breath.   ? ?Physical Exam ?Updated Vital Signs ?BP 134/61   Pulse 63   Temp (!) 97.3 ?F (36.3 ?C) (Oral)   Resp (!) 25   Ht 1.803 m ('5\' 11"'$ )   Wt 92.5 kg   SpO2 93%   BMI 28.45 kg/m?  ?Physical Exam ?Vitals and nursing note reviewed.  ?Constitutional:   ?   Appearance: He is well-developed. He is ill-appearing. He is not diaphoretic.  ?HENT:  ?   Head: Normocephalic and atraumatic.  ?   Right Ear: External ear normal.  ?   Left Ear: External ear normal.  ?Eyes:  ?   General: No scleral icterus.    ?   Right eye: No discharge.     ?   Left eye: No discharge.  ?   Conjunctiva/sclera: Conjunctivae normal.  ?Neck:  ?   Trachea: No tracheal deviation.  ?Cardiovascular:  ?   Rate and Rhythm: Normal rate and regular rhythm.  ?Pulmonary:  ?   Effort: Pulmonary effort is normal. No respiratory distress.  ?   Breath sounds: No stridor. Wheezing present. No rales.  ?Abdominal:  ?   General: Bowel sounds are normal. There is no distension.  ?   Palpations: Abdomen is soft.  ?    Tenderness: There is no abdominal tenderness. There is no guarding or rebound.  ?Musculoskeletal:     ?   General: No tenderness or deformity.  ?   Cervical back: Neck supple.  ?   Right lower leg: Edema present.  ?   Left lower leg: Edema present.  ?Skin: ?   General: Skin is warm and dry.  ?   Findings: No rash.  ?Neurological:  ?   General: No focal deficit present.  ?   Mental Status: He is alert.  ?  Cranial Nerves: No cranial nerve deficit (no facial droop, extraocular movements intact, no slurred speech).  ?   Sensory: No sensory deficit.  ?   Motor: No abnormal muscle tone or seizure activity.  ?   Coordination: Coordination normal.  ?Psychiatric:     ?   Mood and Affect: Mood normal.  ? ? ?ED Results / Procedures / Treatments   ?Labs ?(all labs ordered are listed, but only abnormal results are displayed) ?Labs Reviewed  ?BASIC METABOLIC PANEL - Abnormal; Notable for the following components:  ?    Result Value  ? Sodium 126 (*)   ? Chloride 93 (*)   ? Glucose, Bld 202 (*)   ? Calcium 8.7 (*)   ? All other components within normal limits  ?BRAIN NATRIURETIC PEPTIDE - Abnormal; Notable for the following components:  ? B Natriuretic Peptide 315.0 (*)   ? All other components within normal limits  ?CBC - Abnormal; Notable for the following components:  ? WBC 12.2 (*)   ? RBC 3.81 (*)   ? Hemoglobin 9.9 (*)   ? HCT 31.4 (*)   ? RDW 17.0 (*)   ? All other components within normal limits  ?RESP PANEL BY RT-PCR (FLU A&B, COVID) ARPGX2  ?MAGNESIUM  ?SODIUM, URINE, RANDOM  ?TROPONIN I (HIGH SENSITIVITY)  ? ? ?EKG ?EKG Interpretation ? ?Date/Time:  Saturday Sep 24 2021 13:58:20 EDT ?Ventricular Rate:  111 ?PR Interval:  149 ?QRS Duration: 126 ?QT Interval:  400 ?QTC Calculation: 544 ?R Axis:   99 ?Text Interpretation: Possible atrial arrhythmia Nonspecific intraventricular conduction delay , new since last tracing Lateral infarct, age indeterminate Anteroseptal infarct, old Confirmed by Dorie Rank [phone]) on  09/24/2021 2:01:52 PM ? ?Radiology ?DG Chest Portable 1 View ? ?Result Date: 09/24/2021 ?CLINICAL DATA:  dyspnea EXAM: PORTABLE CHEST 1 VIEW COMPARISON:  Chest radiograph dated August 21, 2021 FINDINGS: The cardiomediastinal silh

## 2021-09-24 NOTE — Assessment & Plan Note (Signed)
New oxygen requirement in the setting of CHF exacerbation ?Positive pulmonary edema on imaging ?IV diuresis started in the ER ?Monitor volume status ?Continue supplemental oxygen ?Follow ? ?

## 2021-09-24 NOTE — Assessment & Plan Note (Signed)
Stable Continue methimazole 

## 2021-09-24 NOTE — ED Notes (Signed)
MD made aware of abnormal heart rhythm, EKG handed to MD ?

## 2021-09-24 NOTE — Assessment & Plan Note (Signed)
Patient self caths at home twice a day per family ?We will continue Flomax and finasteride ?Continue with self caths as needed ?Follow ?

## 2021-09-24 NOTE — ED Triage Notes (Signed)
Patient c/o shortness of breath x1 week. Patient states worse with ambulating. Patient using inhaler with no relief, last used x1 hour ago. Denies chest pain. Per patient nonproductive cough. Patient has hx of A-fib and now has significant amount of swelling in abd and lower extremities. Denies hx of CHF. Patient seen at [facility] and placed on Lasix.  ?

## 2021-09-24 NOTE — Assessment & Plan Note (Signed)
SSI A1c 

## 2021-09-24 NOTE — Assessment & Plan Note (Signed)
New O2 requirement in the setting of pulmonary edema on chest x-ray and mildly elevated BNP at 315 ?Clinically with CHF exacerbation ?Patient does appear to have a cardiologist associated with the [facility] system. ?However, notes are unavailable at present ?We will plan to gently diurese patient ?Check 2D echo ?Strict ins and outs and daily weights ?We will otherwise follow closely ?

## 2021-09-24 NOTE — Assessment & Plan Note (Signed)
Baseline atrial fibrillation on Eliquis ?Appears stable at present ?Continue home regimen including Eliquis ?Monitor hemodynamics in the setting of diuresis ?Follow closely ?

## 2021-09-24 NOTE — Assessment & Plan Note (Signed)
BP stable Titrate home regimen 

## 2021-09-24 NOTE — H&P (Signed)
?History and Physical  ? ? ?Patient: Darrell Lang GLO:[id number] DOB: December 31, 1945 ?DOA: 09/24/2021 ?DOS: the patient was seen and examined on 09/24/2021 ?PCP: [facility]  ?Patient coming from: Home ? ?Chief Complaint:  ?Chief Complaint  ?Patient presents with  ? Shortness of Breath  ? ?HPI: AARION Lang is a 76 y.o. male with medical history significant of A-fib with RVR, diabetes, enlarged prostate with urinary obstruction requiring self cath daily, hypertension presenting with acute respiratory failure with hypoxia and CHF exacerbation.  Per report, Patient has had increased work of breathing over the past 2 to 3 weeks.  Per the family, patient gets care to the [place].  Patient has a cardiologist associated with the [facility].  He was seen by his primary care doctor with concern for shortness of breath.  Patient was prescribed Lasix as needed.  Per the family, patient then had follow-up with his cardiologist who recommended he take the Lasix daily.  This was 20 mg daily.  Per the family, patient has had increased work of breathing despite the Lasix.  Positive salt intake.  Positive orthopnea and PND.  No reported chest pain.  No nausea or vomiting.  No hemiparesis or confusion.  Baseline atrial fibrillation.  Patient has been compliant with home medication regimen including anticoagulation.  Per the family, there was some concern by the cardiologist that patient being recently started on diltiazem may have exacerbated the heart failure.  Per the family, they were redirected to go to the ER by the cardiologist given symptoms. ?Presented to the ER afebrile, hemodynamically stable.  Satting in the mid 80s on room air.  Transitioned to 2 L nasal cannula.  Labs grossly within normal numbers apart from a BNP at 315.  Chest x-ray with pulmonary edema. ?Review of Systems: As mentioned in the history of present illness. All other systems reviewed and are negative. ?Past Medical History:  ?Diagnosis Date  ? Anxiety and  depression   ? Atrial fibrillation with RVR ([place]) 02/08/2019  ? COVID-19 virus infection 02/09/2019  ? Diabetes ([place])   ? Encounter for screening colonoscopy 07/28/2013  ? Essential hypertension 02/09/2019  ? Family history of adverse reaction to anesthesia   ? Hyperlipidemia 02/09/2019  ? Hypertension   ? Hypertriglyceridemia   ? ?Past Surgical History:  ?Procedure Laterality Date  ? APPENDECTOMY    ? 6th grade  ? COLONOSCOPY  01/14/2004  ? Dr. Gala Romney: internal hemorrhoids, otherwise normal rectum. Normal colon and normal TI  ? COLONOSCOPY N/A 08/06/2013  ? normal, internal hemorrhoids  ? COLONOSCOPY WITH PROPOFOL N/A 07/04/2021  ? Procedure: COLONOSCOPY WITH PROPOFOL;  Surgeon: [name], DO;  [facility];  Service: Endoscopy;  Laterality: N/A;  2:15pm  ? ESOPHAGOGASTRODUODENOSCOPY (EGD) WITH PROPOFOL N/A 07/04/2021  ? Procedure: ESOPHAGOGASTRODUODENOSCOPY (EGD) WITH PROPOFOL;  Surgeon: [name], DO;  [facility];  Service: Endoscopy;  Laterality: N/A;  ? POLYPECTOMY  07/04/2021  ? Procedure: POLYPECTOMY;  Surgeon: [name], DO;  Location: [facility];  Service: Endoscopy;;  ? ?Social History:  reports that he has quit smoking. His smoking use included cigarettes. He has never used smokeless tobacco. He reports that he does not drink alcohol and does not use drugs. ? ?Allergies  ?Allergen Reactions  ? Empagliflozin   ?  Other reaction(s): Urinary tract infectious disease  ? Haldol [Haloperidol]   ?  "Messed up his thoughts" pt takes risperidone   ? Lisinopril Cough  ? Sulfamethoxazole-Trimethoprim   ?  Unknown reaction  ? ? ?Family History  ?Problem Relation Age of Onset  ? Stroke Father   ? Colon cancer Neg Hx   ? Thyroid disease Neg Hx   ? Colon polyps Neg Hx   ? ? ?Prior to Admission medications   ?Medication Sig Start Date End Date Taking? Authorizing Provider  ?amLODipine (NORVASC) 5 MG tablet Take 1 tablet (5 mg total) by mouth daily. 08/12/19   Richardson Dopp T,  PA-C  ?apixaban (ELIQUIS) 5 MG TABS tablet Take 1 tablet (5 mg total) by mouth 2 (two) times daily. 02/24/19   Nahser, Wonda Cheng, MD  ?atorvastatin (LIPITOR) 10 MG tablet Take 10 mg by mouth daily. 06/21/20   [provider]  ?benztropine (COGENTIN) 1 MG tablet Take 1 mg by mouth at bedtime.    [provider]  ?busPIRone (BUSPAR) 5 MG tablet Take 2.5 mg by mouth daily. 09/21/20   [provider]  ?carboxymethylcellulose (REFRESH PLUS) 0.5 % SOLN Place 1 drop into both eyes 2 (two) times daily as needed (dry eye). 04/11/21   [provider]  ?Carboxymethylcellulose Sodium 1 % GEL Place 1 drop into both eyes as needed (eye glued together). 12/09/20   [provider]  ?Cholecalciferol 50 MCG (2000 UT) TABS Take 2,000 Units by mouth 3 (three) times a week. 06/21/20   [provider]  ?citalopram (CELEXA) 20 MG tablet Take 20 mg by mouth daily.    [provider]  ?diltiazem (CARDIZEM CD) 180 MG 24 hr capsule Take 1 capsule (180 mg total) by mouth daily. 08/24/21   Patrecia Pour, MD  ?finasteride (PROSCAR) 5 MG tablet Take 5 mg by mouth daily. 06/21/20   [provider]  ?fluticasone (FLONASE) 50 MCG/ACT nasal spray Place 2 sprays into both nostrils daily. ?Patient not taking: Reported on 05/14/2021 02/15/21   Melynda Ripple, MD  ?gabapentin (NEURONTIN) 300 MG capsule Take 300 mg by mouth 2 (two) times daily. 05/11/20   [provider]  ?glipiZIDE (GLUCOTROL) 5 MG tablet Take 2.5-5 mg by mouth See admin instructions. Take 5 mg in the morning and 2.5 mg every evening    [provider]  ?glucose-Vitamin C 4-0.006 GM CHEW chewable tablet Chew 1 tablet by mouth as needed for low blood sugar. 09/30/20   [provider]  ?losartan (COZAAR) 100 MG tablet Take 100 mg by mouth daily.    [provider]  ?Magnesium Oxide 420 MG TABS Take 420 mg by mouth daily. 12/19/20   [provider]  ?metFORMIN (GLUCOPHAGE) 1000 MG  tablet Take 1,000-1,500 mg by mouth See admin instructions. Take 1000 mg by mouth in the morning and take 1500 mg in the evening    [provider]  ?methimazole (TAPAZOLE) 5 MG tablet Take 1 tablet (5 mg total) by mouth daily. Hyperthyroidism: E05.90 07/01/19   Renato Shin, MD  ?metoprolol tartrate (LOPRESSOR) 25 MG tablet Take 1 tablet (25 mg total) by mouth 2 (two) times daily. 03/06/19   Nahser, Wonda Cheng, MD  ?metoprolol tartrate (LOPRESSOR) 50 MG tablet Take 1 tablet (50 mg total) by mouth 2 (two) times daily. 08/23/21   Patrecia Pour, MD  ?Multiple Vitamin (MULTIVITAMIN WITH MINERALS) TABS tablet Take 1 tablet by mouth daily. High potency    [provider]  ?Multiple Vitamins-Minerals (ZINC PO) Take 1 tablet by mouth daily.    [provider]  ?omeprazole (PRILOSEC) 20 MG capsule Take 20 mg by mouth daily.    [provider]  ?QUEtiapine (SEROQUEL) 50 MG tablet Take 50 mg by mouth at bedtime.    [provider]  ?risperiDONE (RISPERDAL) 1 MG tablet Take 1 mg by mouth at bedtime.    [provider]  ?Semaglutide,0.25 or 0.'5MG'$ /DOS, 2 MG/1.5ML SOPN Inject 0.5 mg into the skin every Monday. 05/03/21   [provider]  ?tamsulosin (FLOMAX) 0.4 MG CAPS capsule Take 0.8 mg by mouth at bedtime. 10/28/19   [provider]  ?vitamin B-12 (CYANOCOBALAMIN) 500 MCG tablet Take 500 mcg by mouth in the morning and at bedtime. 06/21/20   [provider]  ? ? ?Physical Exam: ?Vitals:  ? 09/24/21 1430 09/24/21 1500 09/24/21 1515 09/24/21 1545  ?BP: 127/64 134/61 140/72   ?Pulse: 61 63 73 68  ?Resp: (!) 24 (!) 25 (!) 24 (!) 21  ?Temp:      ?TempSrc:      ?SpO2: 94% 93% 93% 92%  ?Weight:      ?Height:      ? ?Physical Exam ?Constitutional:   ?   General: He is not in acute distress. ?HENT:  ?   Head: Normocephalic.  ?Cardiovascular:  ?   Rate and Rhythm: Normal rate. Rhythm irregular.  ?Pulmonary:  ?   Effort: Pulmonary effort is normal.  ?Abdominal:   ?   Comments: Obese abdomen  ?Mild abd distension  ?  ?Musculoskeletal:     ?   General: Normal range of motion.  ?   Cervical back: Normal range of motion.  ?   Comments: 1-2+ pitting edema  ?  ?Neurologica

## 2021-09-24 NOTE — Assessment & Plan Note (Signed)
Na 126 in setting of volume overload  ?Hypervolemic hyponatremia  ?Monitor Na w/ diuresis  ?Urine and serum studies.  ?Follow  ? ?

## 2021-09-25 DIAGNOSIS — I5031 Acute diastolic (congestive) heart failure: Secondary | ICD-10-CM | POA: Diagnosis present

## 2021-09-25 DIAGNOSIS — Z882 Allergy status to sulfonamides status: Secondary | ICD-10-CM | POA: Diagnosis not present

## 2021-09-25 DIAGNOSIS — Z79899 Other long term (current) drug therapy: Secondary | ICD-10-CM | POA: Diagnosis not present

## 2021-09-25 DIAGNOSIS — F419 Anxiety disorder, unspecified: Secondary | ICD-10-CM | POA: Diagnosis present

## 2021-09-25 DIAGNOSIS — R0602 Shortness of breath: Secondary | ICD-10-CM | POA: Diagnosis present

## 2021-09-25 DIAGNOSIS — N401 Enlarged prostate with lower urinary tract symptoms: Secondary | ICD-10-CM | POA: Diagnosis present

## 2021-09-25 DIAGNOSIS — R21 Rash and other nonspecific skin eruption: Secondary | ICD-10-CM | POA: Diagnosis present

## 2021-09-25 DIAGNOSIS — Z87891 Personal history of nicotine dependence: Secondary | ICD-10-CM | POA: Diagnosis not present

## 2021-09-25 DIAGNOSIS — E119 Type 2 diabetes mellitus without complications: Secondary | ICD-10-CM | POA: Diagnosis present

## 2021-09-25 DIAGNOSIS — E871 Hypo-osmolality and hyponatremia: Secondary | ICD-10-CM | POA: Diagnosis present

## 2021-09-25 DIAGNOSIS — F32A Depression, unspecified: Secondary | ICD-10-CM | POA: Diagnosis present

## 2021-09-25 DIAGNOSIS — Z8616 Personal history of COVID-19: Secondary | ICD-10-CM | POA: Diagnosis not present

## 2021-09-25 DIAGNOSIS — E0590 Thyrotoxicosis, unspecified without thyrotoxic crisis or storm: Secondary | ICD-10-CM | POA: Diagnosis present

## 2021-09-25 DIAGNOSIS — D649 Anemia, unspecified: Secondary | ICD-10-CM | POA: Diagnosis present

## 2021-09-25 DIAGNOSIS — Z7901 Long term (current) use of anticoagulants: Secondary | ICD-10-CM | POA: Diagnosis not present

## 2021-09-25 DIAGNOSIS — Z888 Allergy status to other drugs, medicaments and biological substances status: Secondary | ICD-10-CM | POA: Diagnosis not present

## 2021-09-25 DIAGNOSIS — I482 Chronic atrial fibrillation, unspecified: Secondary | ICD-10-CM | POA: Diagnosis present

## 2021-09-25 DIAGNOSIS — E1165 Type 2 diabetes mellitus with hyperglycemia: Secondary | ICD-10-CM | POA: Diagnosis not present

## 2021-09-25 DIAGNOSIS — I110 Hypertensive heart disease with heart failure: Secondary | ICD-10-CM | POA: Diagnosis present

## 2021-09-25 DIAGNOSIS — E781 Pure hyperglyceridemia: Secondary | ICD-10-CM | POA: Diagnosis present

## 2021-09-25 DIAGNOSIS — E785 Hyperlipidemia, unspecified: Secondary | ICD-10-CM | POA: Diagnosis present

## 2021-09-25 DIAGNOSIS — J9601 Acute respiratory failure with hypoxia: Secondary | ICD-10-CM | POA: Diagnosis present

## 2021-09-25 DIAGNOSIS — Z20822 Contact with and (suspected) exposure to covid-19: Secondary | ICD-10-CM | POA: Diagnosis present

## 2021-09-25 DIAGNOSIS — N320 Bladder-neck obstruction: Secondary | ICD-10-CM | POA: Diagnosis present

## 2021-09-25 DIAGNOSIS — Z7984 Long term (current) use of oral hypoglycemic drugs: Secondary | ICD-10-CM | POA: Diagnosis not present

## 2021-09-25 DIAGNOSIS — Z823 Family history of stroke: Secondary | ICD-10-CM | POA: Diagnosis not present

## 2021-09-25 LAB — CBC WITH DIFFERENTIAL/PLATELET
Abs Immature Granulocytes: 0.07 10*3/uL (ref 0.00–0.07)
Basophils Absolute: 0 10*3/uL (ref 0.0–0.1)
Basophils Relative: 0 %
Eosinophils Absolute: 0.1 10*3/uL (ref 0.0–0.5)
Eosinophils Relative: 1 %
HCT: 30.3 % — ABNORMAL LOW (ref 39.0–52.0)
Hemoglobin: 9.8 g/dL — ABNORMAL LOW (ref 13.0–17.0)
Immature Granulocytes: 1 %
Lymphocytes Relative: 5 %
Lymphs Abs: 0.7 10*3/uL (ref 0.7–4.0)
MCH: 26.1 pg (ref 26.0–34.0)
MCHC: 32.3 g/dL (ref 30.0–36.0)
MCV: 80.6 fL (ref 80.0–100.0)
Monocytes Absolute: 1 10*3/uL (ref 0.1–1.0)
Monocytes Relative: 7 %
Neutro Abs: 11.8 10*3/uL — ABNORMAL HIGH (ref 1.7–7.7)
Neutrophils Relative %: 86 %
Platelets: 232 10*3/uL (ref 150–400)
RBC: 3.76 MIL/uL — ABNORMAL LOW (ref 4.22–5.81)
RDW: 17 % — ABNORMAL HIGH (ref 11.5–15.5)
WBC: 13.6 10*3/uL — ABNORMAL HIGH (ref 4.0–10.5)
nRBC: 0 % (ref 0.0–0.2)

## 2021-09-25 LAB — COMPREHENSIVE METABOLIC PANEL
ALT: 13 U/L (ref 0–44)
ALT: 14 U/L (ref 0–44)
AST: 15 U/L (ref 15–41)
AST: 17 U/L (ref 15–41)
Albumin: 3.8 g/dL (ref 3.5–5.0)
Albumin: 4 g/dL (ref 3.5–5.0)
Alkaline Phosphatase: 110 U/L (ref 38–126)
Alkaline Phosphatase: 115 U/L (ref 38–126)
Anion gap: 9 (ref 5–15)
Anion gap: 10 (ref 5–15)
BUN: 11 mg/dL (ref 8–23)
BUN: 9 mg/dL (ref 8–23)
CO2: 25 mmol/L (ref 22–32)
CO2: 27 mmol/L (ref 22–32)
Calcium: 8.4 mg/dL — ABNORMAL LOW (ref 8.9–10.3)
Calcium: 8.5 mg/dL — ABNORMAL LOW (ref 8.9–10.3)
Chloride: 92 mmol/L — ABNORMAL LOW (ref 98–111)
Chloride: 89 mmol/L — ABNORMAL LOW (ref 98–111)
Creatinine, Ser: 0.7 mg/dL (ref 0.61–1.24)
Creatinine, Ser: 0.65 mg/dL (ref 0.61–1.24)
GFR, Estimated: >60 mL/min (ref >60)
GFR, Estimated: >60 mL/min (ref >60)
Glucose, Bld: 105 mg/dL — ABNORMAL HIGH (ref 70–99)
Glucose, Bld: 118 mg/dL — ABNORMAL HIGH (ref 70–99)
Potassium: 3.3 mmol/L — ABNORMAL LOW (ref 3.5–5.1)
Potassium: 3.2 mmol/L — ABNORMAL LOW (ref 3.5–5.1)
Sodium: 126 mmol/L — ABNORMAL LOW (ref 135–145)
Sodium: 126 mmol/L — ABNORMAL LOW (ref 135–145)
Total Bilirubin: 0.6 mg/dL (ref 0.3–1.2)
Total Bilirubin: 1 mg/dL (ref 0.3–1.2)
Total Protein: 7.3 g/dL (ref 6.5–8.1)
Total Protein: 7.4 g/dL (ref 6.5–8.1)

## 2021-09-25 LAB — CREATININE, URINE, RANDOM: Creatinine, Urine: 12.85 mg/dL

## 2021-09-25 LAB — OSMOLALITY, URINE: Osmolality, Ur: 291 mOsm/kg — ABNORMAL LOW (ref 300–900)

## 2021-09-25 LAB — GLUCOSE, CAPILLARY
Glucose-Capillary: 129 mg/dL — ABNORMAL HIGH (ref 70–99)
Glucose-Capillary: 149 mg/dL — ABNORMAL HIGH (ref 70–99)

## 2021-09-25 LAB — OSMOLALITY: Osmolality: 263 mOsm/kg — ABNORMAL LOW (ref 275–295)

## 2021-09-25 LAB — SODIUM, URINE, RANDOM: Sodium, Ur: 109 mmol/L

## 2021-09-25 MED ORDER — APIXABAN 5 MG PO TABS
5.0000 mg | ORAL_TABLET | Freq: Two times a day (BID) | ORAL | Status: DC
Start: 2021-09-25 — End: 2021-09-27
  Administered 2021-09-25 – 2021-09-27 (×5): 5 mg via ORAL
  Filled 2021-09-25 (×5): qty 1

## 2021-09-25 MED ORDER — TAMSULOSIN HCL 0.4 MG PO CAPS
0.8000 mg | ORAL_CAPSULE | Freq: Every day | ORAL | Status: DC
  Administered 2021-09-25 – 2021-09-26 (×2): 0.8 mg via ORAL
  Filled 2021-09-25 (×2): qty 2

## 2021-09-25 MED ORDER — INSULIN ASPART 100 UNIT/ML IJ SOLN: 0.0000 [IU] | Freq: Every day | INTRAMUSCULAR | Status: DC

## 2021-09-25 MED ORDER — PANTOPRAZOLE SODIUM 40 MG PO TBEC
40.0000 mg | DELAYED_RELEASE_TABLET | Freq: Every day | ORAL | Status: DC
  Administered 2021-09-25 – 2021-09-27 (×3): 40 mg via ORAL
  Filled 2021-09-25 (×3): qty 1

## 2021-09-25 MED ORDER — RISPERIDONE 1 MG PO TABS
1.0000 mg | ORAL_TABLET | Freq: Every day | ORAL | Status: DC
Start: 2021-09-25 — End: 2021-09-27
  Administered 2021-09-25 – 2021-09-26 (×2): 1 mg via ORAL
  Filled 2021-09-25 (×2): qty 1

## 2021-09-25 MED ORDER — BENZTROPINE MESYLATE 1 MG PO TABS
1.0000 mg | ORAL_TABLET | Freq: Every day | ORAL | Status: DC
  Administered 2021-09-25 – 2021-09-26 (×2): 1 mg via ORAL
  Filled 2021-09-25 (×2): qty 1

## 2021-09-25 MED ORDER — FINASTERIDE 5 MG PO TABS
5.0000 mg | ORAL_TABLET | Freq: Every day | ORAL | Status: DC
  Administered 2021-09-25 – 2021-09-27 (×3): 5 mg via ORAL
  Filled 2021-09-25 (×3): qty 1

## 2021-09-25 MED ORDER — BISACODYL 10 MG RE SUPP
10.0000 mg | Freq: Once | RECTAL | Status: AC
  Administered 2021-09-25: 10 mg via RECTAL
  Filled 2021-09-25: qty 1

## 2021-09-25 MED ORDER — BUSPIRONE HCL 5 MG PO TABS
2.5000 mg | ORAL_TABLET | Freq: Every day | ORAL | Status: DC
  Administered 2021-09-25 – 2021-09-27 (×3): 2.5 mg via ORAL
  Filled 2021-09-25 (×3): qty 1

## 2021-09-25 MED ORDER — METOPROLOL TARTRATE 25 MG PO TABS
25.0000 mg | ORAL_TABLET | Freq: Two times a day (BID) | ORAL | Status: DC
  Administered 2021-09-25 – 2021-09-27 (×4): 25 mg via ORAL
  Filled 2021-09-25 (×4): qty 1

## 2021-09-25 MED ORDER — GABAPENTIN 300 MG PO CAPS
300.0000 mg | ORAL_CAPSULE | Freq: Two times a day (BID) | ORAL | Status: DC
  Administered 2021-09-25 – 2021-09-27 (×4): 300 mg via ORAL
  Filled 2021-09-25 (×4): qty 1

## 2021-09-25 MED ORDER — METHIMAZOLE 5 MG PO TABS
5.0000 mg | ORAL_TABLET | Freq: Every day | ORAL | Status: DC
  Administered 2021-09-25 – 2021-09-27 (×3): 5 mg via ORAL
  Filled 2021-09-25 (×3): qty 1

## 2021-09-25 MED ORDER — ATORVASTATIN CALCIUM 10 MG PO TABS
10.0000 mg | ORAL_TABLET | Freq: Every day | ORAL | Status: DC
  Administered 2021-09-25 – 2021-09-27 (×3): 10 mg via ORAL
  Filled 2021-09-25 (×3): qty 1

## 2021-09-25 MED ORDER — POTASSIUM CHLORIDE CRYS ER 20 MEQ PO TBCR
40.0000 meq | EXTENDED_RELEASE_TABLET | ORAL | Status: AC
  Administered 2021-09-25 (×2): 40 meq via ORAL
  Filled 2021-09-25 (×2): qty 2

## 2021-09-25 MED ORDER — INSULIN ASPART 100 UNIT/ML IJ SOLN
0.0000 [IU] | Freq: Three times a day (TID) | INTRAMUSCULAR | Status: DC
  Administered 2021-09-26: 3 [IU] via SUBCUTANEOUS

## 2021-09-25 MED ORDER — CITALOPRAM HYDROBROMIDE 20 MG PO TABS
20.0000 mg | ORAL_TABLET | Freq: Every day | ORAL | Status: DC
  Administered 2021-09-25 – 2021-09-27 (×3): 20 mg via ORAL
  Filled 2021-09-25 (×3): qty 1

## 2021-09-25 MED ORDER — QUETIAPINE FUMARATE 25 MG PO TABS
50.0000 mg | ORAL_TABLET | Freq: Every day | ORAL | Status: DC
Start: 2021-09-25 — End: 2021-09-27
  Administered 2021-09-25 – 2021-09-26 (×2): 50 mg via ORAL
  Filled 2021-09-25 (×2): qty 2

## 2021-09-25 NOTE — Progress Notes (Signed)
?PROGRESS NOTE ? ? ? ?Darrell Lang  GBT:[id number] DOB: July 17, 1945 DOA: 09/24/2021 ?PCP: [facility]  ? ? ?Brief Narrative:  ?76 year old male with a history of atrial fibrillation, enlarged prostate with bladder outlet obstruction, diabetes, hyperthyroidism.  He is admitted to the hospital with progressive shortness of breath and volume overload.  Found to have decompensated CHF and admitted for IV diuretics. ? ? ?Assessment & Plan: ?  ?Principal Problem: ?  Acute CHF (congestive heart failure) ([place]) ?Active Problems: ?  Essential hypertension ?  Hyperlipidemia ?  Diabetes ([place]) ?  Hyperthyroidism ?  Atrial fibrillation, chronic ([place]) ?  Enlarged prostate with urinary obstruction ?  Acute respiratory failure with hypoxia ([place]) ?  Hyponatremia ?  Acute diastolic CHF (congestive heart failure) ([place]) ? ? ?Acute diastolic congestive heart failure ?-Echo from 08/2021 shows EF of 60 to 65% ?-BNP 315 ?-Clinically, he has evidence of volume overload ?-Continue on IV Lasix ?-Appears to be having good urine output ?-He is on metoprolol ?-As blood pressure tolerates, will add back in losartan ? ?Acute respiratory failure with hypoxia ?-Noted to have saturations of 87% on room air on admission ?-On admission, he was also noted to be short of breath and wheezing ?-He was placed on supplemental oxygen with improvement in saturations ?-Currently on oxygen at 2 L ?-Wean off oxygen as tolerated ? ?Chronic atrial fibrillation ?-CHA2DS2-VASc 4 ?-Heart rate currently stable ?-Continue on metoprolol ?-Resume diltiazem as blood pressure will allow ?-Anticoagulated with Eliquis ? ?Hyponatremia ?-Appears to be hypervolemic hyponatremia ?-Continue fluid restriction and diuresis ?-Urine and serum sodium are low ?-Continue to follow with diuresis ? ?BPH with bladder outlet obstruction ?-Patient self caths at home ?-We will continue intermittent self catheterizations ?-Continue Flomax and finasteride ? ?Hyperthyroidism ?-Continue on  methimazole ? ?Diabetes ?-Holding oral agents ?-Continue on sliding scale insulin ? ?Hypertension ?-On losartan, metoprolol, diltiazem ?-Continued on metoprolol for now, resume remainder agents as blood pressure will allow ? ?Rash ?-Noted to have rash on right flank ?-Unclear etiology ?-Unlikely allergic reaction ?-Continue to monitor ? ?Hypokalemia ?-Replace ?-Check magnesium ? ?DVT prophylaxis: SCDs Start: 09/24/21 1557 ?apixaban (ELIQUIS) tablet 5 mg  ?Code Status: Full code ?Family Communication: Updated patient's wife over the phone ?Disposition Plan: Status is: Inpatient ?Remains inpatient appropriate because: Continued IV diuresis ? ? ? ? ?Consultants:  ? ? ?Procedures:  ? ? ?Antimicrobials:  ?  ? ? ?Subjective: ?Still feels short of breath.  He has noticed a rash on his right flank which is nontender.  Feels that abdomen is distended. ? ?Objective: ?Vitals:  ? 09/25/21 0442 09/25/21 0631 09/25/21 1100 09/25/21 1415  ?BP: (!) 143/68  (!) 159/68 (!) 154/83  ?Pulse: 65  82 87  ?Resp: 18     ?Temp: 97.7 ?F (36.5 ?C)   98.2 ?F (36.8 ?C)  ?TempSrc: Oral   Oral  ?SpO2: 94%   99%  ?Weight:  90 kg    ?Height:      ? ? ?Intake/Output Summary (Last 24 hours) at 09/25/2021 1731 ?Last data filed at 09/25/2021 1520 ?Gross per 24 hour  ?Intake 720 ml  ?Output 4850 ml  ?Net -4130 ml  ? ?Filed Weights  ? 09/24/21 1136 09/24/21 2039 09/25/21 0631  ?Weight: 92.5 kg 92.5 kg 90 kg  ? ? ?Examination: ? ?General exam: Appears calm and comfortable  ?Respiratory system: Crackles at bases, diminished breath sounds at bases. Respiratory effort normal. ?Cardiovascular system: S1 & S2 heard, RRR. No JVD, murmurs, rubs, gallops or clicks.  1+ pedal edema. ?Gastrointestinal system: Abdomen is distended, soft and nontender. No organomegaly or masses felt. Normal bowel sounds heard. ?Central nervous system: Alert and oriented. No focal neurological deficits. ?Extremities: Symmetric 5 x 5 power. ?Skin: Erythematous rash with clear  circumscribed area in the right flank, no vesicles appreciated, nontender ?Psychiatry: Judgement and insight appear normal. Mood & affect appropriate.  ? ? ? ?Data Reviewed: I have personally reviewed following labs and imaging studies ? ?CBC: ?Recent Labs  ?Lab 09/24/21 ?1339 09/25/21 ?0719  ?WBC 12.2* 13.6*  ?NEUTROABS  --  11.8*  ?HGB 9.9* 9.8*  ?HCT 31.4* 30.3*  ?MCV 82.4 80.6  ?PLT 233 232  ? ?Basic Metabolic Panel: ?Recent Labs  ?Lab 09/24/21 ?1339 09/24/21 ?1644 09/24/21 ?2353 09/25/21 ?0719  ?NA 126* 125* 126* 126*  ?K 4.4 4.2 3.3* 3.2*  ?CL 93* 93* 92* 89*  ?CO2 '24 24 25 27  '$ ?GLUCOSE 202* 154* 105* 118*  ?BUN '13 12 11 9  '$ ?CREATININE 0.78 0.75 0.70 0.65  ?CALCIUM 8.7* 8.7* 8.4* 8.5*  ?MG 1.8  --   --   --   ? ?GFR: ?Estimated Creatinine Clearance: 86.3 mL/min (by C-G formula based on SCr of 0.65 mg/dL). ?Liver Function Tests: ?Recent Labs  ?Lab 09/24/21 ?1644 09/24/21 ?2353 09/25/21 ?0719  ?AST '17 15 17  '$ ?ALT '12 13 14  '$ ?ALKPHOS 112 110 115  ?BILITOT 0.7 0.6 1.0  ?PROT 7.3 7.3 7.4  ?ALBUMIN 4.0 3.8 4.0  ? ?No results for input(s): LIPASE, AMYLASE in the last 168 hours. ?No results for input(s): AMMONIA in the last 168 hours. ?Coagulation Profile: ?No results for input(s): INR, PROTIME in the last 168 hours. ?Cardiac Enzymes: ?No results for input(s): CKTOTAL, CKMB, CKMBINDEX, TROPONINI in the last 168 hours. ?BNP (last 3 results) ?No results for input(s): PROBNP in the last 8760 hours. ?HbA1C: ?No results for input(s): HGBA1C in the last 72 hours. ?CBG: ?Recent Labs  ?Lab 09/25/21 ?0726  ?GLUCAP 129*  ? ?Lipid Profile: ?No results for input(s): CHOL, HDL, LDLCALC, TRIG, CHOLHDL, LDLDIRECT in the last 72 hours. ?Thyroid Function Tests: ?No results for input(s): TSH, T4TOTAL, FREET4, T3FREE, THYROIDAB in the last 72 hours. ?Anemia Panel: ?No results for input(s): VITAMINB12, FOLATE, FERRITIN, TIBC, IRON, RETICCTPCT in the last 72 hours. ?Sepsis Labs: ?No results for input(s): PROCALCITON, LATICACIDVEN in the  last 168 hours. ? ?Recent Results (from the past 240 hour(s))  ?Resp Panel by RT-PCR (Flu A&B, Covid) Nasopharyngeal Swab     Status: None  ? Collection Time: 09/24/21  3:06 PM  ? Specimen: Nasopharyngeal Swab; Nasopharyngeal(NP) swabs in vial transport medium  ?Result Value Ref Range Status  ? SARS Coronavirus 2 by RT PCR NEGATIVE NEGATIVE Final  ?  Comment: (NOTE) ?SARS-CoV-2 target nucleic acids are NOT DETECTED. ? ?The SARS-CoV-2 RNA is generally detectable in upper respiratory ?specimens during the acute phase of infection. The lowest ?concentration of SARS-CoV-2 viral copies this assay can detect is ?138 copies/mL. A negative result does not preclude SARS-Cov-2 ?infection and should not be used as the sole basis for treatment or ?other patient management decisions. A negative result may occur with  ?improper specimen collection/handling, submission of specimen other ?than nasopharyngeal swab, presence of viral mutation(s) within the ?areas targeted by this assay, and inadequate number of viral ?copies(<138 copies/mL). A negative result must be combined with ?clinical observations, patient history, and epidemiological ?information. The expected result is Negative. ? ?Fact Sheet for Patients:  ?EntrepreneurPulse.com.au ? ?Fact Sheet for Healthcare Providers:  ?IncredibleEmployment.be ? ?  This test is no t yet approved or cleared by the [place] FDA and  ?has been authorized for detection and/or diagnosis of SARS-CoV-2 by ?FDA under an Emergency Use Authorization (EUA). This EUA will remain  ?in effect (meaning this test can be used) for the duration of the ?COVID-19 declaration under Section 564(b)(1) of the Act, 21 ?U.S.C.section 360bbb-3(b)(1), unless the authorization is terminated  ?or revoked sooner.  ? ? ?  ? Influenza A by PCR NEGATIVE NEGATIVE Final  ? Influenza B by PCR NEGATIVE NEGATIVE Final  ?  Comment: (NOTE) ?The Xpert Xpress SARS-CoV-2/FLU/RSV plus assay is  intended as an aid ?in the diagnosis of influenza from Nasopharyngeal swab specimens and ?should not be used as a sole basis for treatment. Nasal washings and ?aspirates are unacceptable for Xpert Xpress SARS-CoV-2/F

## 2021-09-26 DIAGNOSIS — D649 Anemia, unspecified: Secondary | ICD-10-CM

## 2021-09-26 LAB — BASIC METABOLIC PANEL
Anion gap: 9 (ref 5–15)
BUN: 7 mg/dL — ABNORMAL LOW (ref 8–23)
CO2: 30 mmol/L (ref 22–32)
Calcium: 9.4 mg/dL (ref 8.9–10.3)
Chloride: 93 mmol/L — ABNORMAL LOW (ref 98–111)
Creatinine, Ser: 0.79 mg/dL (ref 0.61–1.24)
GFR, Estimated: >60 mL/min (ref >60)
Glucose, Bld: 111 mg/dL — ABNORMAL HIGH (ref 70–99)
Potassium: 3.6 mmol/L (ref 3.5–5.1)
Sodium: 132 mmol/L — ABNORMAL LOW (ref 135–145)

## 2021-09-26 LAB — GLUCOSE, CAPILLARY
Glucose-Capillary: 110 mg/dL — ABNORMAL HIGH (ref 70–99)
Glucose-Capillary: 163 mg/dL — ABNORMAL HIGH (ref 70–99)
Glucose-Capillary: 94 mg/dL (ref 70–99)
Glucose-Capillary: 116 mg/dL — ABNORMAL HIGH (ref 70–99)

## 2021-09-26 LAB — CBC
HCT: 36.2 % — ABNORMAL LOW (ref 39.0–52.0)
Hemoglobin: 11.2 g/dL — ABNORMAL LOW (ref 13.0–17.0)
MCH: 25.4 pg — ABNORMAL LOW (ref 26.0–34.0)
MCHC: 30.9 g/dL (ref 30.0–36.0)
MCV: 82.1 fL (ref 80.0–100.0)
Platelets: 320 10*3/uL (ref 150–400)
RBC: 4.41 MIL/uL (ref 4.22–5.81)
RDW: 17.1 % — ABNORMAL HIGH (ref 11.5–15.5)
WBC: 11.5 10*3/uL — ABNORMAL HIGH (ref 4.0–10.5)
nRBC: 0 % (ref 0.0–0.2)

## 2021-09-26 MED ORDER — SORBITOL 70 % SOLN
960.0000 mL | TOPICAL_OIL | Freq: Once | ORAL | Status: AC
  Administered 2021-09-26: 960 mL via RECTAL
  Filled 2021-09-26: qty 473

## 2021-09-26 MED ORDER — POTASSIUM CHLORIDE CRYS ER 20 MEQ PO TBCR: 20.0000 meq | EXTENDED_RELEASE_TABLET | Freq: Every day | ORAL | 1 refills | Status: DC

## 2021-09-26 MED ORDER — FUROSEMIDE 40 MG PO TABS: 40.0000 mg | ORAL_TABLET | Freq: Every day | ORAL | 11 refills | Status: DC

## 2021-09-26 NOTE — Progress Notes (Signed)
Patient ambulated around the unit with this nurse. His oxygen did not drop below 95% on Room Air.  ?

## 2021-09-26 NOTE — Consult Note (Signed)
[facility] CM Inpatient Consult ? ? ?09/26/2021 ? ?Humberto Leep ?06-11-1945 ?[id number] ? ?[facility] [facility] CM) ?  ?Patient chart has been reviewed with noted high risk score for unplanned readmissions.  Patient assessed for community [place] Management follow up needs. Per review, patient has [facility] as primary provider. [facility] CM does not follow as patient provider office not in network of physicians. ?  ?Of note, [facility] Care Management services does not replace or interfere with any services that are arranged by inpatient case management or social work.  ?  ?Netta Cedars, MSN, RN ?[facility] Liaison ?Phone [phone] ?Toll free office [phone] ?

## 2021-09-26 NOTE — Progress Notes (Signed)
?   09/26/21 0908  ?ReDS Vest / Clip  ?Station Marker D  ?Ruler Value 31  ?ReDS Value Range < 36  ?ReDS Actual Value 31  ? ? ?Patient states he is breathing better today. Reds Clip 31%. States he is not on oxygen at home. Patient was on 1L with 99% sat. Took off oxygen -- maintained sats of 98% on room air. ?

## 2021-09-26 NOTE — TOC Transition Note (Signed)
Transition of Care (TOC) - CM/SW Discharge Note ? ? ?Patient Details  ?Name: Darrell Lang ?MRN: [id number] ?Date of Birth: 10/14/1945 ? ?Transition of Care (TOC) CM/SW Contact:  ?Salome Arnt, LCSW ?Phone Number: ?09/26/2021, 3:57 PM ? ? ?Clinical Narrative:  Pt agreeable to [facility] with [place]. Referred and accepted by [facility]. No other needs reported. MD notified for order.   ? ? ? ?Final next level of care: [place] ?Barriers to Discharge: Barriers Resolved ? ? ?Patient Goals and CMS Choice ?Patient states their goals for this hospitalization and ongoing recovery are:: return home ?  ?Choice offered to / list presented to : Patient ? ?Discharge Placement ?  ?           ?  ?  ?  ?Patient and family notified of of transfer: 09/26/21 ? ?Discharge Plan and Services ?In-house Referral: Clinical Social Work ?  ?           ?  ?  ?  ?  ?  ?HH Arranged: RN ?[place] Agency: Microbiologist ([place]) ?Date HH Agency Contacted: 09/26/21 ?Time [place]: 1556 ?Representative spoke with at [place]: Vaughan Basta ? ?Social Determinants of Health (SDOH) Interventions ?  ? ? ?Readmission Risk Interventions ? ?  09/26/2021  ?  2:58 PM 08/22/2021  ? 11:00 AM  ?Readmission Risk Prevention Plan  ?Transportation Screening Complete Complete  ?PCP or Specialist Appt within 3-5 Days  Not Complete  ?[place] or Home Care Consult Complete Complete  ?Social Work Consult for [place] Planning/Counseling Complete Complete  ?Palliative Care Screening Not Applicable Not Applicable  ?Medication Review Press photographer) Complete Complete  ? ? ? ? ? ?

## 2021-09-26 NOTE — TOC Initial Note (Signed)
Transition of Care (TOC) - Initial/Assessment Note  ? ? ?Patient Details  ?Name: Darrell Lang ?MRN: [id number] ?Date of Birth: 02-11-1946 ? ?Transition of Care (TOC) CM/SW Contact:    ?Salome Arnt, LCSW ?Phone Number: ?09/26/2021, 3:01 PM ? ?Clinical Narrative:  Pt admitted with acute CHF. Assessment completed due to high risk readmission score. Pt reports he lives with his wife and is independent with ADLs. He drives himself to appointments. Pt is followed by [facility]. [place] notification completed. Notification ID: [id number]. Pt plans to return home when medically stable. He indicates he is not currently active with home health services. TOC will continue to follow.                ? ? ?Expected Discharge Plan: Home/Self Care ?Barriers to Discharge: Continued Medical Work up ? ? ?Patient Goals and CMS Choice ?Patient states their goals for this hospitalization and ongoing recovery are:: return home ?  ?Choice offered to / list presented to : Patient ? ?Expected Discharge Plan and Services ?Expected Discharge Plan: Home/Self Care ?In-house Referral: Clinical Social Work ?  ?  ?Living arrangements for the past 2 months: [place] ?                ?  ?  ?  ?  ?  ?  ?  ?  ?  ?  ? ?Prior Living Arrangements/Services ?Living arrangements for the past 2 months: [place] ?Lives with:: Spouse ?Patient language and need for interpreter reviewed:: Yes ?Do you feel safe going back to the place where you live?: Yes      ?Need for Family Participation in Patient Care: No (Comment) ?  ?  ?Criminal Activity/Legal Involvement Pertinent to Current Situation/Hospitalization: No - Comment as needed ? ?Activities of Daily Living ?Home Assistive Devices/Equipment: Eyeglasses ?ADL Screening (condition at time of admission) ?Patient's cognitive ability adequate to safely complete daily activities?: Yes ?Is the patient deaf or have difficulty hearing?: Yes ?Does the patient have difficulty  seeing, even when wearing glasses/contacts?: No ?Does the patient have difficulty concentrating, remembering, or making decisions?: No ?Patient able to express need for assistance with ADLs?: Yes ?Does the patient have difficulty dressing or bathing?: No ?Independently performs ADLs?: Yes (appropriate for developmental age) ?Does the patient have difficulty walking or climbing stairs?: Yes ?Weakness of Legs: Both ?Weakness of Arms/Hands: None ? ?Permission Sought/Granted ?  ?  ?   ?   ?   ?   ? ?Emotional Assessment ?  ?  ?Affect (typically observed): Appropriate ?Orientation: : Oriented to Self, Oriented to Place, Oriented to Situation, Oriented to  Time ?Alcohol / Substance Use: Not Applicable ?Psych Involvement: No (comment) ? ?Admission diagnosis:  Hyponatremia [E87.1] ?Acute CHF (congestive heart failure) (HCC) [I50.9] ?Anemia, unspecified type [D64.9] ?Acute on chronic congestive heart failure, unspecified heart failure type ([place]) [I50.9] ?Acute diastolic CHF (congestive heart failure) ([place]) [I50.31] ?Patient Active Problem List  ? Diagnosis Date Noted  ? Acute diastolic CHF (congestive heart failure) ([place]) 09/25/2021  ? Acute CHF (congestive heart failure) ([place]) 09/24/2021  ? Enlarged prostate with urinary obstruction 09/24/2021  ? Acute respiratory failure with hypoxia ([place]) 09/24/2021  ? Hyponatremia 09/24/2021  ? Sepsis ([place]) 08/19/2021  ? Sepsis secondary to UTI ([place]) 08/19/2021  ? Acute lower respiratory infection 08/19/2021  ? Atrial fibrillation, chronic ([place]) 08/19/2021  ? Normocytic anemia 06/23/2021  ? Heme positive stool 06/23/2021  ? Hyperthyroidism 03/11/2019  ? COVID-19 virus infection 02/09/2019  ?  Essential hypertension 02/09/2019  ? Hyperlipidemia 02/09/2019  ? Diabetes ([place]) 02/09/2019  ? Atrial fibrillation with RVR ([place]) 02/08/2019  ? Encounter for screening colonoscopy 07/28/2013  ? ?PCP:  [place]:   ?[id number] [place] [address] [address]?Phone: [phone]: [phone]? ? ? ? ?Social Determinants of Health (SDOH) Interventions ?  ? ?Readmission Risk Interventions ? ?  09/26/2021  ?  2:58 PM 08/22/2021  ? 11:00 AM  ?Readmission Risk Prevention Plan  ?Transportation Screening Complete Complete  ?PCP or Specialist Appt within 3-5 Days  Not Complete  ?[facility] Consult Complete Complete  ?Social Work Consult for [place]/Counseling Complete Complete  ?Palliative Care Screening Not Applicable Not Applicable  ?Medication Review (Press photographer Complete Complete  ? ? ? ?

## 2021-09-26 NOTE — Discharge Summary (Signed)
Physician Discharge Summary  ?Darrell Lang BHA:[id number] DOB: 18-Jun-1945 DOA: 09/24/2021 ? ?PCP: [facility] ? ?Admit date: 09/24/2021 ?Discharge date: 09/26/2021 ? ?Admitted From: Home ?Disposition: Home ? ?Recommendations for Outpatient Follow-up:  ?Follow up with PCP in 1-2 weeks ?Please obtain BMP/CBC in one week ?Follow-up with primary cardiologist at the [facility] in 2 weeks ?Follow-up rash and right flank with PCP ? ?Home Health: Home health RN ?Equipment/Devices: ? ?Discharge Condition: Stable ?CODE STATUS: Full code ?Diet recommendation: Heart healthy ? ?Brief/Interim Summary: ?76 year old male with a history of atrial fibrillation, enlarged prostate with bladder outlet obstruction, diabetes, hyperthyroidism.  He is admitted to the hospital with progressive shortness of breath and volume overload.  Found to have decompensated CHF and admitted for IV diuretics ? ?Discharge Diagnoses:  ?Principal Problem: ?  Acute CHF (congestive heart failure) ([place]) ?Active Problems: ?  Essential hypertension ?  Hyperlipidemia ?  Diabetes ([place]) ?  Hyperthyroidism ?  Atrial fibrillation, chronic ([place]) ?  Enlarged prostate with urinary obstruction ?  Acute respiratory failure with hypoxia ([place]) ?  Hyponatremia ?  Acute diastolic CHF (congestive heart failure) ([place]) ? ?Acute diastolic congestive heart failure ?-Echo from 08/2021 shows EF of 60 to 65% ?-BNP 315 ?-Clinically, he initially had evidence of volume overload ?-Treated with IV Lasix with good urine output ?-Overall volume status has improved and he has been transitioned to oral Lasix ?-Currently on metoprolol and pressures have been normotensive ?-Can consider restarting ARB when follows up with primary cardiologist ?  ?Acute respiratory failure with hypoxia ?-Noted to have saturations of 87% on room air on admission ?-On admission, he was also noted to be short of breath and wheezing ?-He was placed on supplemental oxygen with improvement in saturations ?-As patient  diuresis, he was able to wean off of oxygen and is currently on room air.  Ambulating in the hall without shortness of breath or hypoxia ?  ?Chronic atrial fibrillation ?-CHA2DS2-VASc 4 ?-Heart rate currently stable ?-Continue on metoprolol ?-We will hold diltiazem since pressures currently normotensive and heart rate is stable ?-Anticoagulated with Eliquis ?  ?Hyponatremia ?-Appeared to be hypervolemic hyponatremia ?-Continue fluid restriction and diuresis ?-Urine and serum sodium are low ?-Overall sodium has improved with diuresis ?  ?BPH with bladder outlet obstruction ?-Patient self caths at home ?-We will continue intermittent self catheterizations ?-Continue Flomax and finasteride ?  ?Hyperthyroidism ?-Continue on methimazole ?  ?Diabetes ?-Holding oral agents ?-Continue on sliding scale insulin ?-Home medications can be resumed on discharge ?  ?Hypertension ?-On losartan, metoprolol, diltiazem ?-Continued on metoprolol for now.  Remainder antihypertensive agents can be resumed in the outpatient setting as blood pressure will allow.  Currently he is normotensive ?  ?Rash ?-Noted to have rash on right flank ?-Unclear etiology ?-Unlikely allergic reaction ?-This area is not painful and he does not have any evidence of vesicles ?-This can be followed up by PCP ?  ?Hypokalemia ?-Replace ?-Check magnesium ? ?Abdominal distention ?-Patient reports it was present since last admission ?-Did report recently having some constipation ?-Bladder with catheterized and decompressed ?-Also received enema for constipation ? ?Discharge Instructions ? ?Discharge Instructions   ? ? Diet - low sodium heart healthy   Complete by: As directed ?  ? Increase activity slowly   Complete by: As directed ?  ? ?  ? ?Allergies as of 09/26/2021   ? ?   Reactions  ? Empagliflozin Other (See Comments)  ? Other reaction(s): Urinary tract infectious disease  ? Haldol [haloperidol] Other (  See Comments)  ? "Messed up his thoughts" pt takes  risperidone   ? Lisinopril Cough  ? Sulfamethoxazole-trimethoprim Other (See Comments)  ? Unknown reaction  ? ?  ? ?  ?Medication List  ?  ? ?STOP taking these medications   ? ?amLODipine 5 MG tablet ?Commonly known as: NORVASC ?  ?diltiazem 180 MG 24 hr capsule ?Commonly known as: CARDIZEM CD ?  ?fluticasone 50 MCG/ACT nasal spray ?Commonly known as: FLONASE ?  ?losartan 100 MG tablet ?Commonly known as: COZAAR ?  ?ZINC PO ?  ? ?  ? ?TAKE these medications   ? ?apixaban 5 MG Tabs tablet ?Commonly known as: ELIQUIS ?Take 1 tablet (5 mg total) by mouth 2 (two) times daily. ?  ?atorvastatin 10 MG tablet ?Commonly known as: LIPITOR ?Take 10 mg by mouth daily. ?  ?benztropine 1 MG tablet ?Commonly known as: COGENTIN ?Take 1 mg by mouth at bedtime. ?  ?busPIRone 5 MG tablet ?Commonly known as: BUSPAR ?Take 2.5 mg by mouth daily. ?  ?Carboxymethylcellulose Sodium 1 % Gel ?Place 1 drop into both eyes as needed (eye glued together). ?  ?carboxymethylcellulose 0.5 % Soln ?Commonly known as: REFRESH PLUS ?Place 1 drop into both eyes 2 (two) times daily as needed (dry eye). ?  ?Cholecalciferol 50 MCG (2000 UT) Tabs ?Take 2,000 Units by mouth 3 (three) times a week. ?  ?citalopram 20 MG tablet ?Commonly known as: CELEXA ?Take 20 mg by mouth daily. ?  ?finasteride 5 MG tablet ?Commonly known as: PROSCAR ?Take 5 mg by mouth daily. ?  ?furosemide 40 MG tablet ?Commonly known as: Lasix ?Take 1 tablet (40 mg total) by mouth daily. ?  ?gabapentin 300 MG capsule ?Commonly known as: NEURONTIN ?Take 300 mg by mouth 2 (two) times daily. ?  ?glipiZIDE 5 MG tablet ?Commonly known as: GLUCOTROL ?Take 2.5-5 mg by mouth See admin instructions. Take 5 mg in the morning and 2.5 mg every evening ?  ?glucose-Vitamin C 4-0.006 GM Chew chewable tablet ?Chew 1 tablet by mouth as needed for low blood sugar. ?  ?Magnesium Oxide 420 MG Tabs ?Take 420 mg by mouth daily. ?  ?metFORMIN 1000 MG tablet ?Commonly known as: GLUCOPHAGE ?Take 1,000-1,500 mg by  mouth See admin instructions. Take 1000 mg by mouth in the morning and take 1500 mg in the evening ?  ?methimazole 5 MG tablet ?Commonly known as: TAPAZOLE ?Take 1 tablet (5 mg total) by mouth daily. Hyperthyroidism: E05.90 ?  ?metoprolol tartrate 25 MG tablet ?Commonly known as: LOPRESSOR ?Take 1 tablet (25 mg total) by mouth 2 (two) times daily. ?What changed: Another medication with the same name was removed. Continue taking this medication, and follow the directions you see here. ?  ?multivitamin with minerals Tabs tablet ?Take 1 tablet by mouth daily. High potency ?  ?omeprazole 20 MG capsule ?Commonly known as: PRILOSEC ?Take 20 mg by mouth daily. ?  ?potassium chloride SA 20 MEQ tablet ?Commonly known as: KLOR-CON M ?Take 1 tablet (20 mEq total) by mouth daily. ?  ?QUEtiapine 50 MG tablet ?Commonly known as: SEROQUEL ?Take 50 mg by mouth at bedtime. ?  ?risperiDONE 1 MG tablet ?Commonly known as: RISPERDAL ?Take 1 mg by mouth at bedtime. ?  ?Semaglutide(0.25 or 0.'5MG'$ /DOS) 2 MG/1.5ML Sopn ?Inject 0.5 mg into the skin every Monday. ?  ?tamsulosin 0.4 MG Caps capsule ?Commonly known as: FLOMAX ?Take 0.8 mg by mouth at bedtime. ?  ?vitamin B-12 500 MCG tablet ?Commonly known as: CYANOCOBALAMIN ?Take  500 mcg by mouth in the morning and at bedtime. ?  ? ?  ? ? Follow-up Information   ? ? [facility].   ?Specialty: Home Health Services ?Why: Will contact you to schedule home health visits. ? ?  ?  ? ?  ?  ? ?  ? ?Allergies  ?Allergen Reactions  ? Empagliflozin Other (See Comments)  ?  Other reaction(s): Urinary tract infectious disease  ? Haldol [Haloperidol] Other (See Comments)  ?  "Messed up his thoughts" pt takes risperidone   ? Lisinopril Cough  ? Sulfamethoxazole-Trimethoprim Other (See Comments)  ?  Unknown reaction  ? ? ?Consultations: ? ? ? ?Procedures/Studies: ?DG Chest Portable 1 View ? ?Result Date: 09/24/2021 ?CLINICAL DATA:  dyspnea EXAM: PORTABLE CHEST 1 VIEW COMPARISON:   Chest radiograph dated August 21, 2021 FINDINGS: The cardiomediastinal silhouette is unchanged in contour.Biapical scarring. Favored trace bilateral pleural effusions pleural effusion. No pneumothorax. Increased

## 2021-09-27 LAB — GLUCOSE, CAPILLARY
Glucose-Capillary: 112 mg/dL — ABNORMAL HIGH (ref 70–99)
Glucose-Capillary: 157 mg/dL — ABNORMAL HIGH (ref 70–99)

## 2021-09-27 LAB — MAGNESIUM: Magnesium: 2.2 mg/dL (ref 1.7–2.4)

## 2021-09-27 LAB — POTASSIUM: Potassium: 3.4 mmol/L — ABNORMAL LOW (ref 3.5–5.1)

## 2021-09-27 NOTE — Progress Notes (Signed)
Nsg Discharge Note ? ?Admit Date:  09/24/2021 ?Discharge date: 09/27/2021 ?  ?Humberto Leep to be D/C'd Home per MD order.  AVS completed.  Copy for chart, and copy for patient signed, and dated. ?Patient/caregiver able to verbalize understanding. Nitro patch was removed  ? ? ?Discharge Medication: ?Allergies as of 09/27/2021   ? ?   Reactions  ? Empagliflozin Other (See Comments)  ? Other reaction(s): Urinary tract infectious disease  ? Haldol [haloperidol] Other (See Comments)  ? "Messed up his thoughts" pt takes risperidone   ? Lisinopril Cough  ? Sulfamethoxazole-trimethoprim Other (See Comments)  ? Unknown reaction  ? ?  ? ?  ?Medication List  ?  ? ?STOP taking these medications   ? ?amLODipine 5 MG tablet ?Commonly known as: NORVASC ?  ?diltiazem 180 MG 24 hr capsule ?Commonly known as: CARDIZEM CD ?  ?fluticasone 50 MCG/ACT nasal spray ?Commonly known as: FLONASE ?  ?losartan 100 MG tablet ?Commonly known as: COZAAR ?  ?ZINC PO ?  ? ?  ? ?TAKE these medications   ? ?apixaban 5 MG Tabs tablet ?Commonly known as: ELIQUIS ?Take 1 tablet (5 mg total) by mouth 2 (two) times daily. ?  ?atorvastatin 10 MG tablet ?Commonly known as: LIPITOR ?Take 10 mg by mouth daily. ?  ?benztropine 1 MG tablet ?Commonly known as: COGENTIN ?Take 1 mg by mouth at bedtime. ?  ?busPIRone 5 MG tablet ?Commonly known as: BUSPAR ?Take 2.5 mg by mouth daily. ?  ?Carboxymethylcellulose Sodium 1 % Gel ?Place 1 drop into both eyes as needed (eye glued together). ?  ?carboxymethylcellulose 0.5 % Soln ?Commonly known as: REFRESH PLUS ?Place 1 drop into both eyes 2 (two) times daily as needed (dry eye). ?  ?Cholecalciferol 50 MCG (2000 UT) Tabs ?Take 2,000 Units by mouth 3 (three) times a week. ?  ?citalopram 20 MG tablet ?Commonly known as: CELEXA ?Take 20 mg by mouth daily. ?  ?finasteride 5 MG tablet ?Commonly known as: PROSCAR ?Take 5 mg by mouth daily. ?  ?furosemide 40 MG tablet ?Commonly known as: Lasix ?Take 1 tablet (40 mg total) by  mouth daily. ?  ?gabapentin 300 MG capsule ?Commonly known as: NEURONTIN ?Take 300 mg by mouth 2 (two) times daily. ?  ?glipiZIDE 5 MG tablet ?Commonly known as: GLUCOTROL ?Take 2.5-5 mg by mouth See admin instructions. Take 5 mg in the morning and 2.5 mg every evening ?  ?glucose-Vitamin C 4-0.006 GM Chew chewable tablet ?Chew 1 tablet by mouth as needed for low blood sugar. ?  ?Magnesium Oxide 420 MG Tabs ?Take 420 mg by mouth daily. ?  ?metFORMIN 1000 MG tablet ?Commonly known as: GLUCOPHAGE ?Take 1,000-1,500 mg by mouth See admin instructions. Take 1000 mg by mouth in the morning and take 1500 mg in the evening ?  ?methimazole 5 MG tablet ?Commonly known as: TAPAZOLE ?Take 1 tablet (5 mg total) by mouth daily. Hyperthyroidism: E05.90 ?  ?metoprolol tartrate 25 MG tablet ?Commonly known as: LOPRESSOR ?Take 1 tablet (25 mg total) by mouth 2 (two) times daily. ?What changed: Another medication with the same name was removed. Continue taking this medication, and follow the directions you see here. ?  ?multivitamin with minerals Tabs tablet ?Take 1 tablet by mouth daily. High potency ?  ?omeprazole 20 MG capsule ?Commonly known as: PRILOSEC ?Take 20 mg by mouth daily. ?  ?potassium chloride SA 20 MEQ tablet ?Commonly known as: KLOR-CON M ?Take 1 tablet (20 mEq total) by mouth daily. ?  ?  QUEtiapine 50 MG tablet ?Commonly known as: SEROQUEL ?Take 50 mg by mouth at bedtime. ?  ?risperiDONE 1 MG tablet ?Commonly known as: RISPERDAL ?Take 1 mg by mouth at bedtime. ?  ?Semaglutide(0.25 or 0.'5MG'$ /DOS) 2 MG/1.5ML Sopn ?Inject 0.5 mg into the skin every Monday. ?  ?tamsulosin 0.4 MG Caps capsule ?Commonly known as: FLOMAX ?Take 0.8 mg by mouth at bedtime. ?  ?vitamin B-12 500 MCG tablet ?Commonly known as: CYANOCOBALAMIN ?Take 500 mcg by mouth in the morning and at bedtime. ?  ? ?  ? ? ?Discharge Assessment: ?Vitals:  ? 09/27/21 0351 09/27/21 1004  ?BP: (!) 108/50 127/66  ?Pulse: 66 96  ?Resp: 18 16  ?Temp: 97.7 ?F (36.5 ?C)    ?SpO2: 95% 97%  ? Skin clean, dry and intact without evidence of skin break down, no evidence of skin tears noted. ?IV catheter discontinued intact. Site without signs and symptoms of complications - no redness or edema noted at insertion site, patient denies c/o pain - only slight tenderness at site.  Dressing with slight pressure applied. ? ?D/c Instructions-Education: ?Discharge instructions given to patient/family with verbalized understanding. ?D/c education completed with patient/family including follow up instructions, medication list, d/c activities limitations if indicated, with other d/c instructions as indicated by MD - patient able to verbalize understanding, all questions fully answered. ?Patient instructed to return to ED, call 911, or call MD for any changes in condition.  ?Patient escorted via [place], and D/C home via private auto. ? ?Clovis Fredrickson, LPN ?3/81/8299 37:16 AM  ?

## 2021-09-27 NOTE — Progress Notes (Signed)
Although patient was discharged today, ended up staying since he reports he did not have a bowel movement after enema.  On evaluation today, he says he feels well.  Denies any shortness of breath.  Abdomen does not appear significantly distended as it did earlier in the hospitalization.  He feels he does not have much stool to pass since he did have a bowel movement the other day.  Does not feel uncomfortable from abdominal distention.  Plan will be to discharge home today.  Medications will be unchanged from discharge summary yesterday.  He needs to follow-up with his primary cardiologist in the next 1 to 2 weeks.  He is otherwise stable for discharge. ? ?Kathie Dike ? ?

## 2021-09-27 NOTE — TOC Transition Note (Signed)
Transition of Care (TOC) - CM/SW Discharge Note ? ? ?Patient Details  ?Name: Darrell Lang ?MRN: [id number] ?Date of Birth: 28-Nov-1945 ? ?Transition of Care (TOC) CM/SW Contact:  ?Boneta Lucks, RN ?Phone Number: ?09/27/2021, 8:30 AM ? ? ?Clinical Narrative:   [facility]. Orders have been placed. Vaughan Basta updated with discharge.  ? ? ?Final next level of care: [place] ?Barriers to Discharge: Barriers Resolved ? ? ?Patient Goals and CMS Choice ?Patient states their goals for this hospitalization and ongoing recovery are:: return home ?  ?Choice offered to / list presented to : Patient ? ?Discharge Placement ?  ?            ?Patient and family notified of of transfer: 09/26/21 ? ?Discharge Plan and Services ?In-house Referral: Clinical Social Work ? HH Arranged: RN ?[place] Agency: Microbiologist ([place]) ?Date HH Agency Contacted: 09/26/21 ?Time [place]: [id number] ?Representative spoke with at [place]: Vaughan Basta ? ?Readmission Risk Interventions ? ?  09/26/2021  ?  2:58 PM 08/22/2021  ? 11:00 AM  ?Readmission Risk Prevention Plan  ?Transportation Screening Complete Complete  ?PCP or Specialist Appt within 3-5 Days  Not Complete  ?[place] or Home Care Consult Complete Complete  ?Social Work Consult for [place] Planning/Counseling Complete Complete  ?Palliative Care Screening Not Applicable Not Applicable  ?Medication Review Press photographer) Complete Complete  ? ? ? ? ? ?

## 2021-09-30 ENCOUNTER — Ambulatory Visit: Payer: No Typology Code available for payment source | Admitting: Gastroenterology

## 2021-10-06 ENCOUNTER — Encounter (HOSPITAL_COMMUNITY): Payer: Self-pay | Admitting: Emergency Medicine

## 2021-10-06 ENCOUNTER — Emergency Department (HOSPITAL_COMMUNITY): Payer: No Typology Code available for payment source

## 2021-10-06 ENCOUNTER — Emergency Department (HOSPITAL_COMMUNITY)
Admission: EM | Admit: 2021-10-06 | Discharge: 2021-10-06 | Disposition: A | Discharge status: Home / Self Care | Payer: No Typology Code available for payment source | Attending: Emergency Medicine | Admitting: Emergency Medicine

## 2021-10-06 ENCOUNTER — Other Ambulatory Visit: Payer: Self-pay

## 2021-10-06 DIAGNOSIS — Z7984 Long term (current) use of oral hypoglycemic drugs: Secondary | ICD-10-CM | POA: Insufficient documentation

## 2021-10-06 DIAGNOSIS — E119 Type 2 diabetes mellitus without complications: Secondary | ICD-10-CM | POA: Diagnosis not present

## 2021-10-06 DIAGNOSIS — I509 Heart failure, unspecified: Secondary | ICD-10-CM | POA: Diagnosis not present

## 2021-10-06 DIAGNOSIS — R42 Dizziness and giddiness: Secondary | ICD-10-CM | POA: Diagnosis not present

## 2021-10-06 DIAGNOSIS — I110 Hypertensive heart disease with heart failure: Secondary | ICD-10-CM | POA: Diagnosis not present

## 2021-10-06 DIAGNOSIS — E039 Hypothyroidism, unspecified: Secondary | ICD-10-CM | POA: Diagnosis not present

## 2021-10-06 DIAGNOSIS — E875 Hyperkalemia: Secondary | ICD-10-CM | POA: Diagnosis not present

## 2021-10-06 DIAGNOSIS — N390 Urinary tract infection, site not specified: Secondary | ICD-10-CM | POA: Insufficient documentation

## 2021-10-06 DIAGNOSIS — R55 Syncope and collapse: Secondary | ICD-10-CM | POA: Insufficient documentation

## 2021-10-06 DIAGNOSIS — Z7901 Long term (current) use of anticoagulants: Secondary | ICD-10-CM | POA: Diagnosis not present

## 2021-10-06 LAB — BASIC METABOLIC PANEL
Anion gap: 5 (ref 5–15)
BUN: 16 mg/dL (ref 8–23)
CO2: 28 mmol/L (ref 22–32)
Calcium: 9.2 mg/dL (ref 8.9–10.3)
Chloride: 104 mmol/L (ref 98–111)
Creatinine, Ser: 1.06 mg/dL (ref 0.61–1.24)
GFR, Estimated: >60 mL/min (ref >60)
Glucose, Bld: 122 mg/dL — ABNORMAL HIGH (ref 70–99)
Potassium: 5.2 mmol/L — ABNORMAL HIGH (ref 3.5–5.1)
Sodium: 137 mmol/L (ref 135–145)

## 2021-10-06 LAB — TROPONIN I (HIGH SENSITIVITY)
Troponin I (High Sensitivity): 4 ng/L (ref <18)
Troponin I (High Sensitivity): 4 ng/L (ref <18)

## 2021-10-06 LAB — CBG MONITORING, ED: Glucose-Capillary: 108 mg/dL — ABNORMAL HIGH (ref 70–99)

## 2021-10-06 LAB — URINALYSIS, ROUTINE W REFLEX MICROSCOPIC
Bilirubin Urine: NEGATIVE
Glucose, UA: NEGATIVE mg/dL
Hgb urine dipstick: NEGATIVE
Ketones, ur: NEGATIVE mg/dL
Nitrite: POSITIVE — AB
Protein, ur: NEGATIVE mg/dL
Specific Gravity, Urine: 1.005 (ref 1.005–1.030)
pH: 7 (ref 5.0–8.0)

## 2021-10-06 LAB — CBC
HCT: 38.9 % — ABNORMAL LOW (ref 39.0–52.0)
Hemoglobin: 11.8 g/dL — ABNORMAL LOW (ref 13.0–17.0)
MCH: 25.6 pg — ABNORMAL LOW (ref 26.0–34.0)
MCHC: 30.3 g/dL (ref 30.0–36.0)
MCV: 84.4 fL (ref 80.0–100.0)
Platelets: 301 10*3/uL (ref 150–400)
RBC: 4.61 MIL/uL (ref 4.22–5.81)
RDW: 15.7 % — ABNORMAL HIGH (ref 11.5–15.5)
WBC: 7.7 10*3/uL (ref 4.0–10.5)
nRBC: 0 % (ref 0.0–0.2)

## 2021-10-06 LAB — BRAIN NATRIURETIC PEPTIDE: B Natriuretic Peptide: 268 pg/mL — ABNORMAL HIGH (ref 0.0–100.0)

## 2021-10-06 MED ORDER — SODIUM CHLORIDE 0.9 % IV BOLUS
500.0000 mL | Freq: Once | INTRAVENOUS | Status: AC
  Administered 2021-10-06: 500 mL via INTRAVENOUS

## 2021-10-06 MED ORDER — CIPROFLOXACIN HCL 500 MG PO TABS: 500.0000 mg | ORAL_TABLET | Freq: Two times a day (BID) | ORAL | 0 refills | Status: DC

## 2021-10-06 NOTE — ED Triage Notes (Signed)
Today pt almost passed out; got dizzy. Called EMS- brought in by EMS. No pain. Hx of CHF, a-fib, HTN- was just taken off of 3 BP meds in April.  orthostatics performed by EMS Lying- 177/71  hr-65 Sitting- 130/73 hr 69 Standing- 115/67 hr 76

## 2021-10-06 NOTE — ED Notes (Signed)
While ambulating pt pulse stayed between 90-95 and SpO2 stayed around 95 on room air. Pt stated he felt fine.

## 2021-10-06 NOTE — ED Provider Notes (Signed)
[facility] EMERGENCY DEPARTMENT Provider Note   CSN: [id number] Arrival date & time: 10/06/21  1313     History  Chief Complaint  Patient presents with   Near Syncope    Darrell Lang is a 76 y.o. male.  Patient brought in to the emergency department via EMS complaining of a near syncopal episode.  Patient states that over the past week with activity he has felt somewhat lightheaded.  Today he states it was a worsening of been and his wife helped him back to bed.  The patient has a history of CHF, atrial fibrillation, and hypertension.  His medications were altered in April of this year.  He was admitted in May with a new diagnosis of acute CHF.  He was discharged home on Lasix and continued on metoprolol with recommendation to follow-up with primary cardiologist to consider restarting ARB if blood pressures remained stable.  He also had acute respiratory failure with hypoxia with room air oxygen saturations of 87%.  Today he does not complain of shortness of breath.  His complaint is lightheadedness.  He denies dizziness, chest pain, abdominal pain, shortness of breath.  Past medical history significant for A-fib with RVR, hypertension, hyperlipidemia, diabetes, hypothyroidism, normocytic anemia, history of sepsis, acute CHF  HPI     Home Medications Prior to Admission medications   Medication Sig Start Date End Date Taking? Authorizing Provider  ciprofloxacin (CIPRO) 500 MG tablet Take 1 tablet (500 mg total) by mouth every 12 (twelve) hours for 10 days. 10/06/21 10/16/21 Yes Dorothyann Peng, PA-C  apixaban (ELIQUIS) 5 MG TABS tablet Take 1 tablet (5 mg total) by mouth 2 (two) times daily. 02/24/19   Nahser, Wonda Cheng, MD  atorvastatin (LIPITOR) 10 MG tablet Take 10 mg by mouth daily. 06/21/20   [provider]  benztropine (COGENTIN) 1 MG tablet Take 1 mg by mouth at bedtime.    [provider]  busPIRone (BUSPAR) 5 MG tablet Take 2.5 mg by mouth daily. 09/21/20    [provider]  carboxymethylcellulose (REFRESH PLUS) 0.5 % SOLN Place 1 drop into both eyes 2 (two) times daily as needed (dry eye). 04/11/21   [provider]  Carboxymethylcellulose Sodium 1 % GEL Place 1 drop into both eyes as needed (eye glued together). 12/09/20   [provider]  Cholecalciferol 50 MCG (2000 UT) TABS Take 2,000 Units by mouth 3 (three) times a week. 06/21/20   [provider]  citalopram (CELEXA) 20 MG tablet Take 20 mg by mouth daily.    [provider]  finasteride (PROSCAR) 5 MG tablet Take 5 mg by mouth daily. 06/21/20   [provider]  furosemide (LASIX) 40 MG tablet Take 1 tablet (40 mg total) by mouth daily. 09/26/21 09/26/22  Kathie Dike, MD  gabapentin (NEURONTIN) 300 MG capsule Take 300 mg by mouth 2 (two) times daily. 05/11/20   [provider]  glipiZIDE (GLUCOTROL) 5 MG tablet Take 2.5-5 mg by mouth See admin instructions. Take 5 mg in the morning and 2.5 mg every evening    [provider]  glucose-Vitamin C 4-0.006 GM CHEW chewable tablet Chew 1 tablet by mouth as needed for low blood sugar. Patient not taking: Reported on 09/25/2021 09/30/20   [provider]  Magnesium Oxide 420 MG TABS Take 420 mg by mouth daily. 12/19/20   [provider]  metFORMIN (GLUCOPHAGE) 1000 MG tablet Take 1,000-1,500 mg by mouth See admin instructions. Take 1000 mg by mouth  in the morning and take 1500 mg in the evening    [provider]  methimazole (TAPAZOLE) 5 MG tablet Take 1 tablet (5 mg total) by mouth daily. Hyperthyroidism: E05.90 07/01/19   Renato Shin, MD  metoprolol tartrate (LOPRESSOR) 25 MG tablet Take 1 tablet (25 mg total) by mouth 2 (two) times daily. Patient not taking: Reported on 09/25/2021 03/06/19   Nahser, Wonda Cheng, MD  Multiple Vitamin (MULTIVITAMIN WITH MINERALS) TABS tablet Take 1 tablet by mouth daily. High potency    [provider]  omeprazole  (PRILOSEC) 20 MG capsule Take 20 mg by mouth daily.    [provider]  potassium chloride (KLOR-CON M) 20 MEQ tablet Take 1 tablet (20 mEq total) by mouth daily. 09/26/21   Kathie Dike, MD  QUEtiapine (SEROQUEL) 50 MG tablet Take 50 mg by mouth at bedtime.    [provider]  risperiDONE (RISPERDAL) 1 MG tablet Take 1 mg by mouth at bedtime.    [provider]  Semaglutide,0.25 or 0.'5MG'$ /DOS, 2 MG/1.5ML SOPN Inject 0.5 mg into the skin every Monday. 05/03/21   [provider]  tamsulosin (FLOMAX) 0.4 MG CAPS capsule Take 0.8 mg by mouth at bedtime. 10/28/19   [provider]  vitamin B-12 (CYANOCOBALAMIN) 500 MCG tablet Take 500 mcg by mouth in the morning and at bedtime. 06/21/20   [provider]      Allergies    Empagliflozin, Haldol [haloperidol], Lisinopril, and Sulfamethoxazole-trimethoprim    Review of Systems   Review of Systems  Constitutional:  Positive for fatigue. Negative for fever.  Respiratory:  Negative for shortness of breath.   Cardiovascular:  Negative for chest pain and leg swelling.  Gastrointestinal:  Negative for abdominal pain, nausea and vomiting.  Neurological:  Positive for light-headedness.   Physical Exam Updated Vital Signs BP 132/70   Pulse 70   Temp 97.6 F (36.4 C) (Oral)   Resp 19   Ht '5\' 11"'$  (1.803 m)   Wt 78.6 kg   SpO2 98%   BMI 24.17 kg/m  Physical Exam Vitals and nursing note reviewed.  Constitutional:      General: He is not in acute distress. HENT:     Head: Normocephalic and atraumatic.     Mouth/Throat:     Mouth: Mucous membranes are moist.  Eyes:     Conjunctiva/sclera: Conjunctivae normal.  Cardiovascular:     Rate and Rhythm: Normal rate. Rhythm irregular.     Pulses: Normal pulses.  Pulmonary:     Effort: Pulmonary effort is normal.     Breath sounds: Normal breath sounds.  Abdominal:     Palpations: Abdomen is soft.  Musculoskeletal:     Cervical back: Normal  range of motion and neck supple.     Right lower leg: No edema.     Left lower leg: No edema.  Skin:    General: Skin is warm and dry.     Capillary Refill: Capillary refill takes less than 2 seconds.  Neurological:     General: No focal deficit present.     Mental Status: He is alert.    ED Results / Procedures / Treatments   Labs (all labs ordered are listed, but only abnormal results are displayed) Labs Reviewed  BASIC METABOLIC PANEL - Abnormal; Notable for the following components:      Result Value   Potassium 5.2 (*)    Glucose, Bld 122 (*)    All other components within normal limits  CBC - Abnormal; Notable for the following components:   Hemoglobin 11.8 (*)    HCT 38.9 (*)    MCH 25.6 (*)    RDW 15.7 (*)    All other components within normal limits  URINALYSIS, ROUTINE W REFLEX MICROSCOPIC - Abnormal; Notable for the following components:   APPearance HAZY (*)    Nitrite POSITIVE (*)    Leukocytes,Ua LARGE (*)    Bacteria, UA FEW (*)    All other components within normal limits  BRAIN NATRIURETIC PEPTIDE - Abnormal; Notable for the following components:   B Natriuretic Peptide 268.0 (*)    All other components within normal limits  CBG MONITORING, ED - Abnormal; Notable for the following components:   Glucose-Capillary 108 (*)    All other components within normal limits  URINE CULTURE  TROPONIN I (HIGH SENSITIVITY)  TROPONIN I (HIGH SENSITIVITY)    EKG EKG Interpretation  Date/Time:  Thursday Oct 06 2021 13:53:06 EDT Ventricular Rate:  63 PR Interval:    QRS Duration: 74 QT Interval:  415 QTC Calculation: 425 R Axis:   33 Text Interpretation: Atrial fibrillation Low voltage, extremity leads Confirmed by Octaviano Glow [phone]) on 10/06/2021 2:09:12 PM  Radiology DG Chest 2 View  Result Date: 10/06/2021 CLINICAL DATA:  Weakness and abnormal breath sounds. EXAM: CHEST - 2 VIEW COMPARISON:  09/24/2021 and prior studies FINDINGS: The cardiomediastinal  silhouette is unremarkable. There is no evidence of focal airspace disease, pulmonary edema, suspicious pulmonary nodule/mass, pleural effusion, or pneumothorax. No acute bony abnormalities are identified. IMPRESSION: No active cardiopulmonary disease. Electronically Signed   By: Margarette Canada M.D.   On: 10/06/2021 15:17    Procedures Procedures    Medications Ordered in ED Medications  sodium chloride 0.9 % bolus 500 mL (0 mLs Intravenous Stopped 10/06/21 1746)    ED Course/ Medical Decision Making/ A&P                           Medical Decision Making Amount and/or Complexity of Data Reviewed Labs: ordered. Radiology: ordered.  Risk Prescription drug management.   This patient presents to the ED for concern of generalized weakness, this involves an extensive number of treatment options, and is a complaint that carries with it a high risk of complications and morbidity.  The differential diagnosis includes ACS, worsening heart failure, intracranial hemorrhage, seizure, stroke, and others   Co morbidities that complicate the patient evaluation  History of A-fib, history of CHF   Additional history obtained:  Additional history obtained from patient's wife and EMS External records from outside source obtained and reviewed including discharge summary from May of this year detailing recent hospitalization   Lab Tests:  I Ordered, and personally interpreted labs.  The pertinent results include: Urinalysis positive for nitrite, large leukocytes, few bacteria.  BNP 268.  Troponin 4.  Hemoglobin 11.8.   Imaging Studies ordered:  I ordered imaging studies including chest x-ray I independently visualized and interpreted imaging which showed no acute abnormality I agree with the radiologist interpretation   Cardiac Monitoring: / EKG:  The patient was maintained on a cardiac monitor.  I personally viewed and interpreted the cardiac monitored which showed an underlying rhythm of:  Atrial fibrillation   Problem List / ED Course / Critical interventions / Medication management   I ordered medication including normal saline for dehydration Reevaluation of the patient after these medicines showed that the patient improved I have reviewed the  patients home medicines and have made adjustments as needed  Test / Admission - Considered:  There were no signs of ACS on work-up today.  His BNP is improved from the visit earlier in the month.  No signs of fluid overload clinically.  No pedal edema.  No head trauma, no neurodeficits.  No suspicion at this time for stroke or intracranial hemorrhage.  The patient does have a UTI based on lab work.  He does have to cath twice per day Lang to prostate issues.  It is possible that the UTI is causing some of his dizziness and lightheadedness.  I prescribed an antibiotic for the patient.  I discussed possible admission with the patient but he stated he would rather go home.  This seems reasonable.  He will follow-up with his primary care.  If he continues to have issues or if they worsen he will return to the hospital.  Final Clinical Impression(s) / ED Diagnoses Final diagnoses:  Near syncope  Urinary tract infection without hematuria, site unspecified  Hyperkalemia    Rx / DC Orders ED Discharge Orders          Ordered    ciprofloxacin (CIPRO) 500 MG tablet  Every 12 hours        10/06/21 2033              Ronny Bacon 10/06/21 2059    Wyvonnia Dusky, MD 10/07/21 [phone]

## 2021-10-06 NOTE — ED Notes (Signed)
Given graham crackers and ginger ale

## 2021-10-06 NOTE — Discharge Instructions (Addendum)
You were seen today for recent spells with lightheadedness.Your work-up shows signs of a urinary tract infection.  I have prescribed an antibiotic that she should take in its entirety.  Please continue to monitor your blood sugars as this antibiotic can interact with one of your diabetic medications potentially causing low blood sugar.  Follow-up with your primary care provider.  I also recommend following up with him for management of your heart failure.

## 2021-10-09 LAB — URINE CULTURE: Culture: >=100000 — AB

## 2021-10-14 ENCOUNTER — Other Ambulatory Visit: Payer: Self-pay

## 2021-10-14 ENCOUNTER — Emergency Department (HOSPITAL_COMMUNITY): Payer: No Typology Code available for payment source

## 2021-10-14 ENCOUNTER — Observation Stay (HOSPITAL_COMMUNITY)
Admission: EM | Admit: 2021-10-14 | Discharge: 2021-10-15 | Disposition: A | Discharge status: Home / Self Care | Payer: No Typology Code available for payment source | Attending: Family Medicine | Admitting: Family Medicine

## 2021-10-14 ENCOUNTER — Encounter (HOSPITAL_COMMUNITY): Payer: Self-pay | Admitting: *Deleted

## 2021-10-14 DIAGNOSIS — E119 Type 2 diabetes mellitus without complications: Secondary | ICD-10-CM | POA: Insufficient documentation

## 2021-10-14 DIAGNOSIS — E0590 Thyrotoxicosis, unspecified without thyrotoxic crisis or storm: Secondary | ICD-10-CM | POA: Diagnosis present

## 2021-10-14 DIAGNOSIS — Z7901 Long term (current) use of anticoagulants: Secondary | ICD-10-CM | POA: Insufficient documentation

## 2021-10-14 DIAGNOSIS — D649 Anemia, unspecified: Secondary | ICD-10-CM | POA: Insufficient documentation

## 2021-10-14 DIAGNOSIS — I951 Orthostatic hypotension: Secondary | ICD-10-CM | POA: Diagnosis not present

## 2021-10-14 DIAGNOSIS — Z7984 Long term (current) use of oral hypoglycemic drugs: Secondary | ICD-10-CM | POA: Insufficient documentation

## 2021-10-14 DIAGNOSIS — Z7985 Long-term (current) use of injectable non-insulin antidiabetic drugs: Secondary | ICD-10-CM | POA: Insufficient documentation

## 2021-10-14 DIAGNOSIS — Z79899 Other long term (current) drug therapy: Secondary | ICD-10-CM | POA: Insufficient documentation

## 2021-10-14 DIAGNOSIS — J449 Chronic obstructive pulmonary disease, unspecified: Secondary | ICD-10-CM | POA: Diagnosis not present

## 2021-10-14 DIAGNOSIS — I10 Essential (primary) hypertension: Secondary | ICD-10-CM | POA: Diagnosis present

## 2021-10-14 DIAGNOSIS — I5030 Unspecified diastolic (congestive) heart failure: Secondary | ICD-10-CM | POA: Insufficient documentation

## 2021-10-14 DIAGNOSIS — R55 Syncope and collapse: Secondary | ICD-10-CM | POA: Diagnosis present

## 2021-10-14 DIAGNOSIS — Z8616 Personal history of COVID-19: Secondary | ICD-10-CM | POA: Insufficient documentation

## 2021-10-14 DIAGNOSIS — I110 Hypertensive heart disease with heart failure: Secondary | ICD-10-CM | POA: Insufficient documentation

## 2021-10-14 DIAGNOSIS — I482 Chronic atrial fibrillation, unspecified: Secondary | ICD-10-CM | POA: Diagnosis not present

## 2021-10-14 DIAGNOSIS — Z87891 Personal history of nicotine dependence: Secondary | ICD-10-CM | POA: Diagnosis not present

## 2021-10-14 LAB — COMPREHENSIVE METABOLIC PANEL
ALT: 16 U/L (ref 0–44)
AST: 19 U/L (ref 15–41)
Albumin: 4.1 g/dL (ref 3.5–5.0)
Alkaline Phosphatase: 100 U/L (ref 38–126)
Anion gap: 7 (ref 5–15)
BUN: 16 mg/dL (ref 8–23)
CO2: 27 mmol/L (ref 22–32)
Calcium: 9.6 mg/dL (ref 8.9–10.3)
Chloride: 105 mmol/L (ref 98–111)
Creatinine, Ser: 1.05 mg/dL (ref 0.61–1.24)
GFR, Estimated: >60 mL/min (ref >60)
Glucose, Bld: 153 mg/dL — ABNORMAL HIGH (ref 70–99)
Potassium: 4.5 mmol/L (ref 3.5–5.1)
Sodium: 139 mmol/L (ref 135–145)
Total Bilirubin: 0.5 mg/dL (ref 0.3–1.2)
Total Protein: 7.8 g/dL (ref 6.5–8.1)

## 2021-10-14 LAB — CBC WITH DIFFERENTIAL/PLATELET
Abs Immature Granulocytes: 0.03 10*3/uL (ref 0.00–0.07)
Basophils Absolute: 0.1 10*3/uL (ref 0.0–0.1)
Basophils Relative: 1 %
Eosinophils Absolute: 0.1 10*3/uL (ref 0.0–0.5)
Eosinophils Relative: 1 %
HCT: 38.8 % — ABNORMAL LOW (ref 39.0–52.0)
Hemoglobin: 11.8 g/dL — ABNORMAL LOW (ref 13.0–17.0)
Immature Granulocytes: 0 %
Lymphocytes Relative: 10 %
Lymphs Abs: 1.1 10*3/uL (ref 0.7–4.0)
MCH: 25.8 pg — ABNORMAL LOW (ref 26.0–34.0)
MCHC: 30.4 g/dL (ref 30.0–36.0)
MCV: 84.9 fL (ref 80.0–100.0)
Monocytes Absolute: 0.5 10*3/uL (ref 0.1–1.0)
Monocytes Relative: 5 %
Neutro Abs: 8.4 10*3/uL — ABNORMAL HIGH (ref 1.7–7.7)
Neutrophils Relative %: 83 %
Platelets: 284 10*3/uL (ref 150–400)
RBC: 4.57 MIL/uL (ref 4.22–5.81)
RDW: 15.7 % — ABNORMAL HIGH (ref 11.5–15.5)
WBC: 10.2 10*3/uL (ref 4.0–10.5)
nRBC: 0 % (ref 0.0–0.2)

## 2021-10-14 LAB — GLUCOSE, CAPILLARY
Glucose-Capillary: 92 mg/dL (ref 70–99)
Glucose-Capillary: 224 mg/dL — ABNORMAL HIGH (ref 70–99)

## 2021-10-14 LAB — URINALYSIS, ROUTINE W REFLEX MICROSCOPIC
Bilirubin Urine: NEGATIVE
Glucose, UA: NEGATIVE mg/dL
Hgb urine dipstick: NEGATIVE
Ketones, ur: NEGATIVE mg/dL
Leukocytes,Ua: NEGATIVE
Nitrite: NEGATIVE
Protein, ur: NEGATIVE mg/dL
Specific Gravity, Urine: 1.009 (ref 1.005–1.030)
pH: 8 (ref 5.0–8.0)

## 2021-10-14 LAB — CBG MONITORING, ED: Glucose-Capillary: 146 mg/dL — ABNORMAL HIGH (ref 70–99)

## 2021-10-14 LAB — TROPONIN I (HIGH SENSITIVITY)
Troponin I (High Sensitivity): 8 ng/L (ref <18)
Troponin I (High Sensitivity): 7 ng/L (ref <18)

## 2021-10-14 MED ORDER — FINASTERIDE 5 MG PO TABS
5.0000 mg | ORAL_TABLET | Freq: Every day | ORAL | Status: DC
  Administered 2021-10-14 – 2021-10-15 (×2): 5 mg via ORAL
  Filled 2021-10-14 (×2): qty 1

## 2021-10-14 MED ORDER — GABAPENTIN 300 MG PO CAPS
300.0000 mg | ORAL_CAPSULE | Freq: Two times a day (BID) | ORAL | Status: DC
  Administered 2021-10-14 – 2021-10-15 (×2): 300 mg via ORAL
  Filled 2021-10-14 (×2): qty 1

## 2021-10-14 MED ORDER — INSULIN ASPART 100 UNIT/ML IJ SOLN
0.0000 [IU] | Freq: Every day | INTRAMUSCULAR | Status: DC
  Administered 2021-10-14: 2 [IU] via SUBCUTANEOUS

## 2021-10-14 MED ORDER — BENZTROPINE MESYLATE 1 MG PO TABS
1.0000 mg | ORAL_TABLET | Freq: Every day | ORAL | Status: DC
  Administered 2021-10-14: 1 mg via ORAL
  Filled 2021-10-14: qty 1

## 2021-10-14 MED ORDER — SODIUM CHLORIDE 0.9 % IV SOLN: 250.0000 mL | INTRAVENOUS | Status: DC | PRN

## 2021-10-14 MED ORDER — RISPERIDONE 1 MG PO TABS
1.0000 mg | ORAL_TABLET | Freq: Every day | ORAL | Status: DC
  Administered 2021-10-14: 1 mg via ORAL
  Filled 2021-10-14: qty 1

## 2021-10-14 MED ORDER — LACTATED RINGERS IV BOLUS
1000.0000 mL | Freq: Once | INTRAVENOUS | Status: AC
  Administered 2021-10-14: 1000 mL via INTRAVENOUS

## 2021-10-14 MED ORDER — POLYETHYLENE GLYCOL 3350 17 G PO PACK: 17.0000 g | PACK | Freq: Every day | ORAL | Status: DC | PRN

## 2021-10-14 MED ORDER — SODIUM CHLORIDE 0.9% FLUSH
3.0000 mL | Freq: Two times a day (BID) | INTRAVENOUS | Status: DC
  Administered 2021-10-14: 3 mL via INTRAVENOUS

## 2021-10-14 MED ORDER — INSULIN ASPART 100 UNIT/ML IJ SOLN: 0.0000 [IU] | Freq: Three times a day (TID) | INTRAMUSCULAR | Status: DC

## 2021-10-14 MED ORDER — CITALOPRAM HYDROBROMIDE 20 MG PO TABS
20.0000 mg | ORAL_TABLET | Freq: Every day | ORAL | Status: DC
  Administered 2021-10-14 – 2021-10-15 (×2): 20 mg via ORAL
  Filled 2021-10-14 (×2): qty 1

## 2021-10-14 MED ORDER — ONDANSETRON HCL 4 MG/2ML IJ SOLN: 4.0000 mg | Freq: Four times a day (QID) | INTRAMUSCULAR | Status: DC | PRN

## 2021-10-14 MED ORDER — ONDANSETRON HCL 4 MG PO TABS: 4.0000 mg | ORAL_TABLET | Freq: Four times a day (QID) | ORAL | Status: DC | PRN

## 2021-10-14 MED ORDER — ATORVASTATIN CALCIUM 10 MG PO TABS
10.0000 mg | ORAL_TABLET | Freq: Every day | ORAL | Status: DC
  Administered 2021-10-14 – 2021-10-15 (×2): 10 mg via ORAL
  Filled 2021-10-14 (×2): qty 1

## 2021-10-14 MED ORDER — BISACODYL 10 MG RE SUPP: 10.0000 mg | Freq: Every day | RECTAL | Status: DC | PRN

## 2021-10-14 MED ORDER — QUETIAPINE FUMARATE 25 MG PO TABS
50.0000 mg | ORAL_TABLET | Freq: Every day | ORAL | Status: DC
  Administered 2021-10-14: 50 mg via ORAL
  Filled 2021-10-14: qty 2

## 2021-10-14 MED ORDER — APIXABAN 5 MG PO TABS
5.0000 mg | ORAL_TABLET | Freq: Two times a day (BID) | ORAL | Status: DC
  Administered 2021-10-14 – 2021-10-15 (×2): 5 mg via ORAL
  Filled 2021-10-14 (×2): qty 1

## 2021-10-14 MED ORDER — SODIUM CHLORIDE 0.9% FLUSH
3.0000 mL | Freq: Two times a day (BID) | INTRAVENOUS | Status: DC
  Administered 2021-10-14 – 2021-10-15 (×2): 3 mL via INTRAVENOUS

## 2021-10-14 MED ORDER — TRAZODONE HCL 50 MG PO TABS: 50.0000 mg | ORAL_TABLET | Freq: Every evening | ORAL | Status: DC | PRN

## 2021-10-14 MED ORDER — POLYVINYL ALCOHOL 1.4 % OP SOLN: 1.0000 [drp] | Freq: Two times a day (BID) | OPHTHALMIC | Status: DC | PRN

## 2021-10-14 MED ORDER — CARBOXYMETHYLCELLULOSE SODIUM 1 % OP GEL: 1.0000 [drp] | OPHTHALMIC | Status: DC | PRN

## 2021-10-14 MED ORDER — ACETAMINOPHEN 650 MG RE SUPP: 650.0000 mg | Freq: Four times a day (QID) | RECTAL | Status: DC | PRN

## 2021-10-14 MED ORDER — METHIMAZOLE 5 MG PO TABS
5.0000 mg | ORAL_TABLET | Freq: Every day | ORAL | Status: DC
  Administered 2021-10-14 – 2021-10-15 (×2): 5 mg via ORAL
  Filled 2021-10-14 (×6): qty 1

## 2021-10-14 MED ORDER — ACETAMINOPHEN 325 MG PO TABS: 650.0000 mg | ORAL_TABLET | Freq: Four times a day (QID) | ORAL | Status: DC | PRN

## 2021-10-14 MED ORDER — SODIUM CHLORIDE 0.9% FLUSH: 3.0000 mL | INTRAVENOUS | Status: DC | PRN

## 2021-10-14 MED ORDER — SODIUM CHLORIDE 0.9 % IV SOLN: INTRAVENOUS | Status: DC

## 2021-10-14 MED ORDER — BUSPIRONE HCL 5 MG PO TABS
2.5000 mg | ORAL_TABLET | Freq: Every day | ORAL | Status: DC
  Administered 2021-10-14 – 2021-10-15 (×2): 2.5 mg via ORAL
  Filled 2021-10-14 (×2): qty 1

## 2021-10-14 NOTE — H&P (Signed)
Patient Demographics:    Darrell Lang, is a 76 y.o. male  MRN: [id number]   DOB - 1945-08-20  Admit Date - 10/14/2021  Outpatient Primary MD for the patient is Clinic, [place]:   Assessment and Plan:  1) orthostatic dizziness/near syncope-- -Orthostatic vitals:- -BP Lying down 170/100, [name], [name], [name] - Check a.m. cortisol level -Hold Flomax -Hold Lasix -Give gentle IV fluids -Troponin and EKG not suggestive of ACS -Watch for arrhythmias on telemetry -Patient has a Zio patch on  2) recent proteus and Klebsiella UTI--appears appropriately treated, repeat UA unremarkable -No further antibiotics needed at this time  3) chronic anemia--Hgb currently at baseline,  4) chronic A-fib--- continue Eliquis  5)BPH with enlarged prostate with bladder outlet obstruction (s/p UroLift procedure)--- stop Flomax due to orthostatic dizziness -Continue finasteride  6) hyperthyroidism--continue Toprol-XL  7)DM2- Use Novolog/Humalog Sliding scale insulin with Accu-Cheks/Fingersticks as ordered   Disposition/Need for in-Hospital Stay- patient unable to be discharged at this time due to near syncope and severe orthostatic hypotension requiring IV fluids and further investigations  Dispo: The patient is from: Home              Anticipated d/c is to: Home              Anticipated d/c date is: 1 day              Patient currently is not medically stable to d/c. Barriers: Not Clinically Stable-   With History of - Reviewed by me  Past Medical History:  Diagnosis Date   Anxiety and depression    Atrial fibrillation with RVR ([place]) 02/08/2019   COVID-19 virus infection 02/09/2019   Diabetes ([place])    Encounter for screening colonoscopy 07/28/2013    Essential hypertension 02/09/2019   Family history of adverse reaction to anesthesia    Hyperlipidemia 02/09/2019   Hypertension    Hypertriglyceridemia       Past Surgical History:  Procedure Laterality Date   APPENDECTOMY     6th grade   COLONOSCOPY  01/14/2004   Dr. Gala Romney: internal hemorrhoids, otherwise normal rectum. Normal colon and normal TI   COLONOSCOPY N/A 08/06/2013   normal, internal hemorrhoids   COLONOSCOPY WITH PROPOFOL N/A 07/04/2021   Procedure: COLONOSCOPY WITH PROPOFOL;  Surgeon: [name], DO;  [facility]: [facility];  Service: Endoscopy;  Laterality: N/A;  2:15pm   ESOPHAGOGASTRODUODENOSCOPY (EGD) WITH PROPOFOL N/A 07/04/2021   Procedure: ESOPHAGOGASTRODUODENOSCOPY (EGD) WITH PROPOFOL;  Surgeon: [name], DO;  Location: [facility];  Service: Endoscopy;  Laterality: N/A;   POLYPECTOMY  07/04/2021   Procedure: POLYPECTOMY;  Surgeon: [name], DO;  Location: [facility];  Service: Endoscopy;;    Chief Complaint  Patient presents with   Loss of Consciousness      HPI:    Darrell Lang  is  a 76 y.o. male with Pmhx relevant for chronic atrial fibrillation, enlarged prostate with bladder outlet obstruction (s/p UroLift procedure), diabetes, hyperthyroidism, HTN and COPD presents to the ED after episode of near syncope/dizziness that lasted almost 10 minutes earlier this morning -Patient has been having episodes of intermittent dizziness and near syncope, he had a Zio patch placed on 10/12/2021 at the [facility] clinic -EKG in the ED is A-fib with heart rate around 77 -Troponin is not elevated -CT head without acute findings -Chest x-ray without acute findings -Sodium 139 potassium 4.5 glucose 153 -Creatinine 1.05 -WBC 10.2, hgb 11.8 -Orthostatic vitals:- -BP Lying down 170/100, [name], [name], [name]   Review of systems:    In addition to the HPI above,   A full Review of  Systems was done, all  other systems reviewed are negative except as noted above in HPI , .    Social History:  Reviewed by me    Social History   Tobacco Use   Smoking status: Former    Packs/day: 0.00    Years: 26.00    Pack years: 0.00    Types: Cigarettes   Smokeless tobacco: Never   Tobacco comments:    Quit smoking in 1980s  Substance Use Topics   Alcohol use: No    Comment: history of ETOH abuse in remote past, none currently.      Family History :  Reviewed by me    Family History  Problem Relation Age of Onset   Stroke Father    Colon cancer Neg Hx    Thyroid disease Neg Hx    Colon polyps Neg Hx      Home Medications:   Prior to Admission medications   Medication Sig Start Date End Date Taking? Authorizing Provider  apixaban (ELIQUIS) 5 MG TABS tablet Take 1 tablet (5 mg total) by mouth 2 (two) times daily. 02/24/19  Yes Nahser, Wonda Cheng, MD  atorvastatin (LIPITOR) 10 MG tablet Take 10 mg by mouth daily. 06/21/20  Yes [provider]  benztropine (COGENTIN) 1 MG tablet Take 1 mg by mouth at bedtime.   Yes [provider]  busPIRone (BUSPAR) 5 MG tablet Take 2.5 mg by mouth daily. 09/21/20  Yes [provider]  carboxymethylcellulose (REFRESH PLUS) 0.5 % SOLN Place 1 drop into both eyes 2 (two) times daily as needed (dry eye). 04/11/21  Yes [provider]  Carboxymethylcellulose Sodium 1 % GEL Place 1 drop into both eyes as needed (eye glued together). 12/09/20  Yes [provider]  Cholecalciferol 50 MCG (2000 UT) TABS Take 2,000 Units by mouth 3 (three) times a week. 06/21/20  Yes [provider]  ciprofloxacin (CIPRO) 500 MG tablet Take 1 tablet (500 mg total) by mouth every 12 (twelve) hours for 10 days. 10/06/21 10/16/21 Yes Dorothyann Peng, PA-C  citalopram (CELEXA) 20 MG tablet Take 20 mg by mouth daily.   Yes [provider]  finasteride (PROSCAR) 5 MG tablet Take 5 mg by mouth daily. 06/21/20  Yes [provider]  furosemide (LASIX) 40 MG tablet Take 1 tablet (40 mg total) by mouth daily. 09/26/21 09/26/22 Yes Kathie Dike, MD  gabapentin (NEURONTIN) 300 MG capsule Take 300 mg by mouth 2 (two) times daily. 05/11/20  Yes [provider]  glipiZIDE (GLUCOTROL) 5 MG tablet Take 2.5-5 mg by mouth See admin instructions. Take 5 mg in the morning and 2.5 mg every evening  Yes [provider]  Magnesium Oxide 420 MG TABS Take 420 mg by mouth daily. 12/19/20  Yes [provider]  metFORMIN (GLUCOPHAGE) 1000 MG tablet Take 1,000-1,500 mg by mouth See admin instructions. Take 1000 mg by mouth in the morning and take 1500 mg in the evening   Yes [provider]  methimazole (TAPAZOLE) 5 MG tablet Take 1 tablet (5 mg total) by mouth daily. Hyperthyroidism: E05.90 07/01/19  Yes Renato Shin, MD  Multiple Vitamin (MULTIVITAMIN WITH MINERALS) TABS tablet Take 1 tablet by mouth daily. High potency   Yes [provider]  omeprazole (PRILOSEC) 20 MG capsule Take 20 mg by mouth daily.   Yes [provider]  potassium chloride (KLOR-CON M) 20 MEQ tablet Take 1 tablet (20 mEq total) by mouth daily. 09/26/21  Yes Kathie Dike, MD  QUEtiapine (SEROQUEL) 50 MG tablet Take 50 mg by mouth at bedtime.   Yes [provider]  risperiDONE (RISPERDAL) 1 MG tablet Take 1 mg by mouth at bedtime.   Yes [provider]  Semaglutide,0.25 or 0.'5MG'$ /DOS, 2 MG/1.5ML SOPN Inject 0.5 mg into the skin every Monday. 05/03/21  Yes [provider]  tamsulosin (FLOMAX) 0.4 MG CAPS capsule Take 0.8 mg by mouth at bedtime. 10/28/19  Yes [provider]  vitamin B-12 (CYANOCOBALAMIN) 500 MCG tablet Take 500 mcg by mouth in the morning and at bedtime. 06/21/20  Yes [provider]  metoprolol tartrate (LOPRESSOR) 25 MG tablet Take 1 tablet (25 mg total) by mouth 2 (two) times daily. Patient not taking: Reported on 09/25/2021 03/06/19   Nahser, Wonda Cheng, MD      Allergies:     Allergies  Allergen Reactions   Empagliflozin Other (See Comments)    Other reaction(s): Urinary tract infectious disease   Haldol [Haloperidol] Other (See Comments)    "Messed up his thoughts" pt takes risperidone    Lisinopril Cough   Sulfamethoxazole-Trimethoprim Other (See Comments)    Unknown reaction     Physical Exam:   Vitals  Blood pressure (!) 170/100, pulse 76, temperature 97.7 F (36.5 C), temperature source Oral, resp. rate 17, height '5\' 11"'$  (1.803 m), weight 82.1 kg, SpO2 100 %. -Orthostatic vitals:- -BP Lying down 170/100, [name], [name], [name]  Physical Examination: General appearance - alert,  in no distress Mental status - alert, oriented to person, place, and time,  Eyes - sclera anicteric Ears- HOH Neck - supple, no JVD elevation , Chest - clear  to auscultation bilaterally, symmetrical air movement,  Heart - S1 and S2 normal, irregularly irregular,, Zio patch in situ Abdomen - soft, nontender, nondistended, +BS Neurological - screening mental status exam normal, neck supple without rigidity, cranial nerves II through XII intact, DTR's normal and symmetric Extremities - no pedal edema noted, intact peripheral pulses  Skin - warm, dry     Data Review:    CBC Recent Labs  Lab 10/14/21 1215  WBC 10.2  HGB 11.8*  HCT 38.8*  PLT 284  MCV 84.9  MCH 25.8*  MCHC 30.4  RDW 15.7*  LYMPHSABS 1.1  MONOABS 0.5  EOSABS 0.1  BASOSABS 0.1   ------------------------------------------------------------------------------------------------------------------  Chemistries  Recent Labs  Lab 10/14/21 1215  NA 139  K 4.5  CL 105  CO2 27  GLUCOSE 153*  BUN 16  CREATININE 1.05  CALCIUM 9.6  AST 19  ALT 16  ALKPHOS 100  BILITOT 0.5   ------------------------------------------------------------------------------------------------------------------ estimated  creatinine clearance is 65.7  mL/min (by C-G formula based on SCr of 1.05 mg/dL). ------------------------------------------------------------------------------------------------------------------ No results for input(s): TSH, T4TOTAL, T3FREE, THYROIDAB in the last 72 hours.  Invalid input(s): FREET3   Coagulation profile No results for input(s): INR, PROTIME in the last 168 hours. ------------------------------------------------------------------------------------------------------------------- No results for input(s): DDIMER in the last 72 hours. -------------------------------------------------------------------------------------------------------------------  Cardiac Enzymes No results for input(s): CKMB, TROPONINI, MYOGLOBIN in the last 168 hours.  Invalid input(s): CK ------------------------------------------------------------------------------------------------------------------    Component Value Date/Time   BNP 268.0 (H) 10/06/2021 1407     ---------------------------------------------------------------------------------------------------------------  Urinalysis    Component Value Date/Time   COLORURINE STRAW (A) 10/14/2021 1439   APPEARANCEUR CLEAR 10/14/2021 1439   LABSPEC 1.009 10/14/2021 1439   PHURINE 8.0 10/14/2021 1439   GLUCOSEU NEGATIVE 10/14/2021 1439   HGBUR NEGATIVE 10/14/2021 1439   BILIRUBINUR NEGATIVE 10/14/2021 1439   KETONESUR NEGATIVE 10/14/2021 1439   PROTEINUR NEGATIVE 10/14/2021 1439   NITRITE NEGATIVE 10/14/2021 1439   LEUKOCYTESUR NEGATIVE 10/14/2021 1439    ----------------------------------------------------------------------------------------------------------------   Imaging Results:    DG Chest 2 View  Result Date: 10/14/2021 CLINICAL DATA:  Syncope. EXAM: CHEST - 2 VIEW COMPARISON:  10/06/2021. FINDINGS: Cardiac silhouette is normal in size and configuration. No mediastinal or hilar masses. No evidence of adenopathy. Lungs are clear.  No pleural effusion or  pneumothorax. Skeletal structures are intact. IMPRESSION: No active cardiopulmonary disease. Electronically Signed   By: Lajean Manes M.D.   On: 10/14/2021 13:14   CT Head Wo Contrast  Result Date: 10/14/2021 CLINICAL DATA:  Head trauma, minor (Age >= 65y) EXAM: CT HEAD WITHOUT CONTRAST TECHNIQUE: Contiguous axial images were obtained from the base of the skull through the vertex without intravenous contrast. RADIATION DOSE REDUCTION: This exam was performed according to the departmental dose-optimization program which includes automated exposure control, adjustment of the mA and/or kV according to patient size and/or use of iterative reconstruction technique. COMPARISON:  None Available. FINDINGS: Brain: No evidence of acute infarction, hemorrhage, hydrocephalus, extra-axial collection or mass lesion/mass effect. Scattered low-density changes within the periventricular and subcortical white matter compatible with chronic microvascular ischemic change. Mild diffuse cerebral volume loss. Vascular: Atherosclerotic calcifications involving the large vessels of the skull base. No unexpected hyperdense vessel. Skull: Normal. Negative for fracture or focal lesion. Sinuses/Orbits: Paranasal sinuses and mastoid air cells are clear. Other: None. IMPRESSION: 1. No acute intracranial abnormality. 2. Mild chronic microvascular ischemic change and cerebral volume loss. Electronically Signed   By: Davina Poke D.O.   On: 10/14/2021 13:23    Radiological Exams on Admission: DG Chest 2 View  Result Date: 10/14/2021 CLINICAL DATA:  Syncope. EXAM: CHEST - 2 VIEW COMPARISON:  10/06/2021. FINDINGS: Cardiac silhouette is normal in size and configuration. No mediastinal or hilar masses. No evidence of adenopathy. Lungs are clear.  No pleural effusion or pneumothorax. Skeletal structures are intact. IMPRESSION: No active cardiopulmonary disease. Electronically Signed   By: Lajean Manes M.D.   On: 10/14/2021 13:14   CT Head  Wo Contrast  Result Date: 10/14/2021 CLINICAL DATA:  Head trauma, minor (Age >= 65y) EXAM: CT HEAD WITHOUT CONTRAST TECHNIQUE: Contiguous axial images were obtained from the base of the skull through the vertex without intravenous contrast. RADIATION DOSE REDUCTION: This exam was performed according to the departmental dose-optimization program which includes automated exposure control, adjustment of the mA and/or kV according to patient size and/or use of iterative reconstruction technique. COMPARISON:  None Available. FINDINGS: Brain: No evidence of acute infarction,  hemorrhage, hydrocephalus, extra-axial collection or mass lesion/mass effect. Scattered low-density changes within the periventricular and subcortical white matter compatible with chronic microvascular ischemic change. Mild diffuse cerebral volume loss. Vascular: Atherosclerotic calcifications involving the large vessels of the skull base. No unexpected hyperdense vessel. Skull: Normal. Negative for fracture or focal lesion. Sinuses/Orbits: Paranasal sinuses and mastoid air cells are clear. Other: None. IMPRESSION: 1. No acute intracranial abnormality. 2. Mild chronic microvascular ischemic change and cerebral volume loss. Electronically Signed   By: Davina Poke D.O.   On: 10/14/2021 13:23    DVT Prophylaxis -SCD  /Heparin AM Labs Ordered, also please review Full Orders  Family Communication: Admission, patients condition and plan of care including tests being ordered have been discussed with the patient and wife who indicate understanding and agree with the plan   Condition   -stable  Roxan Hockey M.D on 10/14/2021 at 6:57 PM Go to [email or web address] -  for contact info  [facility] - Office  [phone]

## 2021-10-14 NOTE — ED Provider Notes (Signed)
[facility] EMERGENCY DEPARTMENT Provider Note   CSN: [id number] Arrival date & time: 10/14/21  1017     History  Chief Complaint  Patient presents with   Loss of Consciousness    Darrell Lang is a 76 y.o. male.  HPI 76 year old male presents with syncope/near syncope.  Patient has had similar presentations in the past and had a Zio patch applied by the [place] yesterday. Has a history of atrial fibrillation, diastolic CHF, diabetes, hypertension, and COPD.  He woke up and was not to go to the bathroom.  He was standing up to urinate and while he was standing there he started to feel progressively weaker and had to sit down to finish urinating.  Felt better while sitting but when he was up again he started to feel lightheaded and had to sit down.  Wife thought he was better but she states she heard him fall while he was try to get dressed.  Patient states his legs just felt so weak that he could not support him.  He fell and did strike the left side of his head but he states it was minor and does not hurt.  He is on Eliquis however.  He denies any chest pain, shortness of breath, or focal weakness today.  He states he has not felt any palpitations.  No recent vomiting or diarrhea.  He had a blood pressure med and his diuretic dosages reduced a couple days ago.  At rest he feels fine.  Home Medications Prior to Admission medications   Medication Sig Start Date End Date Taking? Authorizing Provider  apixaban (ELIQUIS) 5 MG TABS tablet Take 1 tablet (5 mg total) by mouth 2 (two) times daily. 02/24/19  Yes Nahser, Wonda Cheng, MD  atorvastatin (LIPITOR) 10 MG tablet Take 10 mg by mouth daily. 06/21/20  Yes [provider]  benztropine (COGENTIN) 1 MG tablet Take 1 mg by mouth at bedtime.   Yes [provider]  busPIRone (BUSPAR) 5 MG tablet Take 2.5 mg by mouth daily. 09/21/20  Yes [provider]  carboxymethylcellulose (REFRESH PLUS) 0.5 % SOLN Place 1 drop into both eyes  2 (two) times daily as needed (dry eye). 04/11/21  Yes [provider]  Carboxymethylcellulose Sodium 1 % GEL Place 1 drop into both eyes as needed (eye glued together). 12/09/20  Yes [provider]  Cholecalciferol 50 MCG (2000 UT) TABS Take 2,000 Units by mouth 3 (three) times a week. 06/21/20  Yes [provider]  ciprofloxacin (CIPRO) 500 MG tablet Take 1 tablet (500 mg total) by mouth every 12 (twelve) hours for 10 days. 10/06/21 10/16/21 Yes Dorothyann Peng, PA-C  citalopram (CELEXA) 20 MG tablet Take 20 mg by mouth daily.   Yes [provider]  finasteride (PROSCAR) 5 MG tablet Take 5 mg by mouth daily. 06/21/20  Yes [provider]  furosemide (LASIX) 40 MG tablet Take 1 tablet (40 mg total) by mouth daily. 09/26/21 09/26/22 Yes Kathie Dike, MD  gabapentin (NEURONTIN) 300 MG capsule Take 300 mg by mouth 2 (two) times daily. 05/11/20  Yes [provider]  glipiZIDE (GLUCOTROL) 5 MG tablet Take 2.5-5 mg by mouth See admin instructions. Take 5 mg in the morning and 2.5 mg every evening   Yes [provider]  Magnesium Oxide 420 MG TABS Take 420 mg by mouth daily. 12/19/20  Yes [provider]  metFORMIN (GLUCOPHAGE) 1000 MG tablet Take 1,000-1,500 mg by mouth See admin instructions. Take  1000 mg by mouth in the morning and take 1500 mg in the evening   Yes [provider]  methimazole (TAPAZOLE) 5 MG tablet Take 1 tablet (5 mg total) by mouth daily. Hyperthyroidism: E05.90 07/01/19  Yes Renato Shin, MD  Multiple Vitamin (MULTIVITAMIN WITH MINERALS) TABS tablet Take 1 tablet by mouth daily. High potency   Yes [provider]  omeprazole (PRILOSEC) 20 MG capsule Take 20 mg by mouth daily.   Yes [provider]  potassium chloride (KLOR-CON M) 20 MEQ tablet Take 1 tablet (20 mEq total) by mouth daily. 09/26/21  Yes Kathie Dike, MD  QUEtiapine (SEROQUEL) 50 MG tablet Take 50 mg by mouth at bedtime.   Yes  [provider]  risperiDONE (RISPERDAL) 1 MG tablet Take 1 mg by mouth at bedtime.   Yes [provider]  Semaglutide,0.25 or 0.'5MG'$ /DOS, 2 MG/1.5ML SOPN Inject 0.5 mg into the skin every Monday. 05/03/21  Yes [provider]  tamsulosin (FLOMAX) 0.4 MG CAPS capsule Take 0.8 mg by mouth at bedtime. 10/28/19  Yes [provider]  vitamin B-12 (CYANOCOBALAMIN) 500 MCG tablet Take 500 mcg by mouth in the morning and at bedtime. 06/21/20  Yes [provider]  metoprolol tartrate (LOPRESSOR) 25 MG tablet Take 1 tablet (25 mg total) by mouth 2 (two) times daily. Patient not taking: Reported on 09/25/2021 03/06/19   Nahser, Wonda Cheng, MD      Allergies    Empagliflozin, Haldol [haloperidol], Lisinopril, and Sulfamethoxazole-trimethoprim    Review of Systems   Review of Systems  Constitutional:  Negative for fever.  Respiratory:  Positive for cough (chronic, unchanged). Negative for shortness of breath.   Cardiovascular:  Negative for chest pain.  Gastrointestinal:  Negative for abdominal pain, diarrhea and vomiting.  Neurological:  Positive for syncope, weakness and light-headedness. Negative for dizziness, numbness and headaches.   Physical Exam Updated Vital Signs BP (!) 176/80   Pulse 67   Temp 98.4 F (36.9 C) (Oral)   Resp 14   Ht '5\' 11"'$  (1.803 m)   Wt 82.1 kg   SpO2 98%   BMI 25.24 kg/m  Physical Exam Vitals and nursing note reviewed.  Constitutional:      Appearance: He is well-developed.  HENT:     Head: Normocephalic and atraumatic.     Comments: No obvious head trauma    Mouth/Throat:     Mouth: Mucous membranes are dry.  Eyes:     Extraocular Movements: Extraocular movements intact.     Pupils: Pupils are equal, round, and reactive to light.  Cardiovascular:     Rate and Rhythm: Normal rate and regular rhythm.     Heart sounds: Normal heart sounds.  Pulmonary:     Effort: Pulmonary effort is normal.     Breath sounds:  Wheezing (diffuse, expiratory) present.  Abdominal:     General: There is no distension.     Palpations: Abdomen is soft.     Tenderness: There is no abdominal tenderness.  Skin:    General: Skin is warm and dry.  Neurological:     Mental Status: He is alert.     Comments: CN 3-12 grossly intact. 5/5 strength in all 4 extremities. Grossly normal sensation. Normal finger to nose.     ED Results / Procedures / Treatments   Labs (all labs ordered are listed, but only abnormal results are displayed) Labs Reviewed  CBC WITH DIFFERENTIAL/PLATELET - Abnormal; Notable for the following components:  Result Value   Hemoglobin 11.8 (*)    HCT 38.8 (*)    MCH 25.8 (*)    RDW 15.7 (*)    Neutro Abs 8.4 (*)    All other components within normal limits  URINALYSIS, ROUTINE W REFLEX MICROSCOPIC - Abnormal; Notable for the following components:   Color, Urine STRAW (*)    All other components within normal limits  COMPREHENSIVE METABOLIC PANEL - Abnormal; Notable for the following components:   Glucose, Bld 153 (*)    All other components within normal limits  CBG MONITORING, ED - Abnormal; Notable for the following components:   Glucose-Capillary 146 (*)    All other components within normal limits  TROPONIN I (HIGH SENSITIVITY)  TROPONIN I (HIGH SENSITIVITY)    EKG None  Radiology DG Chest 2 View  Result Date: 10/14/2021 CLINICAL DATA:  Syncope. EXAM: CHEST - 2 VIEW COMPARISON:  10/06/2021. FINDINGS: Cardiac silhouette is normal in size and configuration. No mediastinal or hilar masses. No evidence of adenopathy. Lungs are clear.  No pleural effusion or pneumothorax. Skeletal structures are intact. IMPRESSION: No active cardiopulmonary disease. Electronically Signed   By: Lajean Manes M.D.   On: 10/14/2021 13:14   CT Head Wo Contrast  Result Date: 10/14/2021 CLINICAL DATA:  Head trauma, minor (Age >= 65y) EXAM: CT HEAD WITHOUT CONTRAST TECHNIQUE: Contiguous axial images were  obtained from the base of the skull through the vertex without intravenous contrast. RADIATION DOSE REDUCTION: This exam was performed according to the departmental dose-optimization program which includes automated exposure control, adjustment of the mA and/or kV according to patient size and/or use of iterative reconstruction technique. COMPARISON:  None Available. FINDINGS: Brain: No evidence of acute infarction, hemorrhage, hydrocephalus, extra-axial collection or mass lesion/mass effect. Scattered low-density changes within the periventricular and subcortical white matter compatible with chronic microvascular ischemic change. Mild diffuse cerebral volume loss. Vascular: Atherosclerotic calcifications involving the large vessels of the skull base. No unexpected hyperdense vessel. Skull: Normal. Negative for fracture or focal lesion. Sinuses/Orbits: Paranasal sinuses and mastoid air cells are clear. Other: None. IMPRESSION: 1. No acute intracranial abnormality. 2. Mild chronic microvascular ischemic change and cerebral volume loss. Electronically Signed   By: Davina Poke D.O.   On: 10/14/2021 13:23    Procedures Procedures    Medications Ordered in ED Medications  0.9 %  sodium chloride infusion ( Intravenous New Bag/Given 10/14/21 1525)  atorvastatin (LIPITOR) tablet 10 mg (10 mg Oral Given 10/14/21 1543)  busPIRone (BUSPAR) tablet 2.5 mg (2.5 mg Oral Given 10/14/21 1543)  citalopram (CELEXA) tablet 20 mg (20 mg Oral Given 10/14/21 1543)  QUEtiapine (SEROQUEL) tablet 50 mg (has no administration in time range)  risperiDONE (RISPERDAL) tablet 1 mg (has no administration in time range)  methimazole (TAPAZOLE) tablet 5 mg (has no administration in time range)  finasteride (PROSCAR) tablet 5 mg (5 mg Oral Given 10/14/21 1543)  apixaban (ELIQUIS) tablet 5 mg (5 mg Oral Given 10/14/21 1543)  benztropine (COGENTIN) tablet 1 mg (has no administration in time range)  gabapentin (NEURONTIN) capsule 300 mg (has  no administration in time range)  polyvinyl alcohol (LIQUIFILM TEARS) 1.4 % ophthalmic solution 1 drop (has no administration in time range)  sodium chloride flush (NS) 0.9 % injection 3 mL (has no administration in time range)  sodium chloride flush (NS) 0.9 % injection 3 mL (has no administration in time range)  sodium chloride flush (NS) 0.9 % injection 3 mL (has no administration in time range)  0.9 %  sodium chloride infusion (has no administration in time range)  acetaminophen (TYLENOL) tablet 650 mg (has no administration in time range)    Or  acetaminophen (TYLENOL) suppository 650 mg (has no administration in time range)  traZODone (DESYREL) tablet 50 mg (has no administration in time range)  polyethylene glycol (MIRALAX / GLYCOLAX) packet 17 g (has no administration in time range)  bisacodyl (DULCOLAX) suppository 10 mg (has no administration in time range)  ondansetron (ZOFRAN) tablet 4 mg (has no administration in time range)    Or  ondansetron (ZOFRAN) injection 4 mg (has no administration in time range)  insulin aspart (novoLOG) injection 0-6 Units (has no administration in time range)  insulin aspart (novoLOG) injection 0-5 Units (has no administration in time range)  lactated ringers bolus 1,000 mL (0 mLs Intravenous Stopped 10/14/21 1306)    ED Course/ Medical Decision Making/ A&P                           Medical Decision Making Amount and/or Complexity of Data Reviewed Independent Historian: spouse External Data Reviewed: notes. Labs: ordered. Radiology: ordered and independent interpretation performed. ECG/medicine tests: ordered and independent interpretation performed.  Risk Decision regarding hospitalization.   Patient presents with bilateral leg weakness and orthostasis. He was quite orthostatic here. Received 1L IVF. Labs interpreted, has no aki, hemoglobin near baseline. Wife states he's currently on cipro for UTI. They feel uncomfortable going home. Zio  patch is unable to be interrogated based on device type after discussing with cardiology. Will admit for obs, fluids and medication management. D/w Dr. Denton Brick for admission. CT head obtained given minor head injury with syncope. I have personally viewed/interpreted these images, no head bleed. EKG with no acute ischemia. Has known afib, unchanged.         Final Clinical Impression(s) / ED Diagnoses Final diagnoses:  Orthostatic hypotension    Rx / DC Orders ED Discharge Orders     None         Sherwood Gambler, MD 10/14/21 1614

## 2021-10-14 NOTE — ED Triage Notes (Signed)
Pt brought in by ems for c/o syncopal episodes; pt was seen at [facility] tomorrow for same complaints;  Pt woke up this am and had syncopal episode and hit head, pt is on a blood thinner  Cbg 192   Pt denies any pain

## 2021-10-15 DIAGNOSIS — I951 Orthostatic hypotension: Secondary | ICD-10-CM | POA: Diagnosis not present

## 2021-10-15 LAB — CORTISOL-AM, BLOOD: Cortisol - AM: 5.1 ug/dL — ABNORMAL LOW (ref 6.7–22.6)

## 2021-10-15 LAB — GLUCOSE, CAPILLARY
Glucose-Capillary: 108 mg/dL — ABNORMAL HIGH (ref 70–99)
Glucose-Capillary: 170 mg/dL — ABNORMAL HIGH (ref 70–99)

## 2021-10-15 MED ORDER — POTASSIUM CHLORIDE CRYS ER 20 MEQ PO TBCR: 20.0000 meq | EXTENDED_RELEASE_TABLET | ORAL | 1 refills | Status: AC

## 2021-10-15 MED ORDER — FUROSEMIDE 40 MG PO TABS: 20.0000 mg | ORAL_TABLET | ORAL | 3 refills | Status: AC

## 2021-10-15 MED ORDER — METOPROLOL TARTRATE 25 MG PO TABS: 12.5000 mg | ORAL_TABLET | Freq: Two times a day (BID) | ORAL | 2 refills | Status: AC

## 2021-10-15 MED ORDER — ACETAMINOPHEN 325 MG PO TABS: 650.0000 mg | ORAL_TABLET | Freq: Four times a day (QID) | ORAL | 0 refills | Status: AC | PRN

## 2021-10-15 NOTE — Progress Notes (Signed)
Ambulated from room to end of hall at room 323 and back at brisk pace with walker.  No dizziness and bp was 130/63, pulse 83 .  IV removed and DC instruction reviewed with patient and wife.  Dr. Roxan Hockey came to room and answered concerns about medication adjustments and so forth to their satisfaction.  To be transported by [facility] to main entrance.  Wife to drive home

## 2021-10-15 NOTE — Discharge Instructions (Addendum)
1)Avoid ibuprofen/Advil/Aleve/Motrin/Goody Powders/Naproxen/BC powders/Meloxicam/Diclofenac/Indomethacin and other Nonsteroidal anti-inflammatory medications as these will make you more likely to bleed and can cause stomach ulcers, can also cause Kidney problems.   2) stop Flomax/tamsulosin due to orthostatic hypotension  3) change Lasix/furosemide to 20 mg on Mondays Wednesdays and Fridays only  4) follow-up with the [facility] cardiology service to review your Zio patch heart monitor download  5)Your AM cortisol level is low, please [facility] to [facility] endocrinologist for further testing --- you may need Florinef--- no cortisol levels may explain your orthostatic hypotension (low blood pressure when you stand up)

## 2021-10-15 NOTE — Discharge Summary (Signed)
Darrell Lang, is a 76 y.o. male  DOB [birth year]  MRN [id number].  Admission date:  10/14/2021  Admitting Physician  Darrell Hockey, MD  Discharge Date:  10/15/2021   Primary MD  Clinic, [facility]  Recommendations for primary care physician for things to follow:   1)Avoid ibuprofen/Advil/Aleve/Motrin/Goody Powders/Naproxen/BC powders/Meloxicam/Diclofenac/Indomethacin and other Nonsteroidal anti-inflammatory medications as these will make you more likely to bleed and can cause stomach ulcers, can also cause Kidney problems.   2) stop Flomax/tamsulosin due to orthostatic hypotension  3) change Lasix/furosemide to 20 mg on Mondays Wednesdays and Fridays only  4) follow-up with the [facility] cardiology service to review your Zio patch heart monitor download  5)Your AM cortisol level is low, please have your [facility] primary care physician refer you to the [facility] endocrinologist for further testing --- you may need Florinef--- no cortisol levels may explain your orthostatic hypotension (low blood pressure when you stand up)  Admission Diagnosis  Orthostatic hypotension [I95.1]   Discharge Diagnosis  Orthostatic hypotension [I95.1]    Principal Problem:   Orthostatic hypotension Active Problems:   Essential hypertension   Darrell Lang   Darrell Lang, chronic (HCC)      Past Medical History:  Diagnosis Date   Anxiety and depression    Darrell Lang with RVR (Darrell Lang) 02/08/2019   COVID-19 virus infection 02/09/2019   Darrell Lang (Darrell Lang)    Encounter for screening colonoscopy 07/28/2013   Essential hypertension 02/09/2019   Family history of adverse reaction to anesthesia    Hyperlipidemia 02/09/2019   Hypertension    Hypertriglyceridemia     Past Surgical History:  Procedure Laterality Date   APPENDECTOMY     6th grade   COLONOSCOPY  01/14/2004   Dr. Gala Lang: internal hemorrhoids,  otherwise normal rectum. Normal colon and normal TI   COLONOSCOPY N/A 08/06/2013   normal, internal hemorrhoids   COLONOSCOPY WITH PROPOFOL N/A 07/04/2021   Procedure: COLONOSCOPY WITH PROPOFOL;  Surgeon: Darrell Harman, DO;  [facility];  Service: Endoscopy;  Laterality: N/A;  2:15pm   ESOPHAGOGASTRODUODENOSCOPY (EGD) WITH PROPOFOL N/A 07/04/2021   Procedure: ESOPHAGOGASTRODUODENOSCOPY (EGD) WITH PROPOFOL;  Surgeon: Darrell Harman, DO;  [facility];  Service: Endoscopy;  Laterality: N/A;   POLYPECTOMY  07/04/2021   Procedure: POLYPECTOMY;  Surgeon: Darrell Harman, DO;  Location: [facility];  Service: Endoscopy;;     HPI  from the history and physical done on the day of admission:   Darrell Lang  is a 76 y.o. male with Pmhx relevant for chronic Darrell Lang, Darrell Lang with bladder outlet obstruction (s/p UroLift procedure), Darrell Lang, Darrell Lang, Darrell Lang, Darrell Lang had a Zio patch placed on 10/12/2021 at the [facility] -EKG in the ED is A-fib with heart rate around 77 -Troponin is not elevated -CT head without acute findings -Chest x-ray without acute findings -Sodium  139 potassium 4.5 glucose 153 -Creatinine 1.05 -WBC 10.2, hgb 11.8 -Orthostatic vitals:- -BP Lying down 170/100, [name], [name], [name]    Lang Course:    Assessment and Plan: 1) orthostatic dizziness/near Lang-- -[name], [name], [name], [name]  -Orthostatic vitals: At discharge-- -BP Lying 91/60, HR 97 -BP Sitting --135/87, HR 78 -BP Standing 158/80 , HR 73 - verified vital signs again it appears to be paradoxical with patient's blood pressure actually  went higher and higher and patient's heart rate went lower when going from laying down to sitting down to standing up--rather paradoxical compared to findings on admission  AM cortisol level is 5.1, patient is advised to follow-up with endocrinologist at the [facility] for further investigation including ACTH stimulation test -May need Florinef given his orthostatic hypotension issues  - Hemodynamics improved with IV fluids and discontinuation of Flomax and Lasix -Stop Flomax Change Lasix to 20 mg Monday Wednesday Fridays -Troponin and EKG not suggestive of ACS -Patient has a Zio patch on   2) recent proteus and Klebsiella UTI--treated with antibiotics for about 6 days or so,  -appears appropriately treated, repeat UA unremarkable -No further antibiotics needed at this time   3)Chronic Anemia--Hgb currently at baseline,   4)Chronic A-fib--- continue metoprolol 12.5 mg twice daily and Eliquis, patient has a Zio patch   5)BPH with Darrell Lang with bladder outlet obstruction (s/p UroLift procedure)--- stop Flomax due to orthostatic dizziness -Continue finasteride -Postvoid residual 211 ml today -Continue intermittent self-catheterization at home -Follow-up with urologist at [facility]   6)Darrell Lang--continue metoprolol 12.5 mg twice daily  7)DM2-recent A1c 7.2 reflecting uncontrolled DM with hyperglycemia -Resume home regimen and follow-up with PCP and endocrinologist for further adjustment   Disposition/--- Home   Dispo: The patient is from: Home              Anticipated d/c is to: Home Discharge Condition: --Stable, orthostatic vitals improved significantly as noted above  Follow UP   Follow-up Information     Clinic, [place]. Schedule an appointment as soon as possible for a visit in 1 week(s).   Contact information: [place] [phone] [phone]                Diet and Activity recommendation:  As  advised  Discharge Instructions    Discharge Instructions     Call MD for:  difficulty breathing, headache or visual disturbances   Complete by: As directed    Call MD for:  persistant dizziness or light-headedness   Complete by: As directed    Call MD for:  persistant nausea and vomiting   Complete by: As directed    Call MD for:  temperature >100.4   Complete by: As directed    Diet - low sodium heart healthy   Complete by: As directed    Discharge instructions   Complete by: As directed    1)Avoid ibuprofen/Advil/Aleve/Motrin/Goody Powders/Naproxen/BC powders/Meloxicam/Diclofenac/Indomethacin and other Nonsteroidal anti-inflammatory medications as these will make you more likely to bleed and can cause stomach ulcers, can also cause Kidney problems.   2) stop Flomax/tamsulosin due to orthostatic hypotension  3) change Lasix/furosemide to 20 mg on Mondays Wednesdays and Fridays only  4) follow-up with the [facility] cardiology service to review your Zio patch heart monitor download  5)Your AM cortisol level is low, please have  your [facility] primary care physician refer you to the [facility] endocrinologist for further testing --- you may need Florinef--- no cortisol levels may explain your orthostatic hypotension (low blood pressure when you stand up)   Increase activity slowly   Complete by: As directed          Discharge Medications     Allergies as of 10/15/2021       Reactions   Empagliflozin Other (See Comments)   Other reaction(s): Urinary tract infectious disease   Haldol [haloperidol] Other (See Comments)   "Messed up his thoughts" pt takes risperidone    Lisinopril Cough   Sulfamethoxazole-trimethoprim Other (See Comments)   Unknown reaction        Medication List     STOP taking these medications    ciprofloxacin 500 MG tablet Commonly known as: CIPRO   tamsulosin 0.4 MG Caps capsule Commonly known as: FLOMAX       TAKE these medications    acetaminophen  325 MG tablet Commonly known as: TYLENOL Take 2 tablets (650 mg total) by mouth every 6 (six) hours as needed for mild pain (or Fever >/= 101).   apixaban 5 MG Tabs tablet Commonly known as: ELIQUIS Take 1 tablet (5 mg total) by mouth 2 (two) times daily.   atorvastatin 10 MG tablet Commonly known as: LIPITOR Take 10 mg by mouth daily.   benztropine 1 MG tablet Commonly known as: COGENTIN Take 1 mg by mouth at bedtime.   busPIRone 5 MG tablet Commonly known as: BUSPAR Take 2.5 mg by mouth daily.   Carboxymethylcellulose Sodium 1 % Gel Place 1 drop into both eyes as needed (eye glued together).   carboxymethylcellulose 0.5 % Soln Commonly known as: REFRESH PLUS Place 1 drop into both eyes 2 (two) times daily as needed (dry eye).   Cholecalciferol 50 MCG (2000 UT) Tabs Take 2,000 Units by mouth 3 (three) times a week.   citalopram 20 MG tablet Commonly known as: CELEXA Take 20 mg by mouth daily.   finasteride 5 MG tablet Commonly known as: PROSCAR Take 5 mg by mouth daily.   furosemide 40 MG tablet Commonly known as: Lasix Take 0.5 tablets (20 mg total) by mouth every Monday, Wednesday, and Friday. Start taking on: October 17, 2021 What changed:  how much to take when to take this   gabapentin 300 MG capsule Commonly known as: NEURONTIN Take 300 mg by mouth 2 (two) times daily.   glipiZIDE 5 MG tablet Commonly known as: GLUCOTROL Take 2.5-5 mg by mouth See admin instructions. Take 5 mg in the morning and 2.5 mg every evening   Magnesium Oxide 420 MG Tabs Take 420 mg by mouth daily.   metFORMIN 1000 MG tablet Commonly known as: GLUCOPHAGE Take 1,000-1,500 mg by mouth See admin instructions. Take 1000 mg by mouth in the morning and take 1500 mg in the evening   methimazole 5 MG tablet Commonly known as: TAPAZOLE Take 1 tablet (5 mg total) by mouth daily. [facility]rell [facility]: E05.90   metoprolol tartrate 25 MG tablet Commonly known as: LOPRESSOR Take 0.5 tablets  (12.5 mg total) by mouth 2 (two) times daily. What changed: how much to take   multivitamin with minerals Tabs tablet Take 1 tablet by mouth daily. High potency   omeprazole 20 MG capsule Commonly known as: PRILOSEC Take 20 mg by mouth daily.   potassium chloride SA 20 MEQ tablet Commonly known as: KLOR-CON M Take 1 tablet (20 mEq total) by mouth See  admin instructions. On Mondays Wednesdays and Fridays only--take only when you take Lasix/furosemide What changed:  when to take this additional instructions   QUEtiapine 50 MG tablet Commonly known as: SEROQUEL Take 50 mg by mouth at bedtime.   risperiDONE 1 MG tablet Commonly known as: RISPERDAL Take 1 mg by mouth at bedtime.   Semaglutide(0.25 or 0.'5MG'$ /DOS) 2 MG/1.5ML Sopn Inject 0.5 mg into the skin every Monday.   vitamin B-12 500 MCG tablet Commonly known as: CYANOCOBALAMIN Take 500 mcg by mouth in the morning and at bedtime.        Major procedures and Radiology Reports - PLEASE review detailed and final reports for all details, in brief -   DG Chest 2 View  Result Date: 10/14/2021 CLINICAL DATA:  Lang. EXAM: CHEST - 2 VIEW COMPARISON:  10/06/2021. FINDINGS: Cardiac silhouette is normal in size and configuration. No mediastinal or hilar masses. No evidence of adenopathy. Lungs are clear.  No pleural effusion or pneumothorax. Skeletal structures are intact. IMPRESSION: No active cardiopulmonary disease. Electronically Signed   By: Lajean Manes M.D.   On: 10/14/2021 13:14   DG Chest 2 View  Result Date: 10/06/2021 CLINICAL DATA:  Weakness and abnormal breath sounds. EXAM: CHEST - 2 VIEW COMPARISON:  09/24/2021 and prior studies FINDINGS: The cardiomediastinal silhouette is unremarkable. There is no evidence of focal airspace disease, pulmonary edema, suspicious pulmonary nodule/mass, pleural effusion, or pneumothorax. No acute bony abnormalities are identified. IMPRESSION: No active cardiopulmonary disease.  Electronically Signed   By: Margarette Canada M.D.   On: 10/06/2021 15:17   CT Head Wo Contrast  Result Date: 10/14/2021 CLINICAL DATA:  Head trauma, minor (Age >= 65y) EXAM: CT HEAD WITHOUT CONTRAST TECHNIQUE: Contiguous axial images were obtained from the base of the skull through the vertex without intravenous contrast. RADIATION DOSE REDUCTION: This exam was performed according to the departmental dose-optimization program which includes automated exposure control, adjustment of the mA and/or kV according to patient size and/or use of iterative reconstruction technique. COMPARISON:  None Available. FINDINGS: Brain: No evidence of acute infarction, hemorrhage, hydrocephalus, extra-axial collection or mass lesion/mass effect. Scattered low-density changes within the periventricular and subcortical white matter compatible with chronic microvascular ischemic change. Mild diffuse cerebral volume loss. Vascular: Atherosclerotic calcifications involving the large vessels of the skull base. No unexpected hyperdense vessel. Skull: Normal. Negative for fracture or focal lesion. Sinuses/Orbits: Paranasal sinuses and mastoid air cells are clear. Other: None. IMPRESSION: 1. No acute intracranial abnormality. 2. Mild chronic microvascular ischemic change and cerebral volume loss. Electronically Signed   By: Davina Poke D.O.   On: 10/14/2021 13:23   DG Chest Portable 1 View  Result Date: 09/24/2021 CLINICAL DATA:  dyspnea EXAM: PORTABLE CHEST 1 VIEW COMPARISON:  Chest radiograph dated August 21, 2021 FINDINGS: The cardiomediastinal silhouette is unchanged in contour.Biapical scarring. Favored trace bilateral pleural effusions pleural effusion. No pneumothorax. Increased bibasilar predominant reticular opacities. Visualized abdomen is unremarkable. IMPRESSION: Constellation of findings are favored to reflect pulmonary edema with trace bilateral pleural effusions. Electronically Signed   By: Valentino Saxon M.D.   On:  09/24/2021 13:28    Micro Results  Recent Results (from the past 240 hour(s))  Urine Culture     Status: Abnormal   Collection Time: 10/06/21  8:44 PM   Specimen: In/Out Cath Urine  Result Value Ref Range Status   Specimen Description   Final    IN/OUT CATH URINE Performed at [facility], [address], [place], [place] [address]  Special Requests   Final    NONE Performed at [facility], [address] [place], [place] [address]    Culture (A)  Final    >=100,000 COLONIES/mL KLEBSIELLA PNEUMONIAE 10,000 COLONIES/mL PROTEUS MIRABILIS    Report Status 10/09/2021 FINAL  Final   Organism ID, Bacteria KLEBSIELLA PNEUMONIAE (A)  Final   Organism ID, Bacteria PROTEUS MIRABILIS (A)  Final      Susceptibility   Klebsiella pneumoniae - MIC*    AMPICILLIN RESISTANT Resistant     CEFAZOLIN <=4 SENSITIVE Sensitive     CEFEPIME <=0.12 SENSITIVE Sensitive     CEFTRIAXONE <=0.25 SENSITIVE Sensitive     CIPROFLOXACIN <=0.25 SENSITIVE Sensitive     GENTAMICIN <=1 SENSITIVE Sensitive     IMIPENEM <=0.25 SENSITIVE Sensitive     NITROFURANTOIN <=16 SENSITIVE Sensitive     TRIMETH/SULFA <=20 SENSITIVE Sensitive     AMPICILLIN/SULBACTAM 4 SENSITIVE Sensitive     PIP/TAZO <=4 SENSITIVE Sensitive     * >=100,000 COLONIES/mL KLEBSIELLA PNEUMONIAE   Proteus mirabilis - MIC*    AMPICILLIN <=2 SENSITIVE Sensitive     CEFAZOLIN <=4 SENSITIVE Sensitive     CEFEPIME <=0.12 SENSITIVE Sensitive     CEFTRIAXONE <=0.25 SENSITIVE Sensitive     CIPROFLOXACIN <=0.25 SENSITIVE Sensitive     GENTAMICIN <=1 SENSITIVE Sensitive     IMIPENEM 4 SENSITIVE Sensitive     NITROFURANTOIN RESISTANT Resistant     TRIMETH/SULFA <=20 SENSITIVE Sensitive     AMPICILLIN/SULBACTAM <=2 SENSITIVE Sensitive     PIP/TAZO <=4 SENSITIVE Sensitive     * 10,000 COLONIES/mL PROTEUS MIRABILIS    Today   Subjective    Zayden Maffei today has no new complaints  No fever  Or chills   No Nausea, Vomiting or  Diarrhea Ambulated in the hallways without chest pains, dizziness palpitations or shortness of breath        Patient has been seen and examined prior to discharge   Objective   Blood pressure 130/63, pulse 83, temperature (!) 97.5 F (36.4 C), temperature source Oral, resp. rate 18, height '5\' 11"'$  (1.803 m), weight 82.1 kg, SpO2 97 %.   Intake/Output Summary (Last 24 hours) at 10/15/2021 1415 Last data filed at 10/15/2021 1300 Gross per 24 hour  Intake 2040.56 ml  Output --  Net 2040.56 ml    Exam Physical Examination: General appearance - alert,  in no distress Mental status - alert, oriented to person, place, and time,  Eyes - sclera anicteric Ears- HOH Neck - supple, no JVD elevation , Chest - clear  to auscultation bilaterally, symmetrical air movement,  Heart - S1 and S2 normal, irregularly irregular,, Zio patch in situ Abdomen - soft, nontender, nondistended, +BS Neurological - screening mental status exam normal, neck supple without rigidity, cranial nerves II through XII intact, DTR's normal and symmetric Extremities - no pedal edema noted, intact peripheral pulses  Skin - warm, dry   Data Review   CBC w Diff:  Lab Results  Component Value Date   WBC 10.2 10/14/2021   HGB 11.8 (L) 10/14/2021   HCT 38.8 (L) 10/14/2021   PLT 284 10/14/2021   LYMPHOPCT 10 10/14/2021   MONOPCT 5 10/14/2021   EOSPCT 1 10/14/2021   BASOPCT 1 10/14/2021    CMP:  Lab Results  Component Value Date   NA 139 10/14/2021   K 4.5 10/14/2021   CL 105 10/14/2021   CO2 27 10/14/2021   BUN 16 10/14/2021   CREATININE 1.05 10/14/2021  PROT 7.8 10/14/2021   ALBUMIN 4.1 10/14/2021   BILITOT 0.5 10/14/2021   ALKPHOS 100 10/14/2021   AST 19 10/14/2021   ALT 16 10/14/2021  .  Total Discharge time is about 33 minutes  Darrell Lang M.D on 10/15/2021 at 2:15 PM  Go to [email or web address] -  for contact info  [facility] - Office  [phone]

## 2021-10-15 NOTE — Progress Notes (Signed)
   10/15/21 0850  Orthostatic Lying   BP- Lying 91/60  Pulse- Lying 97  Orthostatic Sitting  BP- Sitting 135/87  Pulse- Sitting 78  Orthostatic Standing at 0 minutes  BP- Standing at 0 minutes 114/68  Pulse- Standing at 0 minutes 81  Orthostatic Standing at 3 minutes  BP- Standing at 3 minutes 158/80  Pulse- Standing at 3 minutes 73

## 2021-10-15 NOTE — Progress Notes (Signed)
Nsg Discharge Note  Admit Date:  10/14/2021 Discharge date: 10/15/2021   ADRION MENZ to be D/C'd Home per MD order.  AVS completed. Patient/caregiver able to verbalize understanding.  Discharge Medication: Allergies as of 10/15/2021       Reactions   Empagliflozin Other (See Comments)   Other reaction(s): Urinary tract infectious disease   Haldol [haloperidol] Other (See Comments)   "Messed up his thoughts" pt takes risperidone    Lisinopril Cough   Sulfamethoxazole-trimethoprim Other (See Comments)   Unknown reaction        Medication List     STOP taking these medications    ciprofloxacin 500 MG tablet Commonly known as: CIPRO   tamsulosin 0.4 MG Caps capsule Commonly known as: FLOMAX       TAKE these medications    acetaminophen 325 MG tablet Commonly known as: TYLENOL Take 2 tablets (650 mg total) by mouth every 6 (six) hours as needed for mild pain (or Fever >/= 101).   apixaban 5 MG Tabs tablet Commonly known as: ELIQUIS Take 1 tablet (5 mg total) by mouth 2 (two) times daily.   atorvastatin 10 MG tablet Commonly known as: LIPITOR Take 10 mg by mouth daily.   benztropine 1 MG tablet Commonly known as: COGENTIN Take 1 mg by mouth at bedtime.   busPIRone 5 MG tablet Commonly known as: BUSPAR Take 2.5 mg by mouth daily.   Carboxymethylcellulose Sodium 1 % Gel Place 1 drop into both eyes as needed (eye glued together).   carboxymethylcellulose 0.5 % Soln Commonly known as: REFRESH PLUS Place 1 drop into both eyes 2 (two) times daily as needed (dry eye).   Cholecalciferol 50 MCG (2000 UT) Tabs Take 2,000 Units by mouth 3 (three) times a week.   citalopram 20 MG tablet Commonly known as: CELEXA Take 20 mg by mouth daily.   finasteride 5 MG tablet Commonly known as: PROSCAR Take 5 mg by mouth daily.   furosemide 40 MG tablet Commonly known as: Lasix Take 0.5 tablets (20 mg total) by mouth every Monday, Wednesday, and Friday. Start taking  on: October 17, 2021 What changed:  how much to take when to take this   gabapentin 300 MG capsule Commonly known as: NEURONTIN Take 300 mg by mouth 2 (two) times daily.   glipiZIDE 5 MG tablet Commonly known as: GLUCOTROL Take 2.5-5 mg by mouth See admin instructions. Take 5 mg in the morning and 2.5 mg every evening   Magnesium Oxide 420 MG Tabs Take 420 mg by mouth daily.   metFORMIN 1000 MG tablet Commonly known as: GLUCOPHAGE Take 1,000-1,500 mg by mouth See admin instructions. Take 1000 mg by mouth in the morning and take 1500 mg in the evening   methimazole 5 MG tablet Commonly known as: TAPAZOLE Take 1 tablet (5 mg total) by mouth daily. Hyperthyroidism: E05.90   metoprolol tartrate 25 MG tablet Commonly known as: LOPRESSOR Take 0.5 tablets (12.5 mg total) by mouth 2 (two) times daily. What changed: how much to take   multivitamin with minerals Tabs tablet Take 1 tablet by mouth daily. High potency   omeprazole 20 MG capsule Commonly known as: PRILOSEC Take 20 mg by mouth daily.   potassium chloride SA 20 MEQ tablet Commonly known as: KLOR-CON M Take 1 tablet (20 mEq total) by mouth daily.   QUEtiapine 50 MG tablet Commonly known as: SEROQUEL Take 50 mg by mouth at bedtime.   risperiDONE 1 MG tablet Commonly known as: RISPERDAL  Take 1 mg by mouth at bedtime.   Semaglutide(0.25 or 0.'5MG'$ /DOS) 2 MG/1.5ML Sopn Inject 0.5 mg into the skin every Monday.   vitamin B-12 500 MCG tablet Commonly known as: CYANOCOBALAMIN Take 500 mcg by mouth in the morning and at bedtime.        Discharge Assessment: Vitals:   10/14/21 2104 10/15/21 0444  BP: (!) 157/70 (!) 167/63  Pulse: 75 78  Resp: 18   Temp: (!) 97.5 F (36.4 C)   SpO2: 98% 97%   Skin clean, dry and intact without evidence of skin break down, no evidence of skin tears noted. IV catheter discontinued intact. Site without signs and symptoms of complications - no redness or edema noted at insertion  site, patient denies c/o pain - only slight tenderness at site.  Dressing with slight pressure applied.  D/c Instructions-Education: Discharge instructions given to patient/family with verbalized understanding. D/c education completed with patient/family including follow up instructions, medication list, d/c activities limitations if indicated, with other d/c instructions as indicated by MD - patient able to verbalize understanding, all questions fully answered. Patient instructed to return to ED, call 911, or call MD for any changes in condition.  Patient escorted via [place], and D/C home via private auto.  Alfonse Alpers, RN 10/15/2021 12:40 PM

## 2021-10-31 NOTE — Progress Notes (Signed)
Primary Care Physician:  [facility]  Primary GI: Dr. Gala Romney   Patient Location: Home   Provider Location: [facility] office   Reason for Visit: Follow-up    Persons present on the virtual encounter, with roles: Patient and NP   Total time (minutes) spent on medical discussion: 10 minutes   Due to COVID-19, visit was conducted using virtual method.  Visit was requested by patient.  Virtual Visit via MyChart Video Note Due to COVID-19, visit is conducted virtually and was requested by patient.   I connected with Darrell Lang on 11/13/21 at 10:00 AM EDT by video and verified that I am speaking with the correct person using two identifiers.   I discussed the limitations, risks, security and privacy concerns of performing an evaluation and management service by video and the availability of in person appointments. I also discussed with the patient that there may be a patient responsible charge related to this service. The patient expressed understanding and agreed to proceed.  Chief Complaint  Patient presents with   Follow-up     History of Present Illness: 76 year old male presenting today in follow-up with history of wew onset anemia in setting of chronic anticoagulation on Eliquis for afib. Colonoscopy and EGD both completed in interim from last visit.   Colonoscopy with one 15 mm adenoma and will need surveillance in 2026. EGD with gastritis and normal duodenum.   In August 2022, Hgb was 13. Hgb down to 9.9 in January 2022. Heme positive at that time. Hgb 11.8 in May 2023. Iron low at 9 in April 2023. Ferritin was 142, normal.   No overt GI bleeding. No abdominal pain, N/V, changes in bowel habits, constipation, diarrhea, overt GI bleeding, dysphagia, unexplained weight loss, lack of appetite, unexplained weight gain.     Past Medical History:  Diagnosis Date   Anxiety and depression    Atrial fibrillation with RVR ([place]) 02/08/2019   COVID-19  virus infection 02/09/2019   Diabetes ([place])    Encounter for screening colonoscopy 07/28/2013   Essential hypertension 02/09/2019   Family history of adverse reaction to anesthesia    Hyperlipidemia 02/09/2019   Hypertension    Hypertriglyceridemia      Past Surgical History:  Procedure Laterality Date   APPENDECTOMY     6th grade   COLONOSCOPY  01/14/2004   Dr. Gala Romney: internal hemorrhoids, otherwise normal rectum. Normal colon and normal TI   COLONOSCOPY N/A 08/06/2013   normal, internal hemorrhoids   COLONOSCOPY WITH PROPOFOL N/A 07/04/2021   fair prep, non-bleeding hemorrhoids, sigmoid diverticulosis, one 15 mm polyp in ascending colon s/p removal, 3 year surveillance (tubular adenoma)   ESOPHAGOGASTRODUODENOSCOPY (EGD) WITH PROPOFOL N/A 07/04/2021   gastritis, normal duodenum.   POLYPECTOMY  07/04/2021   Procedure: POLYPECTOMY;  Surgeon: [name], DO;  [facility];  Service: Endoscopy;;     Current Meds  Medication Sig   acetaminophen (TYLENOL) 325 MG tablet Take 2 tablets (650 mg total) by mouth every 6 (six) hours as needed for mild pain (or Fever >/= 101).   apixaban (ELIQUIS) 5 MG TABS tablet Take 1 tablet (5 mg total) by mouth 2 (two) times daily.   atorvastatin (LIPITOR) 10 MG tablet Take 10 mg by mouth daily.   benztropine (COGENTIN) 1 MG tablet Take 1 mg by mouth at bedtime.   busPIRone (BUSPAR) 5 MG tablet Take 2.5 mg by mouth daily.   carboxymethylcellulose (REFRESH PLUS)  0.5 % SOLN Place 1 drop into both eyes 2 (two) times daily as needed (dry eye).   Cholecalciferol 50 MCG (2000 UT) TABS Take 2,000 Units by mouth 3 (three) times a week.   citalopram (CELEXA) 20 MG tablet Take 20 mg by mouth daily.   finasteride (PROSCAR) 5 MG tablet Take 5 mg by mouth daily.   furosemide (LASIX) 40 MG tablet Take 0.5 tablets (20 mg total) by mouth every Monday, Wednesday, and Friday.   gabapentin (NEURONTIN) 300 MG capsule Take 300 mg by mouth 2 (two) times  daily.   glipiZIDE (GLUCOTROL) 5 MG tablet Take 2.5-5 mg by mouth See admin instructions. Take 5 mg in the morning and 2.5 mg every evening   Magnesium Oxide 420 MG TABS Take 420 mg by mouth daily.   metFORMIN (GLUCOPHAGE) 1000 MG tablet Take 1,000-1,500 mg by mouth See admin instructions. Take 1000 mg by mouth in the morning and take 1500 mg in the evening   methimazole (TAPAZOLE) 5 MG tablet Take 1 tablet (5 mg total) by mouth daily. Hyperthyroidism: E05.90   metoprolol tartrate (LOPRESSOR) 25 MG tablet Take 0.5 tablets (12.5 mg total) by mouth 2 (two) times daily.   Multiple Vitamin (MULTIVITAMIN WITH MINERALS) TABS tablet Take 1 tablet by mouth daily. High potency   omeprazole (PRILOSEC) 20 MG capsule Take 20 mg by mouth daily.   potassium chloride SA (KLOR-CON M) 20 MEQ tablet Take 1 tablet (20 mEq total) by mouth See admin instructions. On Mondays Wednesdays and Fridays only--take only when you take Lasix/furosemide   QUEtiapine (SEROQUEL) 50 MG tablet Take 50 mg by mouth at bedtime.   risperiDONE (RISPERDAL) 1 MG tablet Take 1 mg by mouth at bedtime.   Semaglutide,0.25 or 0.'5MG'$ /DOS, 2 MG/1.5ML SOPN Inject 0.5 mg into the skin every Monday.   vitamin B-12 (CYANOCOBALAMIN) 500 MCG tablet Take 500 mcg by mouth in the morning and at bedtime.     Family History  Problem Relation Age of Onset   Stroke Father    Colon cancer Neg Hx    Thyroid disease Neg Hx    Colon polyps Neg Hx     Social History   Socioeconomic History   Marital status: Married    Spouse name: Not on file   Number of children: Not on file   Years of education: Not on file   Highest education level: Not on file  Occupational History   Not on file  Tobacco Use   Smoking status: Former    Packs/day: 0.00    Years: 26.00    Total pack years: 0.00    Types: Cigarettes   Smokeless tobacco: Never   Tobacco comments:    Quit smoking in 1980s  Vaping Use   Vaping Use: Never used  Substance and Sexual Activity    Alcohol use: No    Comment: history of ETOH abuse in remote past, none currently.    Drug use: No   Sexual activity: Not on file  Other Topics Concern   Not on file  Social History Narrative   Not on file   Social Determinants of Health   Financial Resource Strain: Not on file  Food Insecurity: Not on file  Transportation Needs: Not on file  Physical Activity: Not on file  Stress: Not on file  Social Connections: Not on file       Review of Systems: Gen: Denies fever, chills, anorexia. Denies fatigue, weakness, weight loss.  CV: Denies chest pain, palpitations,  syncope, peripheral edema, and claudication. Resp: Denies dyspnea at rest, cough, wheezing, coughing up blood, and pleurisy. GI: see HPI Derm: Denies rash, itching, dry skin Psych: Denies depression, anxiety, memory loss, confusion. No homicidal or suicidal ideation.  Heme: Denies bruising, bleeding, and enlarged lymph nodes.  Observations/Objective: No distress. Unable to perform physical exam due to video encounter.   Assessment and Plan: 76 year old male presenting today in follow-up with history of wew onset anemia in setting of chronic anticoagulation on Eliquis for afib. Colonoscopy and EGD both completed in interim from last visit with large adenoma and EGD with gastritis.   Adenoma could be contributing to IDA; however, need to rule out small bowel source. Heme negative previously but no overt GI bleeding. He is on iron, which will be held 5 days prior to capsule study.  Recommending capsule study to exclude occult small bowel etiology such as AVMs in setting of anticoagulation.   Colonoscopy due in 2026 due to large adenoma.   Follow Up Instructions:    I discussed the assessment and treatment plan with the patient. The patient was provided an opportunity to ask questions and all were answered. The patient agreed with the plan and demonstrated an understanding of the instructions.   The patient was  advised to call back or seek an in-person evaluation if the symptoms worsen or if the condition fails to improve as anticipated.  I provided 10 minutes of face-to-face time during this MyChart Video encounter.  Annitta Needs, PhD, ANP-BC [facility]

## 2021-11-01 ENCOUNTER — Telehealth (INDEPENDENT_AMBULATORY_CARE_PROVIDER_SITE_OTHER): Payer: No Typology Code available for payment source | Admitting: Gastroenterology

## 2021-11-01 ENCOUNTER — Telehealth: Payer: Self-pay

## 2021-11-01 ENCOUNTER — Encounter: Payer: Self-pay | Admitting: Gastroenterology

## 2021-11-01 ENCOUNTER — Telehealth: Payer: Self-pay | Admitting: Gastroenterology

## 2021-11-01 VITALS — Ht 71.0 in | Wt 173.6 lb

## 2021-11-01 DIAGNOSIS — D649 Anemia, unspecified: Secondary | ICD-10-CM

## 2021-11-01 NOTE — Telephone Encounter (Signed)
.  RGA 

## 2021-11-01 NOTE — Patient Instructions (Signed)
We are arranging a capsule study in the near future.  Please stop iron 5 days prior.   Further recommendations to follow!  I enjoyed seeing you again today! As you know, I value our relationship and want to provide genuine, compassionate, and quality care. I welcome your feedback. If you receive a survey regarding your visit,  I greatly appreciate you taking time to fill this out. See you next time!  Annitta Needs, PhD, ANP-BC [facility]

## 2021-11-01 NOTE — Telephone Encounter (Signed)
RGA clinical pool:  Please arrange capsule study due to new onset anemia. Needs to hold iron 5 days prior. He is concerned about the [place] covering this?

## 2021-11-02 NOTE — Telephone Encounter (Signed)
[place] auth good until 12/20/21, auth# [id number]  Called pt and spoke with spouse. Scheduled for 7/6 at 7:30am. Aware will mail instructions and aware to hold iron as directed below.

## 2021-11-13 ENCOUNTER — Encounter: Payer: Self-pay | Admitting: Gastroenterology

## 2021-11-16 NOTE — Telephone Encounter (Signed)
Pt's wife LMOVM regarding that her husband does not take aspirin but he does take a blood thinner. She wants to know should he stop taking.

## 2021-11-16 NOTE — Telephone Encounter (Signed)
I spoke with spouse and made her aware of GIVENS instructions again.

## 2021-11-17 ENCOUNTER — Encounter: Payer: Self-pay | Admitting: Emergency Medicine

## 2021-11-17 ENCOUNTER — Ambulatory Visit (HOSPITAL_COMMUNITY)
Admission: RE | Admit: 2021-11-17 | Discharge: 2021-11-17 | Disposition: A | Discharge status: Home / Self Care | Payer: No Typology Code available for payment source | Attending: Internal Medicine | Admitting: Internal Medicine

## 2021-11-17 ENCOUNTER — Other Ambulatory Visit: Payer: Self-pay

## 2021-11-17 ENCOUNTER — Encounter (HOSPITAL_COMMUNITY)
Admission: RE | Disposition: A | Discharge status: Home / Self Care | Payer: Self-pay | Source: Home / Self Care | Attending: Internal Medicine

## 2021-11-17 ENCOUNTER — Ambulatory Visit
Admission: EM | Admit: 2021-11-17 | Discharge: 2021-11-17 | Disposition: A | Discharge status: Home / Self Care | Payer: No Typology Code available for payment source | Attending: Family Medicine | Admitting: Family Medicine

## 2021-11-17 DIAGNOSIS — D1330 Benign neoplasm of unspecified part of small intestine: Secondary | ICD-10-CM

## 2021-11-17 DIAGNOSIS — I4891 Unspecified atrial fibrillation: Secondary | ICD-10-CM | POA: Diagnosis not present

## 2021-11-17 DIAGNOSIS — R062 Wheezing: Secondary | ICD-10-CM

## 2021-11-17 DIAGNOSIS — D509 Iron deficiency anemia, unspecified: Secondary | ICD-10-CM | POA: Diagnosis present

## 2021-11-17 DIAGNOSIS — R509 Fever, unspecified: Secondary | ICD-10-CM | POA: Diagnosis not present

## 2021-11-17 DIAGNOSIS — R051 Acute cough: Secondary | ICD-10-CM

## 2021-11-17 DIAGNOSIS — Z7901 Long term (current) use of anticoagulants: Secondary | ICD-10-CM | POA: Diagnosis not present

## 2021-11-17 HISTORY — PX: GIVENS CAPSULE STUDY: SHX5432

## 2021-11-17 LAB — POCT URINALYSIS DIP (MANUAL ENTRY)
Bilirubin, UA: NEGATIVE
Blood, UA: NEGATIVE
Glucose, UA: NEGATIVE mg/dL
Ketones, POC UA: NEGATIVE mg/dL
Leukocytes, UA: NEGATIVE
Nitrite, UA: NEGATIVE
Protein Ur, POC: NEGATIVE mg/dL
Spec Grav, UA: 1.015 (ref 1.010–1.025)
Urobilinogen, UA: 0.2 E.U./dL
pH, UA: 7 (ref 5.0–8.0)

## 2021-11-17 SURGERY — IMAGING PROCEDURE, GI TRACT, INTRALUMINAL, VIA CAPSULE
Anesthesia: Monitor Anesthesia Care

## 2021-11-17 MED ORDER — ALBUTEROL SULFATE (2.5 MG/3ML) 0.083% IN NEBU
2.5000 mg | INHALATION_SOLUTION | Freq: Once | RESPIRATORY_TRACT | Status: AC
  Administered 2021-11-17: 2.5 mg via RESPIRATORY_TRACT

## 2021-11-17 MED ORDER — ALBUTEROL SULFATE HFA 108 (90 BASE) MCG/ACT IN AERS: 1.0000 | INHALATION_SPRAY | RESPIRATORY_TRACT | 0 refills | Status: AC | PRN

## 2021-11-17 MED ORDER — DOXYCYCLINE HYCLATE 100 MG PO CAPS: 100.0000 mg | ORAL_CAPSULE | Freq: Two times a day (BID) | ORAL | 0 refills | Status: AC

## 2021-11-17 MED ORDER — GUAIFENESIN ER 600 MG PO TB12: 600.0000 mg | ORAL_TABLET | Freq: Two times a day (BID) | ORAL | 0 refills | Status: AC | PRN

## 2021-11-17 MED ORDER — DEXAMETHASONE SODIUM PHOSPHATE 10 MG/ML IJ SOLN
10.0000 mg | Freq: Once | INTRAMUSCULAR | Status: AC
  Administered 2021-11-17: 10 mg via INTRAMUSCULAR

## 2021-11-17 NOTE — ED Triage Notes (Addendum)
Pt reports fever since this evening, cough with phlegm, and reports urinary frequency for last several days.

## 2021-11-18 ENCOUNTER — Encounter (HOSPITAL_COMMUNITY): Payer: Self-pay | Admitting: Internal Medicine

## 2021-11-18 LAB — NOVEL CORONAVIRUS, NAA: SARS-CoV-2, NAA: NOT DETECTED

## 2021-11-21 NOTE — ED Provider Notes (Signed)
[facility]    CSN: [id number] Arrival date & time: 11/17/21  1913      History   Chief Complaint Chief Complaint  Patient presents with   Fever    HPI Darrell Lang is a 76 y.o. male.   Presenting today with 1 day history of congestion, cough, wheezing, chest tightness, urinary frequency, and fever that came on about 2 hours ago. Denies CP, abdominal pain, N/V/D, sore throat, known sick contacts. Does have a hx of UTIs so wanting to rule this out. No known hx of pulmonary dz, not trying anything OTC for sxs. Does have a hx of CHF and was admitted 2 months ago for exacerbation and pulmonary edema, and has hx of atrial fibrillation on eliquis and metoprolol.     Past Medical History:  Diagnosis Date   Anxiety and depression    Atrial fibrillation with RVR ([place]) 02/08/2019   COVID-19 virus infection 02/09/2019   Diabetes ([place])    Encounter for screening colonoscopy 07/28/2013   Essential hypertension 02/09/2019   Family history of adverse reaction to anesthesia    Hyperlipidemia 02/09/2019   Hypertension    Hypertriglyceridemia     Patient Active Problem List   Diagnosis Date Noted   Orthostatic hypotension 54/65/0354   Acute diastolic CHF (congestive heart failure) ([place]) 09/25/2021   Acute CHF (congestive heart failure) ([place]) 09/24/2021   Enlarged prostate with urinary obstruction 09/24/2021   Acute respiratory failure with hypoxia ([place]) 09/24/2021   Hyponatremia 09/24/2021   Sepsis ([place]) 08/19/2021   Sepsis secondary to UTI ([place]) 08/19/2021   Acute lower respiratory infection 08/19/2021   Atrial fibrillation, chronic ([place]) 08/19/2021   Normocytic anemia 06/23/2021   Heme positive stool 06/23/2021   Hyperthyroidism 03/11/2019   COVID-19 virus infection 02/09/2019   Essential hypertension 02/09/2019   Hyperlipidemia 02/09/2019   Diabetes ([place]) 02/09/2019   Atrial fibrillation with RVR ([place]) 02/08/2019   Encounter for screening colonoscopy  07/28/2013    Past Surgical History:  Procedure Laterality Date   APPENDECTOMY     6th grade   COLONOSCOPY  01/14/2004   Dr. Gala Romney: internal hemorrhoids, otherwise normal rectum. Normal colon and normal TI   COLONOSCOPY N/A 08/06/2013   normal, internal hemorrhoids   COLONOSCOPY WITH PROPOFOL N/A 07/04/2021   fair prep, non-bleeding hemorrhoids, sigmoid diverticulosis, one 15 mm polyp in ascending colon s/p removal, 3 year surveillance (tubular adenoma)   ESOPHAGOGASTRODUODENOSCOPY (EGD) WITH PROPOFOL N/A 07/04/2021   gastritis, normal duodenum.   GIVENS CAPSULE STUDY N/A 11/17/2021   Procedure: GIVENS CAPSULE STUDY;  Surgeon: Daneil Dolin, MD;  [facility];  Service: Endoscopy;  Laterality: N/A;  7:30am   POLYPECTOMY  07/04/2021   Procedure: POLYPECTOMY;  Surgeon: [name], DO;  [facility];  Service: Endoscopy;;       Home Medications    Prior to Admission medications   Medication Sig Start Date End Date Taking? Authorizing Provider  albuterol (VENTOLIN HFA) 108 (90 Base) MCG/ACT inhaler Inhale 1-2 puffs into the lungs every 4 (four) hours as needed for wheezing or shortness of breath. 11/17/21  Yes Volney American, PA-C  doxycycline (VIBRAMYCIN) 100 MG capsule Take 1 capsule (100 mg total) by mouth 2 (two) times daily. 11/17/21  Yes Volney American, PA-C  guaiFENesin (MUCINEX) 600 MG 12 hr tablet Take 1 tablet (600 mg total) by mouth 2 (two) times daily as needed. 11/17/21  Yes Volney American, PA-C  acetaminophen (TYLENOL) 325 MG  tablet Take 2 tablets (650 mg total) by mouth every 6 (six) hours as needed for mild pain (or Fever >/= 101). 10/15/21   Roxan Hockey, MD  apixaban (ELIQUIS) 5 MG TABS tablet Take 1 tablet (5 mg total) by mouth 2 (two) times daily. 02/24/19   Nahser, Wonda Cheng, MD  atorvastatin (LIPITOR) 10 MG tablet Take 10 mg by mouth daily. 06/21/20   [provider]  benztropine (COGENTIN) 1 MG tablet Take 1  mg by mouth at bedtime.    [provider]  busPIRone (BUSPAR) 5 MG tablet Take 2.5 mg by mouth daily. 09/21/20   [provider]  carboxymethylcellulose (REFRESH PLUS) 0.5 % SOLN Place 1 drop into both eyes 2 (two) times daily as needed (dry eye). 04/11/21   [provider]  Cholecalciferol 50 MCG (2000 UT) TABS Take 2,000 Units by mouth 3 (three) times a week. 06/21/20   [provider]  citalopram (CELEXA) 20 MG tablet Take 20 mg by mouth daily.    [provider]  finasteride (PROSCAR) 5 MG tablet Take 5 mg by mouth daily. 06/21/20   [provider]  furosemide (LASIX) 40 MG tablet Take 0.5 tablets (20 mg total) by mouth every Monday, Wednesday, and Friday. 10/17/21 10/17/22  Roxan Hockey, MD  gabapentin (NEURONTIN) 300 MG capsule Take 300 mg by mouth 2 (two) times daily. 05/11/20   [provider]  glipiZIDE (GLUCOTROL) 5 MG tablet Take 2.5-5 mg by mouth See admin instructions. Take 5 mg in the morning and 2.5 mg every evening    [provider]  Magnesium Oxide 420 MG TABS Take 420 mg by mouth daily. 12/19/20   [provider]  metFORMIN (GLUCOPHAGE) 1000 MG tablet Take 1,000-1,500 mg by mouth See admin instructions. Take 1000 mg by mouth in the morning and take 1500 mg in the evening    [provider]  methimazole (TAPAZOLE) 5 MG tablet Take 1 tablet (5 mg total) by mouth daily. Hyperthyroidism: E05.90 07/01/19   Renato Shin, MD  metoprolol tartrate (LOPRESSOR) 25 MG tablet Take 0.5 tablets (12.5 mg total) by mouth 2 (two) times daily. 10/15/21   Roxan Hockey, MD  Multiple Vitamin (MULTIVITAMIN WITH MINERALS) TABS tablet Take 1 tablet by mouth daily. High potency    [provider]  omeprazole (PRILOSEC) 20 MG capsule Take 20 mg by mouth daily.    [provider]  potassium chloride SA (KLOR-CON M) 20 MEQ tablet Take 1 tablet (20 mEq total) by mouth See admin instructions. On Mondays  Wednesdays and Fridays only--take only when you take Lasix/furosemide 10/15/21   Roxan Hockey, MD  QUEtiapine (SEROQUEL) 50 MG tablet Take 50 mg by mouth at bedtime.    [provider]  risperiDONE (RISPERDAL) 1 MG tablet Take 1 mg by mouth at bedtime.    [provider]  Semaglutide,0.25 or 0.'5MG'$ /DOS, 2 MG/1.5ML SOPN Inject 0.5 mg into the skin every Monday. 05/03/21   [provider]  vitamin B-12 (CYANOCOBALAMIN) 500 MCG tablet Take 500 mcg by mouth in the morning and at bedtime. 06/21/20   [provider]    Family History Family History  Problem Relation Age of Onset   Stroke Father    Colon cancer Neg Hx    Thyroid disease Neg Hx    Colon polyps Neg Hx     Social History Social History   Tobacco Use   Smoking status: Former    Packs/day: 0.00    Years: 26.00  Total pack years: 0.00    Types: Cigarettes   Smokeless tobacco: Never   Tobacco comments:    Quit smoking in 1980s  Vaping Use   Vaping Use: Never used  Substance Use Topics   Alcohol use: No    Comment: history of ETOH abuse in remote past, none currently.    Drug use: No     Allergies   Empagliflozin, Haldol [haloperidol], Lisinopril, and Sulfamethoxazole-trimethoprim   Review of Systems Review of Systems PER HPI  Physical Exam Triage Vital Signs ED Triage Vitals  Enc Vitals Group     BP 11/17/21 1917 (!) 159/72     Pulse Rate 11/17/21 1917 (!) 101     Resp 11/17/21 1917 (!) 22     Temp 11/17/21 1917 98.5 F (36.9 C)     Temp Source 11/17/21 1917 Oral     SpO2 11/17/21 1917 92 %     Weight --      Height --      Head Circumference --      Peak Flow --      Pain Score 11/17/21 1918 0     Pain Loc --      Pain Edu? --      Excl. in [place]? --    No data found.  Updated Vital Signs BP (!) 159/72 (BP Location: Right Arm)   Pulse 98   Temp 98.5 F (36.9 C) (Oral)   Resp (!) 22   SpO2 96%   Visual Acuity Right Eye Distance:   Left Eye Distance:    Bilateral Distance:    Right Eye Near:   Left Eye Near:    Bilateral Near:     Physical Exam Vitals and nursing note reviewed.  Constitutional:      Appearance: He is well-developed.  HENT:     Head: Atraumatic.     Right Ear: External ear normal.     Left Ear: External ear normal.     Nose: Rhinorrhea present.     Mouth/Throat:     Mouth: Mucous membranes are moist.     Pharynx: Posterior oropharyngeal erythema present. No oropharyngeal exudate.  Eyes:     Conjunctiva/sclera: Conjunctivae normal.     Pupils: Pupils are equal, round, and reactive to light.  Cardiovascular:     Rate and Rhythm: Normal rate.  Pulmonary:     Effort: Pulmonary effort is normal. No respiratory distress.     Breath sounds: Wheezing present. No rales.     Comments: Moderate to severe wheezes b/l. Breathing comfortably on room air, speaking in full sentences.  Musculoskeletal:        General: Normal range of motion.     Cervical back: Normal range of motion and neck supple.  Lymphadenopathy:     Cervical: No cervical adenopathy.  Skin:    General: Skin is warm and dry.  Neurological:     Mental Status: He is alert and oriented to person, place, and time.  Psychiatric:        Behavior: Behavior normal.    UC Treatments / Results  Labs (all labs ordered are listed, but only abnormal results are displayed) Labs Reviewed  NOVEL CORONAVIRUS, NAA   Narrative:    Performed at:  [address], [place], [place]  [id number] Lab Director: Rush Farmer MD, Phone:  [phone]  POCT URINALYSIS DIP (MANUAL ENTRY)    EKG   Radiology No results found.  Procedures Procedures (including critical care  time)  Medications Ordered in UC Medications  albuterol (PROVENTIL) (2.5 MG/3ML) 0.083% nebulizer solution 2.5 mg (2.5 mg Nebulization Given 11/17/21 1947)  dexamethasone (DECADRON) injection 10 mg (10 mg Intramuscular Given 11/17/21 1947)    Initial Impression / Assessment  and Plan / UC Course  I have reviewed the triage vital signs and the nursing notes.  Pertinent labs & imaging results that were available during my care of the patient were reviewed by me and considered in my medical decision making (see chart for details).     Significant improvement on post albuterol neb auscultation, and O2 saturation rose from 91% to 96% on room air. Given significant pulmonary sxs will cover for possible lower resp infection and wheezing with IM decadron, doxycycline, albuterol inhaler and await COVID results. Discussed supportive care, strict return precautions.   Final Clinical Impressions(s) / UC Diagnoses   Final diagnoses:  Fever, unspecified  Acute cough  Wheezing   Discharge Instructions   None    ED Prescriptions     Medication Sig Dispense Auth. Provider   doxycycline (VIBRAMYCIN) 100 MG capsule Take 1 capsule (100 mg total) by mouth 2 (two) times daily. 14 capsule Volney American, [place]   albuterol (VENTOLIN HFA) 108 (90 Base) MCG/ACT inhaler Inhale 1-2 puffs into the lungs every 4 (four) hours as needed for wheezing or shortness of breath. 18 g Volney American, PA-C   guaiFENesin (MUCINEX) 600 MG 12 hr tablet Take 1 tablet (600 mg total) by mouth 2 (two) times daily as needed. 30 tablet Volney American, [place]      PDMP not reviewed this encounter.   Volney American, [place] 11/21/21 2132

## 2021-11-23 NOTE — Op Note (Addendum)
Small Bowel [place] Capsule Study Procedure date:  11/17/2021  Referring Provider:  Gerrit Friends. Minnette Merida, MD PCP:  Dr. Skipper Cliche, [facility]  Indication for procedure:  Iron deficiency anemia  Patient data:  Wt: 78.7 kg Ht: 5\' 11"   Findings: Study complete to the cecum.  Two angioectasias found in the small bowel. One adenomas 38 minutes into the study with a second found about 42 minutes into the study.  Small black spot noticed about an hour 57 minutes into the study.         First Gastric image:  00:00:01 First Duodenal image: 00:13:19 First Ileo-Cecal Valve image: Unable to view First Cecal image: 05:15:39 Gastric Passage time: 0h 63m 18s Small Bowel Passage time:  5h 38m  Summary & Recommendations: Darrell Lang is a 76 year old male with recent history of new onset anemia in the setting of chronic anticoagulation with Eliquis for A-fib.  He has had colonoscopy and EGD performed in February 2023.  Colonoscopy revealed 15 mm adenoma.  EGD with gastritis and normal duodenum.   Now with capsule endoscopy performed revealing a couple of angiectasias vs erosions in the small bowel just a little over 35 and 42 minutes into the study which could be contributing to his iron deficiency on anticoagulation.    Given that he has not exhibited any signs of overt GI bleeding he should continue on daily oral iron supplementation and monitor for signs of bleeding including melena or hematochezia. Would recommend that he continue daily PPI therapy.    Brooke Bonito, MSN, FNP-BC, AGACNP-BC [facility]  Attending note: Pertinent images reviewed.

## 2021-11-24 ENCOUNTER — Telehealth: Payer: Self-pay | Admitting: Gastroenterology

## 2021-11-24 NOTE — Telephone Encounter (Signed)
Darrell Lang or Darrell Lang - in Darrell Lang's absence can one of you call the patient with the results below:  Capsule study performed revealing a couple of angiectasias (AVM) in the small bowel just a little over 35 and 42 minutes into the study which could be contributing to his iron deficiency on anticoagulation. These are growths of dilated blood vessels that can occur in the mucosa of the GI tract and can cause intermittent bleeding.   Will discuss findings with Dr. Gala Romney and determine if these areas can be reached with push enteroscopy to possibly be treated with APC therapy.  He should continue on daily oral iron supplementation and monitor for signs of overt GI bleeding including dark stools or bright red blood per rectum. We will be in touch with further recommendations.  Venetia Night, MSN, FNP-BC, AGACNP-BC [facility]

## 2021-11-25 NOTE — Telephone Encounter (Signed)
FYI  Phoned and advised the pt's wife of the result note. She expressed understanding and will wait to hear back from [place] with further recommendations. Pt also made aware of iron and to monitor for signs of GI bleed.

## 2021-11-25 NOTE — Telephone Encounter (Signed)
Phoned and LMOVM for the pt to return call 

## 2021-11-29 NOTE — Telephone Encounter (Signed)
Phoned and advised the pt of the recommendations to continue the PPI meds and oral iron, to continue to monitor his for blood. Pt was advised of a follow up in 3 to 4 months that is needed whether virtual or in office. Pt expressed understanding.  STACEY: pt needs a 3 or 4 month follow up appt with Venetia Night

## 2021-12-01 NOTE — Telephone Encounter (Signed)
Noted  

## 2021-12-08 ENCOUNTER — Other Ambulatory Visit: Payer: Self-pay | Admitting: Family Medicine

## 2021-12-08 NOTE — Telephone Encounter (Signed)
Requested Prescriptions  Pending Prescriptions Disp Refills  . albuterol (VENTOLIN HFA) 108 (90 Base) MCG/ACT inhaler [Pharmacy Med Name: ALBUTEROL HFA (VENTOLIN) INH] 18 each     Sig: INHALE 1-2 PUFFS INTO THE LUNGS EVERY 4 HOURS AS NEEDED FOR WHEEZING OR SHORTNESS OF BREATH.     There is no refill protocol information for this order

## 2021-12-12 ENCOUNTER — Telehealth: Payer: Self-pay

## 2021-12-12 NOTE — Telephone Encounter (Signed)
Pt's wife called and LMOVM. I returned the pt's call and was advised by the pt's wife that his hemoglobin is 10. They stated they were advised to contact us with these results. Also the pt was advised to go to the ED if he had bleeding. Well pt has had and is having bleeding and I advised to go to the ED as last instructed. Wife stated she just wanted to let us know. So whether they follow up at ED I don't know. (Also I know you are on PAL today. We will talk tomorow

## 2021-12-13 NOTE — Telephone Encounter (Signed)
What kind of bleeding? Any black stools? Bright red blood? Is it stopped now?   He was to have follow-up with Loma Sousa in a few months. Let's go ahead and have him come in to see her in next 2 weeks.

## 2021-12-14 NOTE — Telephone Encounter (Signed)
OV made °

## 2021-12-14 NOTE — Telephone Encounter (Signed)
Phoned and spoke with the pt's wife and was advised there has not been any bleeding. She was just letting us know of the pt's Hgb being 10. Pt was advised by me that day if he did have bleeding to go to the ED. The pt was also advised of his appt with Venetia Night on 8/24th @ 9 am. They expressed understanding

## 2022-01-04 NOTE — Progress Notes (Unsigned)
GI Office Note    Referring Provider: [facility] Primary Care Physician:  [facility] Gastroenterologist: Dr. Gala Romney  Date:  01/05/2022  ID:  Darrell, Lang 14-Oct-1945, MRN [id number]   Chief Complaint   Chief Complaint  Patient presents with   hemoglobin low    History of Present Illness  Darrell Lang is a 76 y.o. male with a history of anemia in the setting of chronic anticoagulation with Eliquis for A-fib presenting today for follow-up.  He is a colonoscopy and EGD completed in February 2023 with single 15 mm adenoma on colonoscopy, due for surveillance in 2026, and EGD with gastritis and normal duodenum.  In August 2022, hemoglobin was 13, and drifted to 9.9 in January 2023.  He was heme positive at that time.  In May 2023 hemoglobin 11.8.  Of note his iron was also low in April 2023 at 9 with ferritin normal at 142.  Last video visit in June 2023 with no complaints of any overt GI bleeding.  He also denied abdominal pain, nausea/vomiting, change in bowel habits, constipation, diarrhea, dysphagia, unexplained weight loss, good appetite, unexplained weight gain.  He underwent [facility] capsule endoscopy in July 2023 revealing a few AVM versus erosions in the small bowel with no significant lesion about 42 minutes into the study which could possibly be contributing to his IDA however he continued to not have any signs of overt GI bleeding.  There was no evidence of cancer throughout the small bowel.  Per discussion with Dr. Gala Romney he agrees with following clinically given no active symptoms at this time.  He was advised to continue oral iron supplementation.  12/12/2021, patient's wife called to advise that his hemoglobin was 10 as previously instructed to do so.  It appears it may have been some miscommunication about whether or not the patient was having any GI bleeding and patient was recommended to follow-up sooner than already scheduled 3  months.  Today:  Denies any melena or hematochezia. Gets blood work checked frequent. Wife [name]) thinks the heart doctor may talk to them about an ablation and coming off of anticoagulation. Has an appointment next week. Denies any abdominal pain. Does have some occasional diarrhea (about twice a week and may ago about 2 times that day). Denies abdominal swelling (Lasix 3 times a week). Denies chest pain, shortness of breath. Denies lack of appetite, early satiety, or any other upper GI symptoms.   Wife requested more information on causes of low hemoglobin, ongoing treatment, and monitoring.    Current Outpatient Medications  Medication Sig Dispense Refill   acetaminophen (TYLENOL) 325 MG tablet Take 2 tablets (650 mg total) by mouth every 6 (six) hours as needed for mild pain (or Fever >/= 101). 12 tablet 0   albuterol (VENTOLIN HFA) 108 (90 Base) MCG/ACT inhaler Inhale 1-2 puffs into the lungs every 4 (four) hours as needed for wheezing or shortness of breath. 18 g 0   apixaban (ELIQUIS) 5 MG TABS tablet Take 1 tablet (5 mg total) by mouth 2 (two) times daily. 180 tablet 3   atorvastatin (LIPITOR) 10 MG tablet Take 10 mg by mouth daily.     benztropine (COGENTIN) 1 MG tablet Take 1 mg by mouth at bedtime.     busPIRone (BUSPAR) 5 MG tablet Take 2.5 mg by mouth daily.     carboxymethylcellulose (REFRESH PLUS) 0.5 % SOLN Place 1 drop into both eyes 2 (two) times daily as needed (dry  eye).     Cholecalciferol 50 MCG (2000 UT) TABS Take 2,000 Units by mouth 3 (three) times a week.     citalopram (CELEXA) 20 MG tablet Take 20 mg by mouth daily.     doxycycline (VIBRAMYCIN) 100 MG capsule Take 1 capsule (100 mg total) by mouth 2 (two) times daily. 14 capsule 0   finasteride (PROSCAR) 5 MG tablet Take 5 mg by mouth daily.     furosemide (LASIX) 40 MG tablet Take 0.5 tablets (20 mg total) by mouth every Monday, Wednesday, and Friday. 15 tablet 3   gabapentin (NEURONTIN) 300 MG capsule Take 300 mg  by mouth 2 (two) times daily.     glipiZIDE (GLUCOTROL) 5 MG tablet Take 2.5-5 mg by mouth See admin instructions. Take 5 mg in the morning and 2.5 mg every evening     guaiFENesin (MUCINEX) 600 MG 12 hr tablet Take 1 tablet (600 mg total) by mouth 2 (two) times daily as needed. 30 tablet 0   Magnesium Oxide 420 MG TABS Take 420 mg by mouth daily.     metFORMIN (GLUCOPHAGE) 1000 MG tablet Take 1,000-1,500 mg by mouth See admin instructions. Take 1000 mg by mouth in the morning and take 1500 mg in the evening     methimazole (TAPAZOLE) 5 MG tablet Take 1 tablet (5 mg total) by mouth daily. Hyperthyroidism: E05.90 90 tablet 0   metoprolol tartrate (LOPRESSOR) 25 MG tablet Take 0.5 tablets (12.5 mg total) by mouth 2 (two) times daily. 45 tablet 2   Multiple Vitamin (MULTIVITAMIN WITH MINERALS) TABS tablet Take 1 tablet by mouth daily. High potency     omeprazole (PRILOSEC) 20 MG capsule Take 20 mg by mouth daily.     potassium chloride SA (KLOR-CON M) 20 MEQ tablet Take 1 tablet (20 mEq total) by mouth See admin instructions. On Mondays Wednesdays and Fridays only--take only when you take Lasix/furosemide 30 tablet 1   QUEtiapine (SEROQUEL) 50 MG tablet Take 50 mg by mouth at bedtime.     risperiDONE (RISPERDAL) 1 MG tablet Take 1 mg by mouth at bedtime.     Semaglutide,0.25 or 0.'5MG'$ /DOS, 2 MG/1.5ML SOPN Inject 0.5 mg into the skin every Monday.     vitamin B-12 (CYANOCOBALAMIN) 500 MCG tablet Take 500 mcg by mouth in the morning and at bedtime.     No current facility-administered medications for this visit.    Past Medical History:  Diagnosis Date   Anxiety and depression    Atrial fibrillation with RVR ([place]) 02/08/2019   COVID-19 virus infection 02/09/2019   Diabetes ([place])    Encounter for screening colonoscopy 07/28/2013   Essential hypertension 02/09/2019   Family history of adverse reaction to anesthesia    Hyperlipidemia 02/09/2019   Hypertension    Hypertriglyceridemia     Past  Surgical History:  Procedure Laterality Date   APPENDECTOMY     6th grade   COLONOSCOPY  01/14/2004   Dr. Gala Romney: internal hemorrhoids, otherwise normal rectum. Normal colon and normal TI   COLONOSCOPY N/A 08/06/2013   normal, internal hemorrhoids   COLONOSCOPY WITH PROPOFOL N/A 07/04/2021   fair prep, non-bleeding hemorrhoids, sigmoid diverticulosis, one 15 mm polyp in ascending colon s/p removal, 3 year surveillance (tubular adenoma)   ESOPHAGOGASTRODUODENOSCOPY (EGD) WITH PROPOFOL N/A 07/04/2021   gastritis, normal duodenum.   GIVENS CAPSULE STUDY N/A 11/17/2021   Procedure: GIVENS CAPSULE STUDY;  Surgeon: Daneil Dolin, MD;  Location: [facility];  Service: Endoscopy;  Laterality: N/A;  7:30am   POLYPECTOMY  07/04/2021   Procedure: POLYPECTOMY;  Surgeon: [name], DO;  Location: [facility];  Service: Endoscopy;;    Family History  Problem Relation Age of Onset   Stroke Father    Colon cancer Neg Hx    Thyroid disease Neg Hx    Colon polyps Neg Hx     Allergies as of 01/05/2022 - Review Complete 01/05/2022  Allergen Reaction Noted   Empagliflozin Other (See Comments) 06/01/2020   Haldol [haloperidol] Other (See Comments) 06/27/2021   Lisinopril Cough 08/24/2009   Sulfamethoxazole-trimethoprim Other (See Comments) 05/29/2011    Social History   Socioeconomic History   Marital status: Married    Spouse name: Not on file   Number of children: Not on file   Years of education: Not on file   Highest education level: Not on file  Occupational History   Not on file  Tobacco Use   Smoking status: Former    Packs/day: 0.00    Years: 26.00    Total pack years: 0.00    Types: Cigarettes   Smokeless tobacco: Never   Tobacco comments:    Quit smoking in 1980s  Vaping Use   Vaping Use: Never used  Substance and Sexual Activity   Alcohol use: No    Comment: history of ETOH abuse in remote past, none currently.    Drug use: No   Sexual activity: Not on file   Other Topics Concern   Not on file  Social History Narrative   Not on file   Social Determinants of Health   Financial Resource Strain: Not on file  Food Insecurity: Not on file  Transportation Needs: Not on file  Physical Activity: Not on file  Stress: Not on file  Social Connections: Not on file     Review of Systems   Gen: Denies fever, chills, anorexia. Denies fatigue, weakness, weight loss.  CV: Denies chest pain, palpitations, syncope, peripheral edema, and claudication. Resp: Denies dyspnea at rest, cough, wheezing, coughing up blood, and pleurisy. GI: See HPI Derm: Denies rash, itching, dry skin Psych: Denies depression, anxiety, memory loss, confusion. No homicidal or suicidal ideation.  Heme: Denies bruising, bleeding, and enlarged lymph nodes.   Physical Exam   BP 131/69 (BP Location: Right Arm, Patient Position: Sitting, Cuff Size: Normal)   Pulse 96   Temp 97.9 F (36.6 C) (Temporal)   Ht '5\' 11"'$  (1.803 m)   Wt 181 lb 9.6 oz (82.4 kg)   SpO2 97%   BMI 25.33 kg/m   General:   Alert and oriented. No distress noted. Pleasant and cooperative.  Head:  Normocephalic and atraumatic. Eyes:  Conjuctiva clear without scleral icterus. Glasses present. Mouth:  Oral mucosa pink and moist. Good dentition. No lesions. Lungs:  Clear to auscultation bilaterally. No wheezes, rales, or rhonchi. No distress.  Heart:  irregularly irregular Abdomen:  +BS, soft, non-tender and non-distended. No rebound or guarding. No HSM or masses noted. Rectal: deferred Msk:  Symmetrical without gross deformities. Normal posture. Extremities:  Without edema. Neurologic:  Alert and  oriented x4 Psych:  Alert and cooperative. Normal mood and affect.   Assessment  Darrell Lang is a 76 y.o. male with a history of anemia in the setting of chronic anticoagulation with Eliquis for A-fib presenting today for follow-up.   Iron deficiency anemia: Present in the setting of chronic  anticoagulation with Eliquis for A-fib.  EGD earlier this year with gastritis and normal duodenum.  Colonoscopy at that time with 15 mm adenoma.  Will be due for surveillance in 2026.  Capsule endoscopy performed in July noting a few small AVMs versus erosions in the small bowel.  Currently hemoglobin appears to be stable in the 10-11 range, recently checked the end of July.  He is maintained on daily PPI and previously advised oral over-the-counter iron supplementation.  Iron low at 9 with normal ferritin in April 2023.  Should recheck hemoglobin and iron panel and follow-up in 3-4 months.  Continue to monitor for any melena or hematochezia.  He may continue to have ongoing anemia in the setting of chronic anticoagulation. Wife and patient satisfied with education provided today.   PLAN    Hemoglobin and iron panel in 3-4 months.  Continue over-the-counter oral iron three times daily.  Continue to monitor for signs of GI bleeding. Follow up in 6 months    Venetia Night, MSN, FNP-BC, AGACNP-BC [facility]

## 2022-01-05 ENCOUNTER — Ambulatory Visit (INDEPENDENT_AMBULATORY_CARE_PROVIDER_SITE_OTHER): Payer: Medicare Other | Admitting: Gastroenterology

## 2022-01-05 ENCOUNTER — Other Ambulatory Visit: Payer: Self-pay | Admitting: *Deleted

## 2022-01-05 ENCOUNTER — Encounter: Payer: Self-pay | Admitting: Gastroenterology

## 2022-01-05 VITALS — BP 131/69 | HR 96 | Temp 97.9°F | Ht 71.0 in | Wt 181.6 lb

## 2022-01-05 DIAGNOSIS — D509 Iron deficiency anemia, unspecified: Secondary | ICD-10-CM

## 2022-01-05 DIAGNOSIS — D649 Anemia, unspecified: Secondary | ICD-10-CM

## 2022-01-05 NOTE — Patient Instructions (Addendum)
Please continue to monitor your stools for any black/tarry stools or bright red blood.  If any of these become present please contact our office for an appointment.  With history of known AVMs or erosions in the small bowel we may potentially be able to do a procedure called an enteroscopy.  We will put you on recall to have your hemoglobin and iron panel rechecked in 3 to 4 months.  If you have this blood work done at the [place] just prior to that timeframe, please contact the office and let us know.  Please continue to take your iron 3 days a week.  We will see you in 6 months!  Please enjoy the rest of your summer and the holidays!  I want to create trusting relationships with patients. If you receive a survey regarding your visit,  I greatly appreciate you taking time to fill this out on paper or through your MyChart. I value your feedback.  Venetia Night, MSN, FNP-BC, AGACNP-BC [facility]

## 2022-03-14 ENCOUNTER — Encounter: Payer: Self-pay | Admitting: *Deleted

## 2022-03-15 ENCOUNTER — Telehealth: Payer: Self-pay | Admitting: *Deleted

## 2022-03-15 ENCOUNTER — Other Ambulatory Visit: Payer: Self-pay | Admitting: *Deleted

## 2022-03-15 DIAGNOSIS — D649 Anemia, unspecified: Secondary | ICD-10-CM

## 2022-03-15 NOTE — Telephone Encounter (Signed)
Mailed lab requisitions

## 2022-04-04 ENCOUNTER — Telehealth: Payer: Self-pay | Admitting: *Deleted

## 2022-04-04 NOTE — Telephone Encounter (Signed)
Pt's wife [name] [facility]) called and states pt will have labs completed at the [place] in December. Informed her to have [facility] fax results to office or she could bring a copy.

## 2022-05-23 ENCOUNTER — Ambulatory Visit (INDEPENDENT_AMBULATORY_CARE_PROVIDER_SITE_OTHER): Payer: Medicare Other | Admitting: Gastroenterology

## 2022-05-23 ENCOUNTER — Encounter: Payer: Self-pay | Admitting: Gastroenterology

## 2022-05-23 VITALS — BP 136/69 | HR 76 | Temp 97.8°F | Ht 71.0 in | Wt 189.8 lb

## 2022-05-23 DIAGNOSIS — D649 Anemia, unspecified: Secondary | ICD-10-CM

## 2022-05-23 DIAGNOSIS — D509 Iron deficiency anemia, unspecified: Secondary | ICD-10-CM | POA: Diagnosis not present

## 2022-05-23 NOTE — Patient Instructions (Signed)
Please begin taking your iron once daily.  May take with food to prevent any nausea.  To help with exertion sometimes it is better to take with vitamin C.  Monitor for any constipation as frequent iron can cause this.  If you begin to have any constipation you may take an over-the-counter stool softener or MiraLAX as needed.  I would like to recheck your hemoglobin and iron panel in 3 months.  If you end up having to have this performed at the [facility] please let us know and fax [place] those results.  If at the time he received lab slips you are not due for any lab work and please have them performed.  We will see you in clinic in 6 months, or sooner if needed.  Please let me know if you develop any black/tarry stools or bright red blood in your stools or any worsening fatigue, shortness of breath, dizziness, etc.  It was a pleasure to see you today. I want to create trusting relationships with patients. If you receive a survey regarding your visit,  I greatly appreciate you taking time to fill this out on paper or through your MyChart. I value your feedback.  Venetia Night, MSN, FNP-BC, AGACNP-BC [facility]

## 2022-05-23 NOTE — Progress Notes (Signed)
GI Office Note    Referring Provider: [facility] Care Physician:  [facility] Gastroenterologist: Cristopher Estimable.Rourk, MD  Date:  05/23/2022  ID:  Darrell Lang, DOB 07-05-1945, MRN [id number]   Chief Complaint   Chief Complaint  Patient presents with   Follow-up    Anemia     History of Present Illness  Darrell Lang is a 77 y.o. male with a history of anemia in the setting of chronic anticoagulation with Eliquis for A-fib presenting today for follow-up.  Hemoglobin 13 in August 2022 and directed to 9.9 in January 2023.  Stool heme positive at that time.  May 2023 hemoglobin 11.8.  Iron also low in April 2023 at 9 with ferritin normal at 142.  EGD February 2023: -Gastritis -Normal duodenum  Colonoscopy February 2023: -15 mm adenoma -Due for repeat in 2026  Givens capsule in July 2023 revealing few AVMs versus erosions in the small bowel with no significant lesion about 42 minutes into the study which could possibly could be contributing to his IDA in the setting of anticoagulation.  Advised to continue on oral iron supplementation.  Last office visit 01/05/2022. No overt GI bleeding.  Having frequent blood work checked.  Possibly having ablation for A-fib, off anticoagulation.  No abdominal pain.  Occasional diarrhea.  No abdominal swelling or peripheral swelling.  Takes Lasix 3 times per week.  Denies lack appetite, early satiety, or any other upper GI symptoms.  Education provided on low hemoglobin.  Advised to check iron and hemoglobin levels in 3-4 months.  Continue oral iron 3 times daily.   Labs 05/02/2022: Hemoglobin 11.4, MCV 84, platelets 272.  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 Taking oral iron 3 days per week. Having dark stools but no brbpr or melena. No abdominal pain, dysphagia, GERD, nausea. Has a good appetite. Weight has been  stable.   This week had a couple episodes of vomiting that occur out of the blue and some occasional diarrhea.  No specific food triggers.  Has been having a lot of sinus drainage, wife suspect this is the cause of his vomiting.   Current Outpatient Medications  Medication Sig Dispense Refill   albuterol (VENTOLIN HFA) 108 (90 Base) MCG/ACT inhaler Inhale 1-2 puffs into the lungs every 4 (four) hours as needed for wheezing or shortness of breath. 18 g 0   apixaban (ELIQUIS) 5 MG TABS tablet Take 1 tablet (5 mg total) by mouth 2 (two) times daily. 180 tablet 3   atorvastatin (LIPITOR) 10 MG tablet Take 10 mg by mouth daily.     benztropine (COGENTIN) 1 MG tablet Take 1 mg by mouth at bedtime.     carboxymethylcellulose (REFRESH PLUS) 0.5 % SOLN Place 1 drop into both eyes 2 (two) times daily as needed (dry eye).     citalopram (CELEXA) 20 MG tablet Take 20 mg by mouth daily.     ferrous sulfate 325 (65 FE) MG EC tablet Take 325 mg by mouth 3 (three) times a week.     furosemide (LASIX) 40 MG tablet Take 0.5 tablets (20 mg total) by mouth every Monday, Wednesday, and Friday. 15 tablet 3   gabapentin (NEURONTIN) 300 MG capsule Take 300 mg by mouth 2 (two) times daily.     Magnesium Oxide 420 MG TABS Take 420 mg by mouth daily.     metFORMIN (  GLUCOPHAGE) 1000 MG tablet Take 1,000-1,500 mg by mouth See admin instructions. Take 1000 mg by mouth in the morning and take 1500 mg in the evening     metoprolol tartrate (LOPRESSOR) 25 MG tablet Take 0.5 tablets (12.5 mg total) by mouth 2 (two) times daily. 45 tablet 2   Multiple Vitamin (MULTIVITAMIN WITH MINERALS) TABS tablet Take 1 tablet by mouth daily. High potency     omeprazole (PRILOSEC) 20 MG capsule Take 20 mg by mouth daily.     potassium chloride SA (KLOR-CON M) 20 MEQ tablet Take 1 tablet (20 mEq total) by mouth See admin instructions. On Mondays Wednesdays and Fridays only--take only when you take Lasix/furosemide 30 tablet 1   QUEtiapine  (SEROQUEL) 50 MG tablet Take 50 mg by mouth at bedtime.     risperiDONE (RISPERDAL) 1 MG tablet Take 1 mg by mouth at bedtime.     Semaglutide,0.25 or 0.'5MG'$ /DOS, 2 MG/1.5ML SOPN Inject 0.5 mg into the skin every Monday.     silodosin (RAPAFLO) 4 MG CAPS capsule Take 1 capsule by mouth daily.     vitamin B-12 (CYANOCOBALAMIN) 500 MCG tablet Take 500 mcg by mouth in the morning and at bedtime.     acetaminophen (TYLENOL) 325 MG tablet Take 2 tablets (650 mg total) by mouth every 6 (six) hours as needed for mild pain (or Fever >/= 101). (Patient not taking: Reported on 01/05/2022) 12 tablet 0   busPIRone (BUSPAR) 5 MG tablet Take 2.5 mg by mouth daily. (Patient not taking: Reported on 05/23/2022)     Cholecalciferol 50 MCG (2000 UT) TABS Take 2,000 Units by mouth 3 (three) times a week. (Patient not taking: Reported on 05/23/2022)     doxycycline (VIBRAMYCIN) 100 MG capsule Take 1 capsule (100 mg total) by mouth 2 (two) times daily. (Patient not taking: Reported on 01/05/2022) 14 capsule 0   guaiFENesin (MUCINEX) 600 MG 12 hr tablet Take 1 tablet (600 mg total) by mouth 2 (two) times daily as needed. (Patient not taking: Reported on 01/05/2022) 30 tablet 0   methimazole (TAPAZOLE) 5 MG tablet Take 1 tablet (5 mg total) by mouth daily. Hyperthyroidism: E05.90 90 tablet 0   No current facility-administered medications for this visit.    Past Medical History:  Diagnosis Date   Anxiety and depression    Atrial fibrillation with RVR ([place]) 02/08/2019   COVID-19 virus infection 02/09/2019   Diabetes ([place])    Encounter for screening colonoscopy 07/28/2013   Essential hypertension 02/09/2019   Family history of adverse reaction to anesthesia    Hyperlipidemia 02/09/2019   Hypertension    Hypertriglyceridemia     Past Surgical History:  Procedure Laterality Date   APPENDECTOMY     6th grade   COLONOSCOPY  01/14/2004   Dr. Gala Romney: internal hemorrhoids, otherwise normal rectum. Normal colon and normal TI    COLONOSCOPY N/A 08/06/2013   normal, internal hemorrhoids   COLONOSCOPY WITH PROPOFOL N/A 07/04/2021   fair prep, non-bleeding hemorrhoids, sigmoid diverticulosis, one 15 mm polyp in ascending colon s/p removal, 3 year surveillance (tubular adenoma)   ESOPHAGOGASTRODUODENOSCOPY (EGD) WITH PROPOFOL N/A 07/04/2021   gastritis, normal duodenum.   GIVENS CAPSULE STUDY N/A 11/17/2021   Procedure: GIVENS CAPSULE STUDY;  Surgeon: Daneil Dolin, MD;  Location: [facility];  Service: Endoscopy;  Laterality: N/A;  7:30am   POLYPECTOMY  07/04/2021   Procedure: POLYPECTOMY;  Surgeon: [name], DO;  Location: [facility];  Service: Endoscopy;;  Family History  Problem Relation Age of Onset   Stroke Father    Colon cancer Neg Hx    Thyroid disease Neg Hx    Colon polyps Neg Hx     Allergies as of 05/23/2022 - Review Complete 05/23/2022  Allergen Reaction Noted   Empagliflozin Other (See Comments) 06/01/2020   Haldol [haloperidol] Other (See Comments) 06/27/2021   Lisinopril Cough 08/24/2009   Sulfamethoxazole-trimethoprim Other (See Comments) 05/29/2011    Social History   Socioeconomic History   Marital status: Married    Spouse name: Not on file   Number of children: Not on file   Years of education: Not on file   Highest education level: Not on file  Occupational History   Not on file  Tobacco Use   Smoking status: Former    Packs/day: 0.00    Years: 26.00    Total pack years: 0.00    Types: Cigarettes   Smokeless tobacco: Never   Tobacco comments:    Quit smoking in 1980s  Vaping Use   Vaping Use: Never used  Substance and Sexual Activity   Alcohol use: No    Comment: history of ETOH abuse in remote past, none currently.    Drug use: No   Sexual activity: Not on file  Other Topics Concern   Not on file  Social History Narrative   Not on file   Social Determinants of Health   Financial Resource Strain: Not on file  Food Insecurity: Not on file   Transportation Needs: Not on file  Physical Activity: Not on file  Stress: Not on file  Social Connections: Not on file     Review of Systems   Gen: Denies fever, chills, anorexia. Denies fatigue, weakness, weight loss.  CV: Denies chest pain, palpitations, syncope, peripheral edema, and claudication. Resp: Denies dyspnea at rest, cough, wheezing, coughing up blood, and pleurisy. GI: See HPI Derm: Denies rash, itching, dry skin Psych: Denies depression, anxiety, memory loss, confusion. No homicidal or suicidal ideation.  Heme: Denies bruising, bleeding, and enlarged lymph nodes.   Physical Exam   BP 136/69 (BP Location: Right Arm, Patient Position: Sitting, Cuff Size: Normal)   Pulse 76   Temp 97.8 F (36.6 C) (Temporal)   Ht '5\' 11"'$  (1.803 m)   Wt 189 lb 12.8 oz (86.1 kg)   SpO2 98%   BMI 26.47 kg/m   General:   Alert and oriented. No distress noted. Pleasant and cooperative.  Head:  Normocephalic and atraumatic. Eyes:  Conjuctiva clear without scleral icterus. Lungs:  Clear to auscultation bilaterally. No wheezes, rales, or rhonchi. No distress.  Heart: Irregularly irregular.  Rate controlled. Abdomen:  +BS, soft, non-tender and non-distended. No rebound or guarding. No HSM or masses noted. Rectal: deferred Msk:  Symmetrical without gross deformities. Normal posture. Extremities:  Without edema. Neurologic:  Alert and  oriented x4 Psych:  Alert and cooperative. Normal mood and affect.   Assessment  Darrell Lang is a 77 y.o. male with a history of anemia in setting of chronic anticoagulation with Eliquis for A-fib presenting today for follow-up.  Iron deficiency anemia: Complete endoscopic workup as outlined in HPI including capsule endoscopy revealing a few AVMs versus erosions within the small bowel without significant evidence of bleeding.  Continues to deny any melena or BRBPR.  Has continued on Eliquis for A-fib.  Reports a fairly good energy level and no  alarm symptoms.  Denies any lack of appetite, early satiety, dysphagia, GERD, nausea.  Does have some occasional diarrhea that is not significant.  Currently taking iron 3 times weekly (M, W, F).  Recent hemoglobin 11.4 (10 in July 2023).  Improvement in iron from 9 to 43 and saturation from 3% to 9% from April 2023 to December 2023.  Will continue to monitor for now but advised increasing iron from 3 times weekly to daily and will repeat labs in 3 months.  Patient aware to notify clinic if he develops any signs of GI bleeding.  PLAN   Increase iron to once daily from 3 times weekly.  Repeat CBC and iron panel in 3 months Monitor for bleeding.  May use over-the-counter stool softener or laxative as needed if constipation occurs with increase in iron. Follow up in 6 months.     Venetia Night, MSN, FNP-BC, AGACNP-BC [facility]

## 2022-08-15 ENCOUNTER — Telehealth: Payer: Self-pay | Admitting: *Deleted

## 2022-08-15 NOTE — Telephone Encounter (Signed)
Mailed lab requisitions.  

## 2022-08-29 LAB — IRON,TIBC AND FERRITIN PANEL
Ferritin: 21 ng/mL — ABNORMAL LOW (ref 30–400)
Iron Saturation: 24 % (ref 15–55)
Iron: 91 ug/dL (ref 38–169)
Total Iron Binding Capacity: 374 ug/dL (ref 250–450)
UIBC: 283 ug/dL (ref 111–343)

## 2022-08-29 LAB — HEMOGLOBIN: Hemoglobin: 12.2 g/dL — ABNORMAL LOW (ref 13.0–17.7)

## 2022-10-20 ENCOUNTER — Encounter: Payer: Self-pay | Admitting: Internal Medicine

## 2024-02-11 IMAGING — CT CT HEAD W/O CM
4 series · 17 of 47 positions shown, 19 images · non-contrast
Comparison: None Available.

CLINICAL DATA: Head trauma, minor (Age >= 65y)



[Series 2: head w o · axial · 0.44mm/px · z∈[+21,+146]mm · 7 of 35 slices shown, 9 images]
[im 5/35  brain]
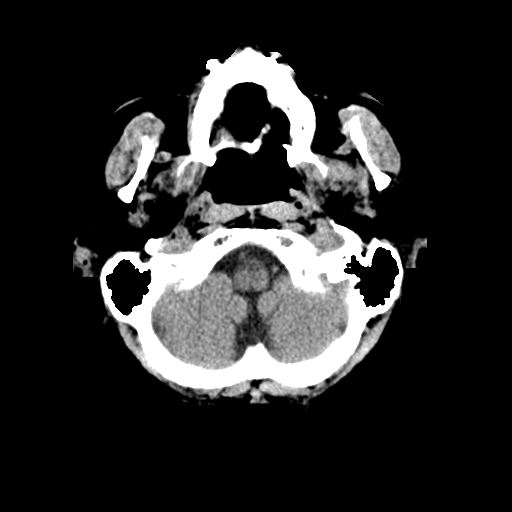
[im 5/35  bone]
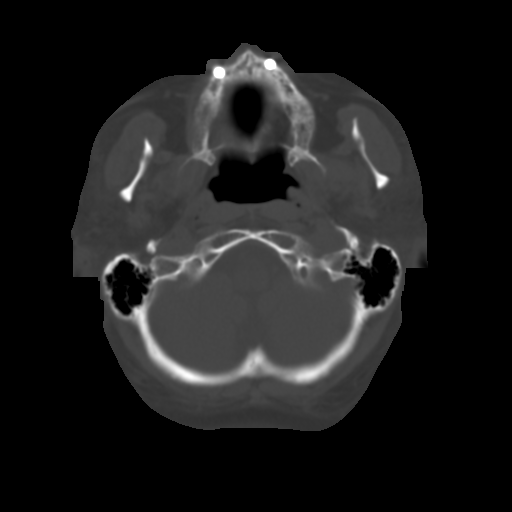
[im 9/35  brain]
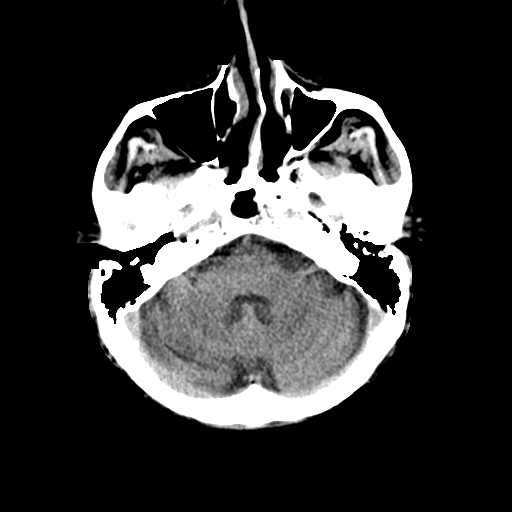
[im 13/35  brain]
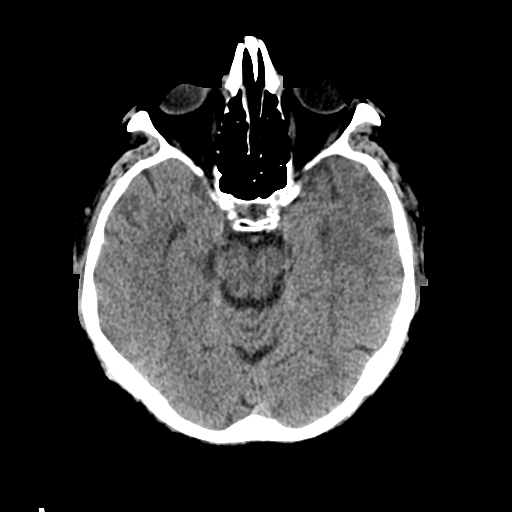
[im 18/35  brain]
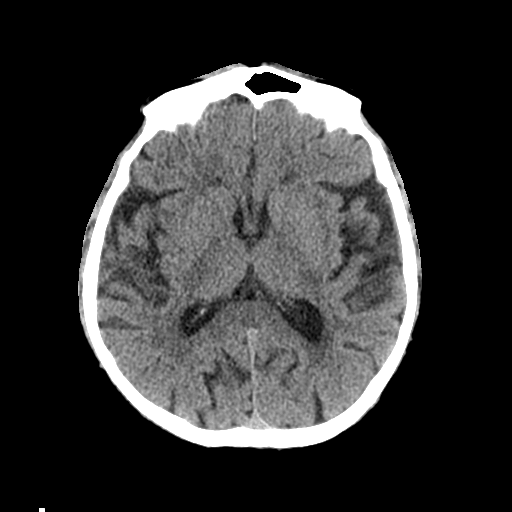
[im 22/35  brain]
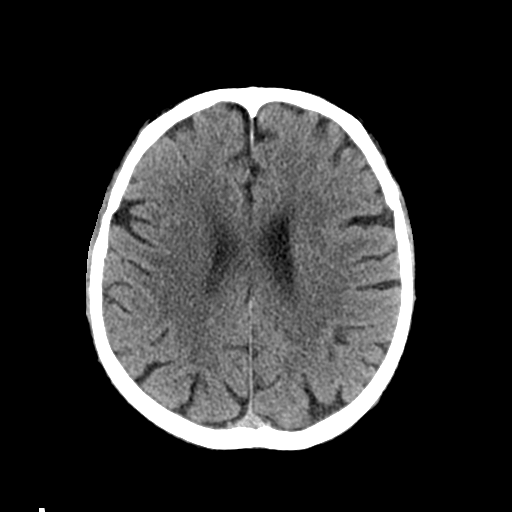
[im 22/35  bone]
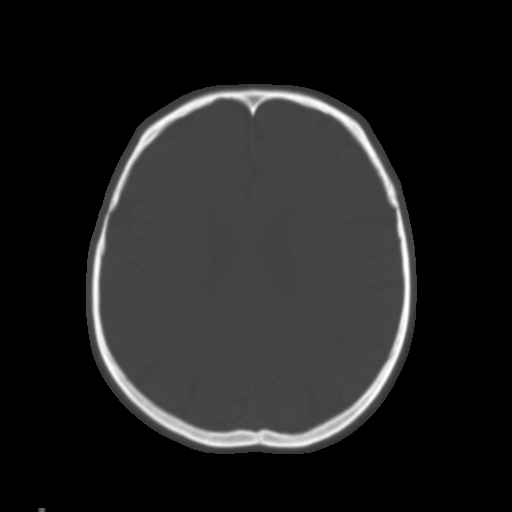
[im 26/35  brain]
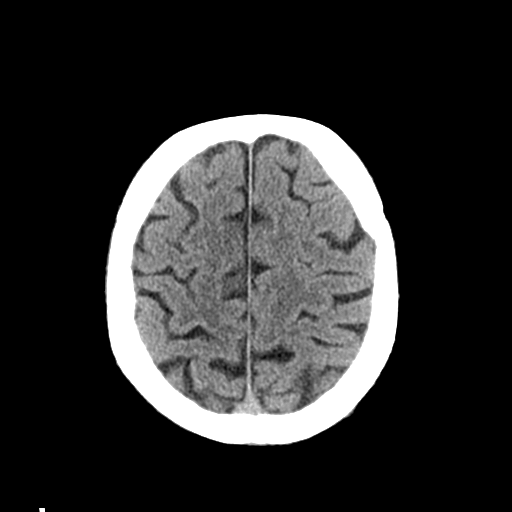
[im 30/35  brain]
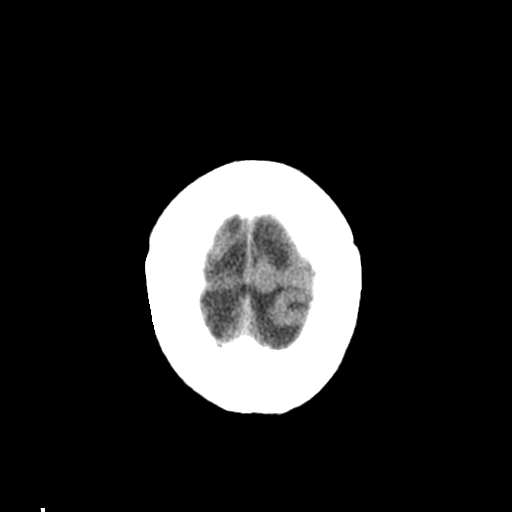

[Series 3: head bone · axial · 0.44mm/px · z∈[+17,+77]mm · 4 of 86 slices shown]
[im 9/86  bone]
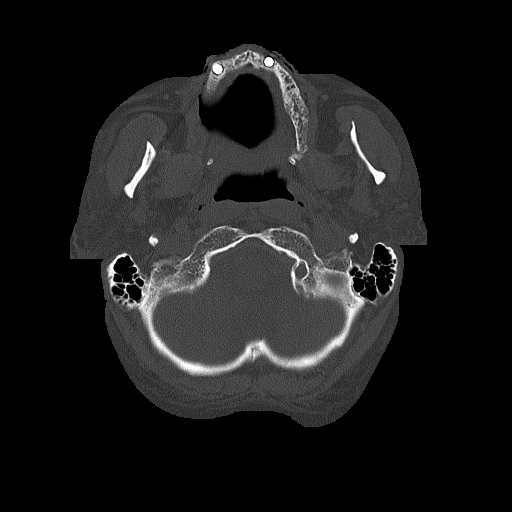
[im 18/86  bone]
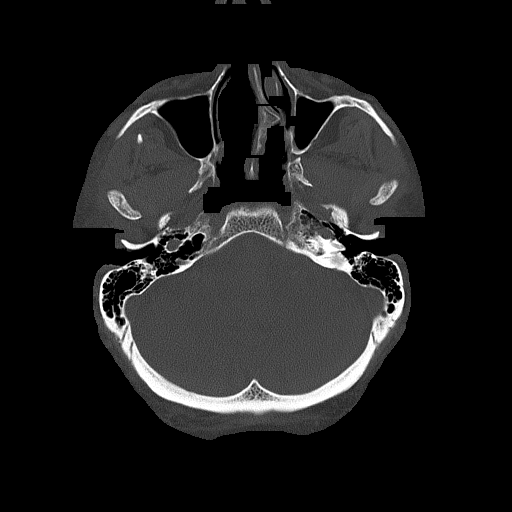
[im 26/86  bone]
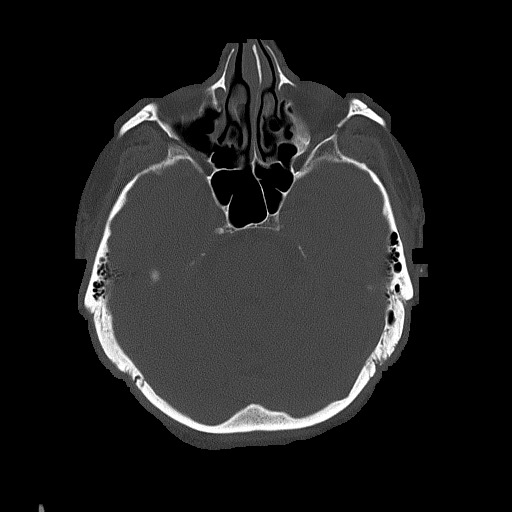
[im 39/86  bone]
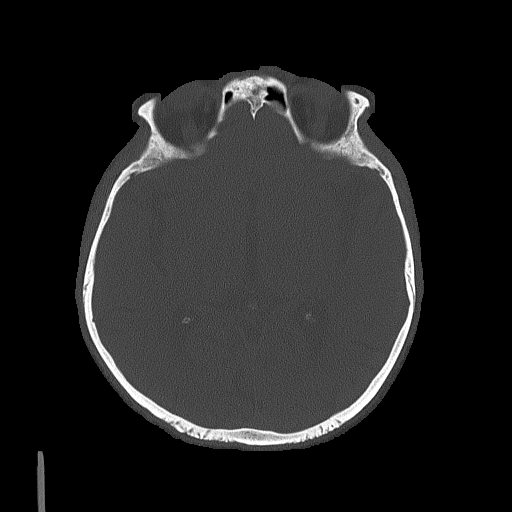

[Series 4: coronal soft · coronal · 0.36mm/px · 3 of 69 slices shown]
[im 23/69  brain]
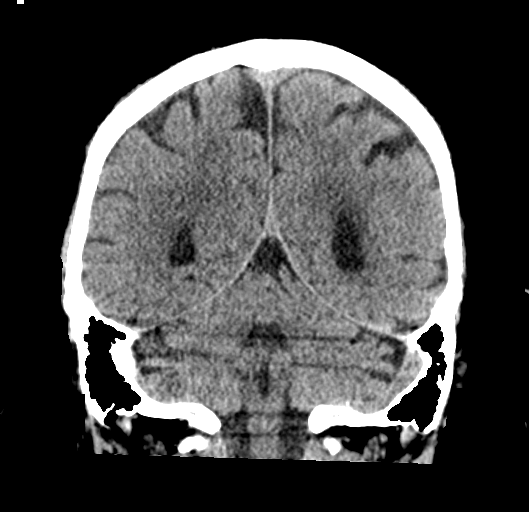
[im 31/69  brain]
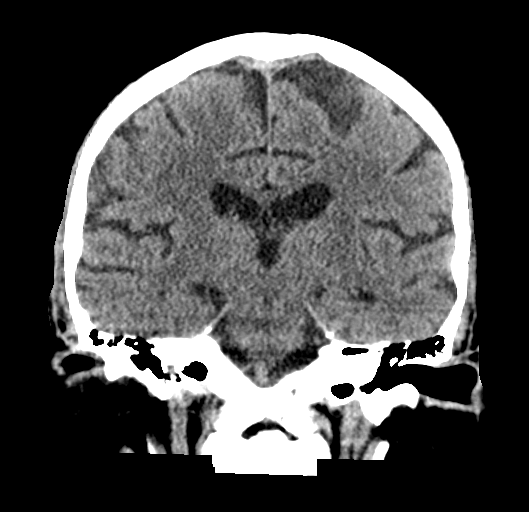
[im 38/69  brain]
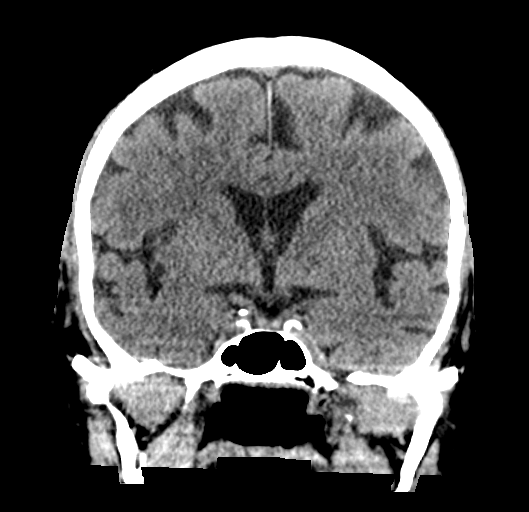

[Series 5: sagittal soft · sagittal · 0.36mm/px · 3 of 65 slices shown]
[im 22/65  brain]
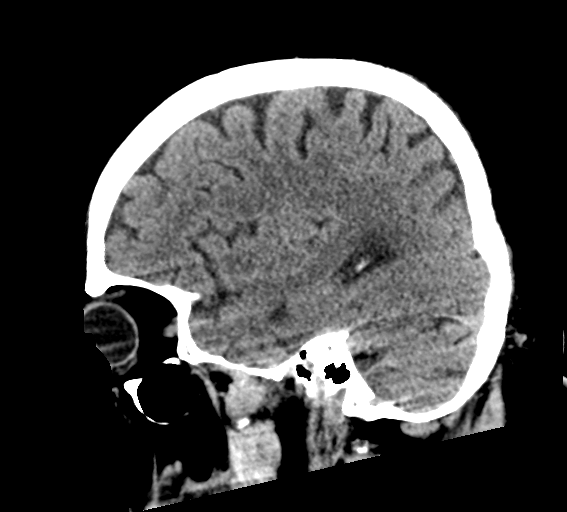
[im 33/65  brain]
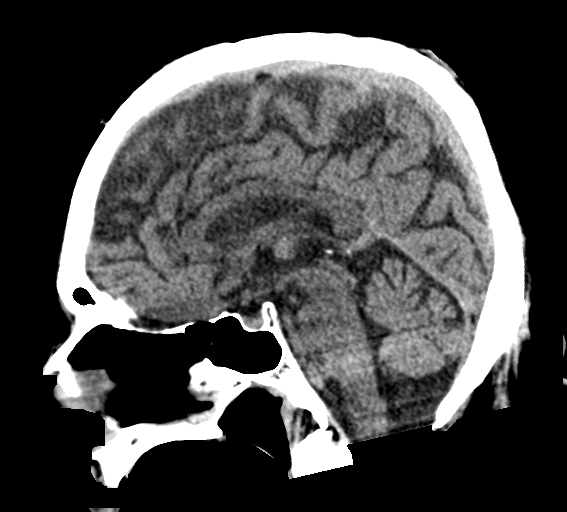
[im 43/65  brain]
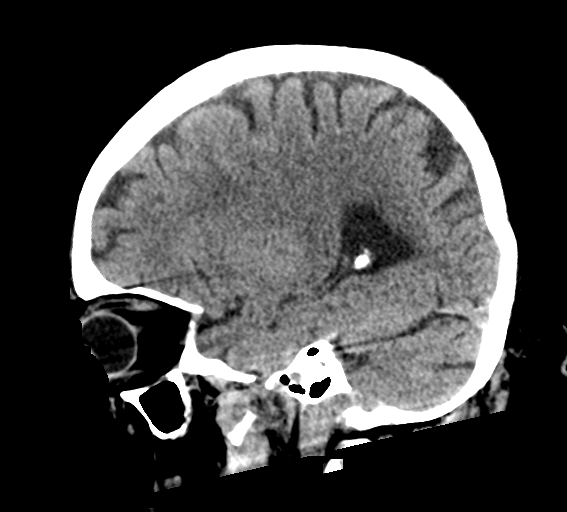

[17 of 47 positions shown; findings below may reference images not displayed]

FINDINGS: Brain: No evidence of acute infarction, hemorrhage, hydrocephalus,
extra-axial collection or mass lesion/mass effect. Scattered
low-density changes within the periventricular and subcortical white
matter compatible with chronic microvascular ischemic change. Mild
diffuse cerebral volume loss.

Vascular: Atherosclerotic calcifications involving the large vessels
of the skull base. No unexpected hyperdense vessel.

Skull: Normal. Negative for fracture or focal lesion.

Sinuses/Orbits: Paranasal sinuses and mastoid air cells are clear.

Other: None.
IMPRESSION: 1. No acute intracranial abnormality.
2. Mild chronic microvascular ischemic change and cerebral volume
loss.

## 2024-05-29 ENCOUNTER — Encounter (INDEPENDENT_AMBULATORY_CARE_PROVIDER_SITE_OTHER): Payer: Self-pay | Admitting: *Deleted

## 2024-08-04 ENCOUNTER — Ambulatory Visit: Admitting: Gastroenterology
